# Patient Record
Sex: Female | Born: 1975 | Race: Black or African American | Hispanic: No | Marital: Single | Smoking: Never smoker
Health system: Southern US, Community
[De-identification: ages and names within clinical notes are randomized; demographics above are authoritative.]

## PROBLEM LIST (undated history)

## (undated) DIAGNOSIS — E559 Vitamin D deficiency, unspecified: Secondary | ICD-10-CM

## (undated) DIAGNOSIS — K429 Umbilical hernia without obstruction or gangrene: Secondary | ICD-10-CM

## (undated) DIAGNOSIS — F329 Major depressive disorder, single episode, unspecified: Secondary | ICD-10-CM

## (undated) DIAGNOSIS — R87619 Unspecified abnormal cytological findings in specimens from cervix uteri: Secondary | ICD-10-CM

## (undated) DIAGNOSIS — R7303 Prediabetes: Secondary | ICD-10-CM

## (undated) DIAGNOSIS — R51 Headache: Secondary | ICD-10-CM

## (undated) DIAGNOSIS — N841 Polyp of cervix uteri: Secondary | ICD-10-CM

## (undated) DIAGNOSIS — G43909 Migraine, unspecified, not intractable, without status migrainosus: Secondary | ICD-10-CM

## (undated) DIAGNOSIS — F32A Depression, unspecified: Secondary | ICD-10-CM

## (undated) DIAGNOSIS — O1490 Unspecified pre-eclampsia, unspecified trimester: Secondary | ICD-10-CM

## (undated) DIAGNOSIS — R569 Unspecified convulsions: Secondary | ICD-10-CM

## (undated) DIAGNOSIS — IMO0002 Reserved for concepts with insufficient information to code with codable children: Secondary | ICD-10-CM

## (undated) DIAGNOSIS — N390 Urinary tract infection, site not specified: Secondary | ICD-10-CM

## (undated) HISTORY — PX: TUBAL LIGATION: SHX77

## (undated) HISTORY — DX: Depression, unspecified: F32.A

## (undated) HISTORY — DX: Major depressive disorder, single episode, unspecified: F32.9

## (undated) HISTORY — DX: Prediabetes: R73.03

## (undated) HISTORY — DX: Morbid (severe) obesity due to excess calories: E66.01

## (undated) HISTORY — DX: Migraine, unspecified, not intractable, without status migrainosus: G43.909

## (undated) HISTORY — PX: CRYOTHERAPY: SHX1416

## (undated) HISTORY — DX: Vitamin D deficiency, unspecified: E55.9

## (undated) HISTORY — PX: ENDOMETRIAL ABLATION: SHX621

## (undated) HISTORY — PX: WISDOM TOOTH EXTRACTION: SHX21

---
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
## 2000-05-11 ENCOUNTER — Encounter: Payer: Self-pay | Admitting: *Deleted

## 2000-05-11 ENCOUNTER — Ambulatory Visit (HOSPITAL_COMMUNITY): Admission: RE | Admit: 2000-05-11 | Discharge: 2000-05-11 | Payer: Self-pay | Admitting: *Deleted

## 2000-07-12 ENCOUNTER — Inpatient Hospital Stay (HOSPITAL_COMMUNITY): Admission: AD | Admit: 2000-07-12 | Discharge: 2000-07-20 | Payer: Self-pay | Admitting: *Deleted

## 2000-07-12 ENCOUNTER — Encounter (INDEPENDENT_AMBULATORY_CARE_PROVIDER_SITE_OTHER): Payer: Self-pay

## 2000-07-12 HISTORY — PX: TUBAL LIGATION: SHX77

## 2000-07-13 ENCOUNTER — Encounter: Payer: Self-pay | Admitting: *Deleted

## 2000-08-17 ENCOUNTER — Inpatient Hospital Stay (HOSPITAL_COMMUNITY): Admission: AD | Admit: 2000-08-17 | Discharge: 2000-08-17 | Payer: Self-pay | Admitting: *Deleted

## 2000-09-29 ENCOUNTER — Ambulatory Visit (HOSPITAL_COMMUNITY): Admission: RE | Admit: 2000-09-29 | Discharge: 2000-09-29 | Payer: Self-pay | Admitting: *Deleted

## 2001-01-09 ENCOUNTER — Emergency Department (HOSPITAL_COMMUNITY): Admission: EM | Admit: 2001-01-09 | Discharge: 2001-01-09 | Payer: Self-pay | Admitting: Emergency Medicine

## 2001-05-25 ENCOUNTER — Emergency Department (HOSPITAL_COMMUNITY): Admission: EM | Admit: 2001-05-25 | Discharge: 2001-05-25 | Payer: Self-pay | Admitting: Emergency Medicine

## 2001-05-25 ENCOUNTER — Encounter: Payer: Self-pay | Admitting: Emergency Medicine

## 2001-08-10 ENCOUNTER — Other Ambulatory Visit: Admission: RE | Admit: 2001-08-10 | Discharge: 2001-08-10 | Payer: Self-pay | Admitting: *Deleted

## 2002-07-30 ENCOUNTER — Other Ambulatory Visit: Admission: RE | Admit: 2002-07-30 | Discharge: 2002-07-30 | Payer: Self-pay | Admitting: Family Medicine

## 2002-09-21 ENCOUNTER — Emergency Department (HOSPITAL_COMMUNITY): Admission: EM | Admit: 2002-09-21 | Discharge: 2002-09-21 | Payer: Self-pay | Admitting: Emergency Medicine

## 2002-09-21 ENCOUNTER — Encounter: Payer: Self-pay | Admitting: Emergency Medicine

## 2004-08-27 ENCOUNTER — Emergency Department (HOSPITAL_COMMUNITY): Admission: EM | Admit: 2004-08-27 | Discharge: 2004-08-27 | Payer: Self-pay | Admitting: Family Medicine

## 2004-10-09 ENCOUNTER — Emergency Department (HOSPITAL_COMMUNITY): Admission: EM | Admit: 2004-10-09 | Discharge: 2004-10-09 | Payer: Self-pay | Admitting: Family Medicine

## 2004-10-09 ENCOUNTER — Ambulatory Visit (HOSPITAL_COMMUNITY): Admission: RE | Admit: 2004-10-09 | Discharge: 2004-10-09 | Payer: Self-pay | Admitting: Family Medicine

## 2005-04-27 ENCOUNTER — Emergency Department (HOSPITAL_COMMUNITY): Admission: EM | Admit: 2005-04-27 | Discharge: 2005-04-27 | Payer: Self-pay | Admitting: Family Medicine

## 2005-07-12 HISTORY — PX: WISDOM TOOTH EXTRACTION: SHX21

## 2006-07-06 ENCOUNTER — Encounter: Admission: RE | Admit: 2006-07-06 | Discharge: 2006-07-06 | Payer: Self-pay | Admitting: Family Medicine

## 2006-08-04 ENCOUNTER — Encounter: Admission: RE | Admit: 2006-08-04 | Discharge: 2006-08-04 | Payer: Self-pay | Admitting: Family Medicine

## 2006-09-23 ENCOUNTER — Ambulatory Visit (HOSPITAL_COMMUNITY): Admission: RE | Admit: 2006-09-23 | Discharge: 2006-09-23 | Payer: Self-pay | Admitting: Obstetrics and Gynecology

## 2006-09-23 ENCOUNTER — Encounter (INDEPENDENT_AMBULATORY_CARE_PROVIDER_SITE_OTHER): Payer: Self-pay | Admitting: Specialist

## 2007-06-02 IMAGING — US US ABDOMEN COMPLETE
1 series · 14 of 25 positions shown · non-contrast
Comparison: none

CLINICAL DATA: Upper abdominal bloating.  
ABDOMEN ULTRASOUND:
TECHNIQUE: Complete abdominal ultrasound examination was performed including evaluation of the liver, gallbladder, bile ducts, pancreas, kidneys, spleen, IVC, and abdominal aorta.
No gallstones.  Gallbladder wall measures 1.6 mm in thickness.  Common duct measures 2.8 mm which is well within normal limits.  Mild diffuse increase in diffuse hepatic echogenicity is compatible with diffuse hepatocellular disease, for example, fatty infiltration.  Pancreas unremarkable.  Pancreatic duct measures 1.8 mm in diameter.  Spleen and kidneys appear unremarkable.  Spleen measures 4.2 cm x 7.8 cm in dimension.  Right and left kidneys measure 11.5 cm and 10.6 cm in length, respectively.  Patent IVC.  Abdominal aorta maximum diameter is 1.9 cm.

[Series 1: us abdomen complete · 0.29mm/px · 14 of 70 slices shown]
[im 1/70]
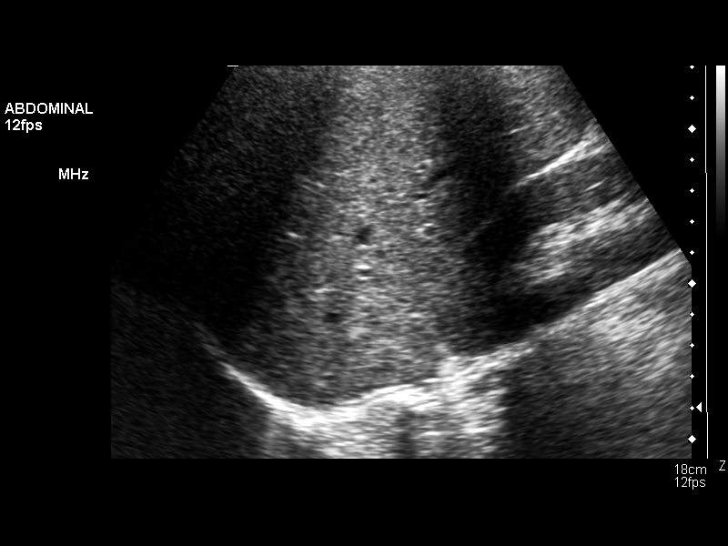
[im 6/70]
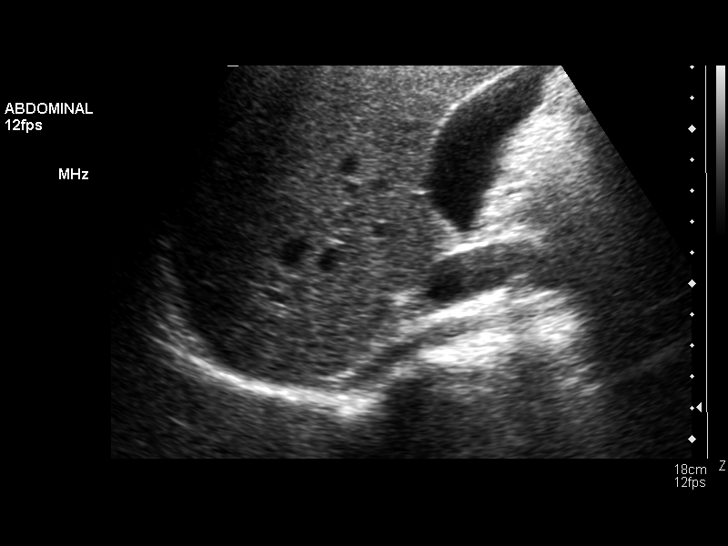
[im 12/70]
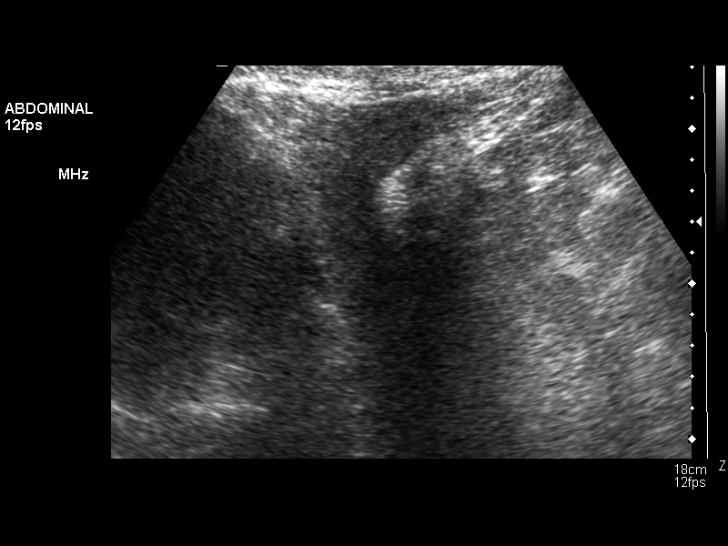
[im 18/70]
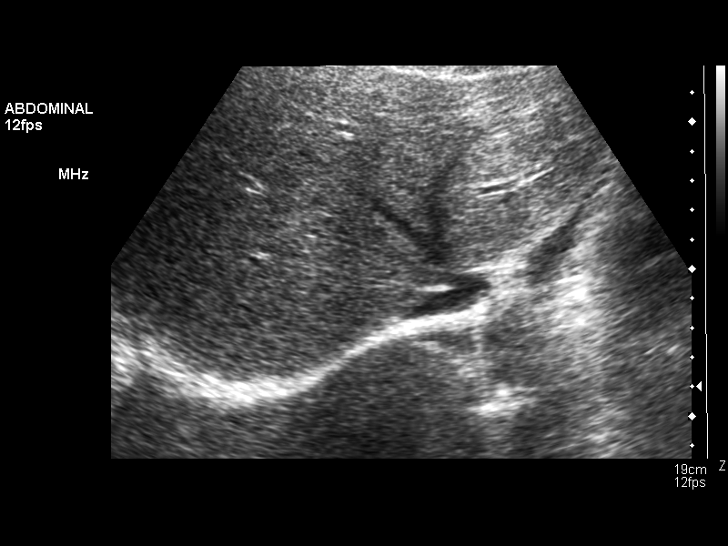
[im 24/70]
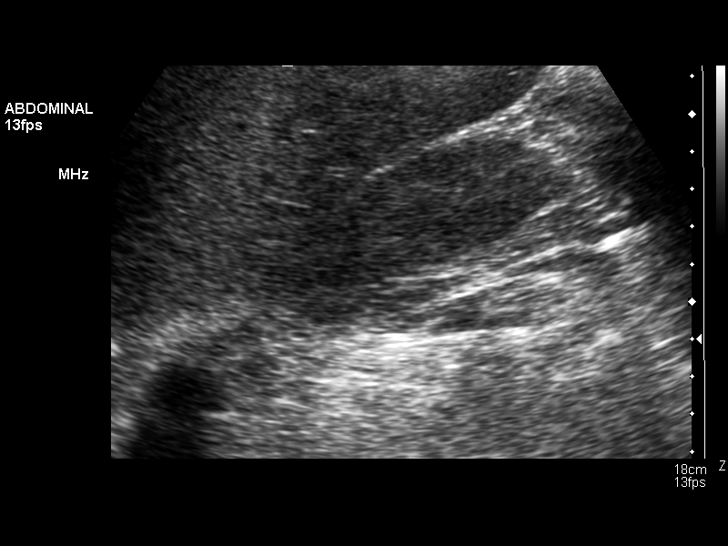
[im 26/70]
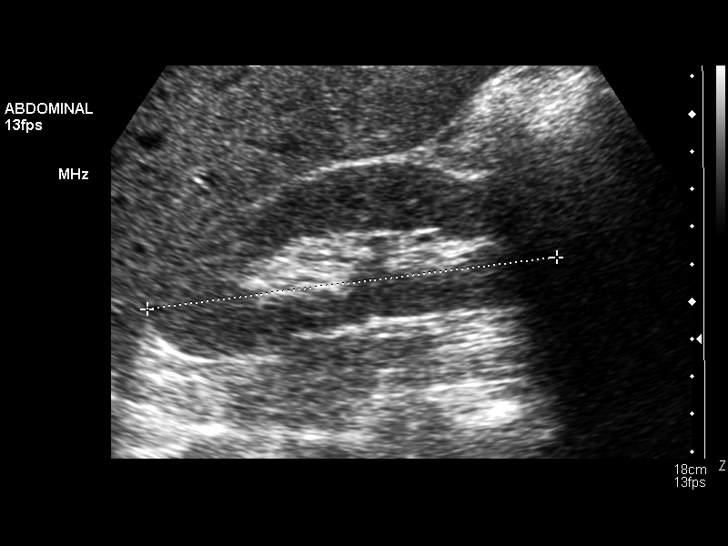
[im 32/70]
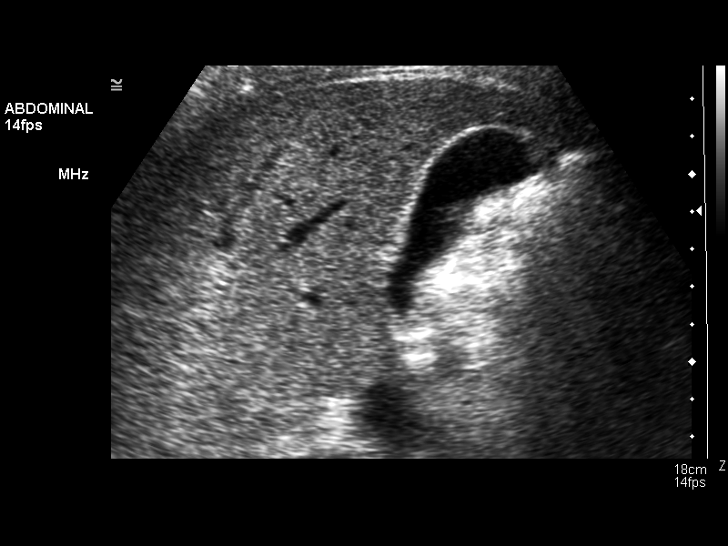
[im 38/70]
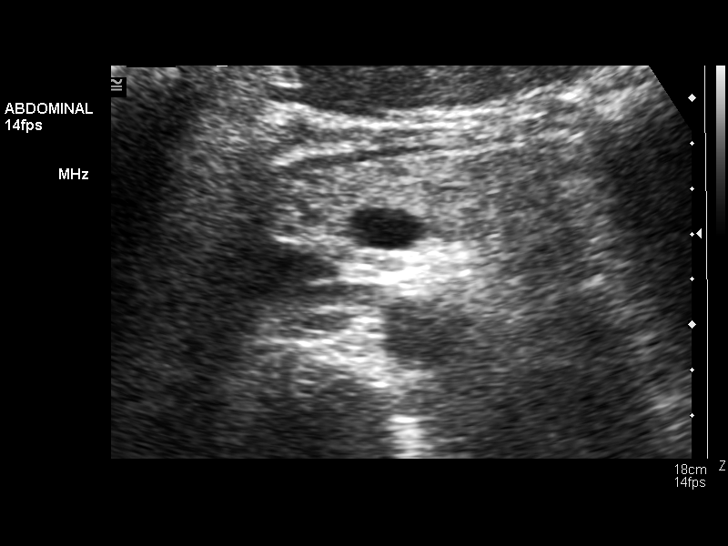
[im 44/70]
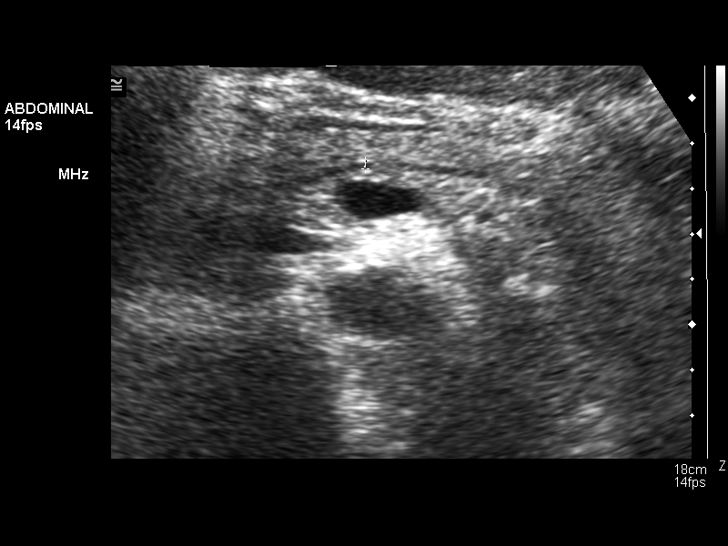
[im 47/70]
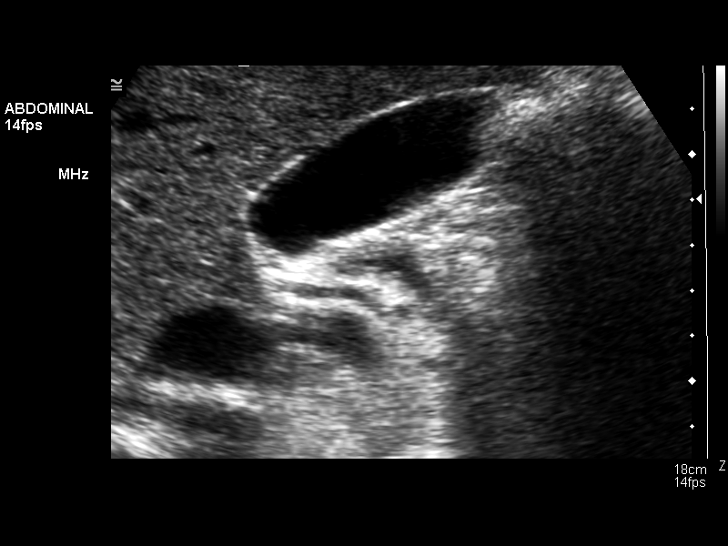
[im 52/70]
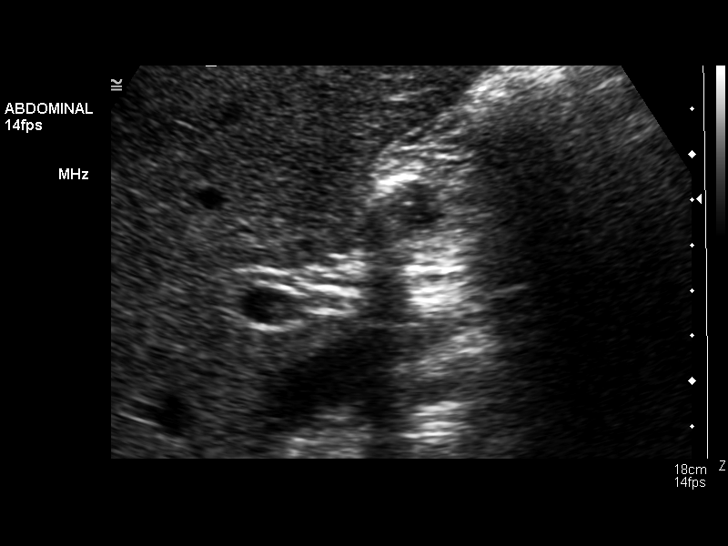
[im 58/70]
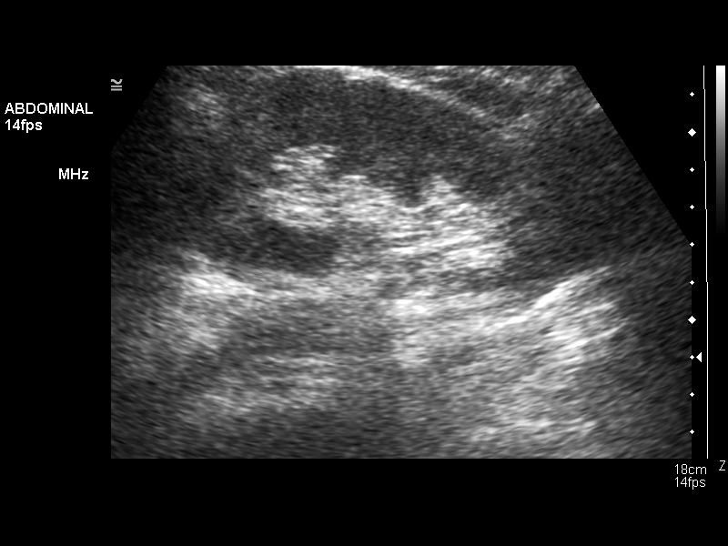
[im 64/70]
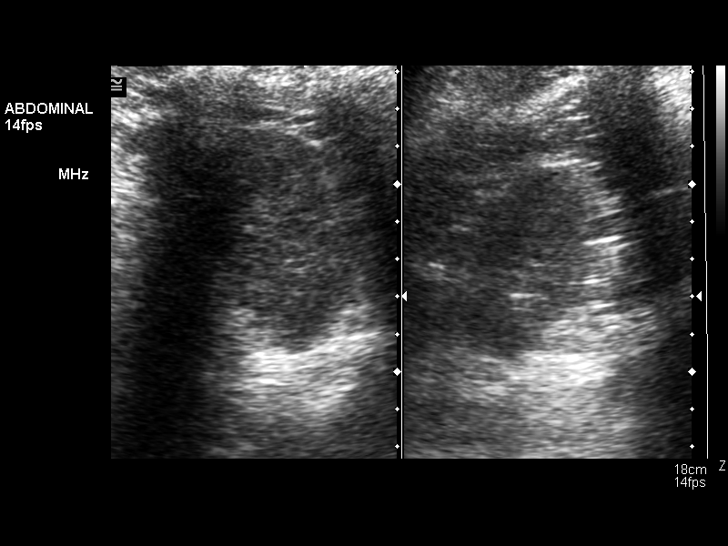
[im 70/70]
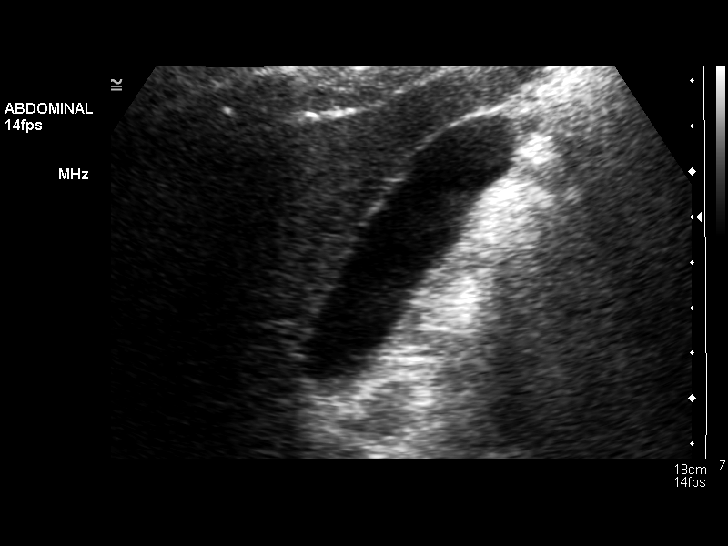

[14 of 25 positions shown; findings below may reference images not displayed]

IMPRESSION: Normal gallbladder.  No biliary ductal dilatation.  Findings compatible with mild diffuse hepatocellular disease, likely fatty infiltration.

## 2008-06-12 ENCOUNTER — Emergency Department (HOSPITAL_COMMUNITY): Admission: EM | Admit: 2008-06-12 | Discharge: 2008-06-12 | Payer: Self-pay | Admitting: Family Medicine

## 2008-10-01 ENCOUNTER — Other Ambulatory Visit: Admission: RE | Admit: 2008-10-01 | Discharge: 2008-10-01 | Payer: Self-pay | Admitting: Family Medicine

## 2010-04-22 ENCOUNTER — Other Ambulatory Visit: Admission: RE | Admit: 2010-04-22 | Discharge: 2010-04-22 | Payer: Self-pay | Admitting: Family Medicine

## 2010-05-15 ENCOUNTER — Emergency Department (HOSPITAL_COMMUNITY): Admission: EM | Admit: 2010-05-15 | Discharge: 2010-05-15 | Payer: Self-pay | Admitting: Emergency Medicine

## 2010-08-01 ENCOUNTER — Encounter: Payer: Self-pay | Admitting: *Deleted

## 2010-11-27 NOTE — H&P (Signed)
[facility]  Patient:    Kendra Stein, Kendra Stein                  MRN: [id number] Adm. Date:  [id number] Attending:  Deniece Ree                         History and Physical  CHIEF COMPLAINT:              The patient is a 35 year old gravida 2 para 2, status post cesarean section x 2, being admitted at this time as an outpatient for tubal sterilization.  HISTORY OF PRESENT ILLNESS:   The patient understands the different types of contraceptives available and does not wish to have any further pregnancies. She understands that this procedure is intended to be permanent; however, cannot be guaranteed.  PAST MEDICAL HISTORY:         The patient has had two previous cesarean sections.  PHYSICAL EXAMINATION:  GENERAL:                      The patient is a well-developed, well-nourished female in no acute distress.  HEENT:                        Within normal limits.  NECK:                         Supple.  BREAST:                       Without masses, tenderness, or discharge.  LUNGS:                        Clear to auscultation and percussion.  HEART:                        Normal sinus rhythm without murmurs, rubs, or gallops.  ABDOMEN:                      Obese but benign.  EXTREMITIES:                  Within normal limits.  NEUROLOGIC:                   Within normal limits.  PELVIC:                       External genitalia and BUS within normal limits. Vagina clear.  Cervix firm and nontender.  Uterus approximately six to eight week size, posterior.  Adnexa benign.  DIAGNOSIS:                    Multiparity, desires permanent sterilization.  PLAN:                         The plan is for laparoscopic bilateral tubal cauterization with tubal resection. DD:  09/29/00 TD:  09/30/00 Job: [id number]

## 2010-11-27 NOTE — Discharge Summary (Signed)
[facility]  Patient:    Kendra Stein, Kendra Stein                  MRN: [id number] Adm. Date:  [id number] Disc. Date: [id number] Attending:  Deniece Ree                           Discharge Summary  DISCHARGE SUMMARY:            The patient is a 35 year old gravida 3, para 1, abortus 1, who is approximately [redacted] weeks gestation who was admitted for having pregnancy induced hypertension. At the time of admission, the patient had a mild case of PIH and was allowed to be admitted at bed rest. Over the ensuing days, the patients symptoms worsened and on July 15, 2000, due to worsening of PIH laboratories in the patient, she was scheduled for a repeat cesarean section. The patient underwent a repeat cesarean section on January 4 which she tolerated the procedure very well without any problems.  Postoperatively, the patients blood pressure remained elevated 120/90-100 at during which time she remained on the magnesium sulfate. On January 7, the patient continued to make very slow progress; however, developed a temperature of 101 on January 6. On January 7, the patient began to make a real turnaround, feeling much better, and was improving. With continuous improvement on January 9, the patient was discharged. She was instructed on the possible complications and care following this type of admission and cesarean section. She was told to return to my office in one month for followup evaluation or to call me prior to that time should any problems arise. DD:  08/10/00 TD:  08/10/00 Job: [id number]

## 2010-11-27 NOTE — Op Note (Signed)
NAMEMarland Stein  ARISBETH, PURRINGTON NO.:  [id number]   MEDICAL RECORD NO.:  [id number]          PATIENT TYPE:  AMB   LOCATION:  SDC                           FACILITY:  [facility]   PHYSICIAN:  Osborn Coho, M.D.   DATE OF BIRTH:  Nov 21, 1975   DATE OF PROCEDURE:  09/23/2006  DATE OF DISCHARGE:                               OPERATIVE REPORT   PREOPERATIVE DIAGNOSES:  1. Menometrorrhagia.  2. Fibroid.   POSTOPERATIVE DIAGNOSES:  1. Menometrorrhagia.  2. Fibroid.   PROCEDURES:  1. Hysteroscopy.  2. Dilation and curettage.  3. Ablation via NovaSure.   ATTENDING:  Osborn Coho, M.D.   ANESTHESIA:  General.   SPECIMENS TO PATHOLOGY:  Endometrial curettings.   FLUIDS:  1200 mL.   URINE OUTPUT:  Quantity sufficient via straight catheterization prior to  procedure.   HYSTEROSCOPIC FLUID DEFICIT:  100 mL.   ESTIMATED BLOOD LOSS:  Minimal.   Cavity length 3.5 cm.  Uterus sounded to 8.5 cm.  Cervical length 5.0 cm.  Cavity width 2.5 cm.  Power 55 watts.  Time of ablation 1 minute 30 seconds.   COMPLICATIONS:  None.   PROCEDURE:  The patient was taken to the operating room after the risks,  benefits and alternatives were reviewed with the patient, the patient  verbalized understanding and consent signed and witnessed.  The patient  was placed under general per anesthesia and prepped and draped in the  normal sterile fashion.  A bivalve speculum was placed in the patient's  vagina and the anterior lip of the cervix grasped with a single-tooth  tenaculum.  The cervix was dilated for passage of the diagnostic  hysteroscope and measurements obtained.  The diagnostic hysteroscope  introduced and a very small fibroid noted on posterior wall.  The  fibroid was almost flush with the endometrium.  Curettage was performed  and the curettings sent to pathology.  The uterus was then dilated for  passage of the NovaSure instrument.  The NovaSure was placed into the  endometrial  cavity and ablation performed.  All instruments were removed  after diagnostic hysteroscope introduced once again and good endometrial  ablation results noted.  There was good hemostasis at the tenaculum  sites.  The count was correct.  The patient tolerated the procedure well  was returned to the recovery room in good condition.      Osborn Coho, M.D.  Electronically Signed     AR/MEDQ  D:  09/23/2006  T:  09/23/2006  Job:  [id number]

## 2010-11-27 NOTE — Op Note (Signed)
[facility]  Patient:    Kendra Stein, Kendra Stein                  MRN: [id number] Proc. Date: 07/15/00 Adm. Date:  [id number] Attending:  Deniece Ree                           Operative Report  PREOPERATIVE DIAGNOSIS:        Intrauterine pregnancy at 32 weeks with pregnancy induced hypertension which is deteriorating.  POSTOPERATIVE DIAGNOSIS:       Intrauterine pregnancy at 32 weeks with pregnancy induced hypertension which is deteriorating.  Viable female infant with Apgar of 5 and 9.  OPERATION:                     Cesarean section.  SURGEON:                       Deniece Ree, M.D.  ANESTHESIA:                    Spinal.  ANESTHESIOLOGIST:              Ellison Hughs., M.D.  PEDIATRICS:                    Dr. Katrinka Blazing.  ESTIMATED BLOOD LOSS:          400 cc.  DRAINS:                        Foley left to straight drainage.                                 The patient tolerated the procedure well and returned to the recovery room in satisfactory condition.  DESCRIPTION OF PROCEDURE:      The patient was taken to the operating room, prepped and draped in the usual fashion for repeat cesarean section.  A low Pfannenstiel incision was made following the course of the previous incision. This was carried down to the fascia at which time the fascia was entered and excised the extent of the incision.  The midline was identified.  The rectus muscles were separated.  The abdominal peritoneum was then entered in a vertical fashion using Metzenbaum scissors.  It was noted at this time that a massive amount of pelvic adhesions was present.  These were bluntly and sharply dissected away in order to observe the pelvic anatomy.  The uterine peritoneum was then excised bilaterally toward the round ligaments following which the lower uterine segment was scored, entered in the midline and bluntly dissected open.  Initially in delivery the infant  was in a vertex position; however, with one push quickly turned to a transverse lie and became very fundal in its position.  With moderate amount of manipulation and twisting, the infants head was then then reconverted to be vertex.  Delivery was then carried out without any problems.  The nasal and pharynx was then suctioned out with the suction bulb.  The cord was clamped.  The infant turned over to the pediatricians who were in attendance.  Cord blood was then obtained which returned with a pH of 7.24.  The placenta as well as all products of conception were then manually removed from the uterine cavity.  IV Pitocin as well  as IV antibiotics were then begun.   In the interim and during the delivery, a vertical incision was carried out extending incisions to the uterine fundus in order to turn the infant.  After this was done delivery was carried out.  The myometrium was then closed using 31 chromic in interrupted stitch followed by imbricating stitch again using #1 chromic.  ____________ was then carried out using 2-0 chromic in a running stitch.  Sponge and needle counts were correct x 2.  At this point abdominal peritoneum was then closed with 2-0 chromic in a running stitch followed by closure of the fascia using #1 Vicryl in a running stitch.  Skin was closed with skin staples.  Procedure terminated.  Patient tolerated the procedure well and returned to the recovery room in satisfactory condition. DD:  07/15/00 TD:  07/15/00 Job: [id number]

## 2010-11-27 NOTE — H&P (Signed)
[facility]  Patient:    Kendra Stein, Kendra Stein                  MRN: [id number] Adm. Date:  [id number] Attending:  Deniece Ree                         History and Physical  HISTORY OF PRESENT ILLNESS:   The patient is a 35 year old, gravida 3, para 1, abortus 1, approximately [redacted] weeks gestation who was admitted for having pregnancy-induced hypertension.  At the time of admission, the patient had a mild case of PIH and was allowed to be admitted at bed rest.  Over the ensuing three days, the patients symptoms worsened and became much worse on July 15, 2000, with all of her [facility] laboratories increasing, blood pressure increasing, and beginning to have severe epigastric pain.  It was decided at this point, that the patient would indeed undergo a repeat cesarean section.  PAST MEDICAL HISTORY:         Significant in that the patient did have a previous cesarean section for fetal distress.  ALLERGIES:                    No known drug allergies.  PHYSICAL EXAMINATION:  GENERAL:                      Revealed a well-developed, well-nourished female in abdominal distress.  HEENT:                        Within normal limits.  NECK:                         Supple.  BREASTS:                      Without masses, tenderness, or discharge.  LUNGS:                        Clear to auscultation and percussion.  HEART:                        Normal sinus rhythm without murmurs, rubs, or gallops.  ABDOMEN:                      Approximately [redacted] weeks gestational size with good fetal heart tones.  EXTREMITIES: NEUROLOGICAL:    Reveal deep tendon reflexes 3+.  Otherwise within normal limits.  PELVIC:                       Revealed a cervix that is closed, posterior, and no effacement.  Infant at least -3 station.  The decision was to proceed with a repeat cesarean section in that the cervix is not inducable and felt that time was important in delivery of  the infant.  ADMISSION DIAGNOSIS:          Intrauterine pregnancy at approximately 32 weeks with pregnancy-induced hypertension.  PLAN:                         Repeat cesarean section. DD:  07/15/00 TD:  07/15/00 Job: [id number]

## 2010-11-27 NOTE — Op Note (Signed)
[facility]  Patient:    Kendra Stein, Kendra Stein                  MRN: [id number] Proc. Date: 09/29/00 Adm. Date:  [id number] Attending:  Deniece Ree                           Operative Report  PREOPERATIVE DIAGNOSIS:       Multiparity, desires permanent                               sterilization.  POSTOPERATIVE DIAGNOSIS:      Multiparity, desires permanent                               sterilization.  PROCEDURE:                    Laparoscopic bilateral tubal cauterization with tubal resection.  SURGEON:                      Deniece Ree, M.D.  ANESTHESIA:                   General.  ESTIMATED BLOOD LOSS:         25 cc.  COMPLICATIONS:                None.  The patient tolerated the procedure well and returned to the recovery room in satisfactory condition.  PROCEDURE:                    The patient was taken to the operating room, prepped and draped in the usual fashion for laparoscopic surgery.  Speculum was placed in the vagina, following which the anterior lip of the cervix was then grasped with a Christella Hartigan tenaculum.  The acorn uterine manipulated cannula then put into place.  A subumbilical incision was then made, following which the Veress needle was introduced in this incision and approximately 3 L of carbon dioxide was then infused without any difficulty.  The laparoscopic trocar was placed in this incision, following with the laparoscope placed with a sleeve.  Visualization of the pelvic organs came into view.  The left tube, with some difficulty, was identified, followed out to the fimbriae, ______ identified.  It was then grasped approximately 5 mm proximate to the left cornu and cauterized approximately 3-4 cm in length.  The right side was also done likewise with difficulty identifying, which was carried out.  It was then cauterized.  After cauterization, the tubes were then cut into two locations each.  At this point,  hemostasis was present.  Sponge and needle count was correct x 2.  The carbon dioxide was then allowed to escape from the abdominal cavity, which it did so without any problems.  The incision was then closed, first with a deep interrupted stitch followed by a subcuticular stitch using 4-0 Vicryl.  The procedure terminated.  The patient tolerated the procedure well and returned to the recovery room in satisfactory condition. DD:  09/29/00 TD:  09/30/00 Job: [id number]

## 2010-12-05 ENCOUNTER — Inpatient Hospital Stay (HOSPITAL_COMMUNITY)
Admission: AD | Admit: 2010-12-05 | Discharge: 2010-12-05 | Disposition: A | Discharge status: Home / Self Care | Payer: Self-pay | Source: Ambulatory Visit | Attending: Obstetrics and Gynecology | Admitting: Obstetrics and Gynecology

## 2010-12-05 DIAGNOSIS — N751 Abscess of Bartholin's gland: Secondary | ICD-10-CM | POA: Insufficient documentation

## 2011-04-16 LAB — WET PREP, GENITAL
WBC, Wet Prep HPF POC: NONE SEEN
Yeast Wet Prep HPF POC: NONE SEEN

## 2011-04-16 LAB — POCT URINALYSIS DIP (DEVICE)
Ketones, ur: NEGATIVE mg/dL
Urobilinogen, UA: 1 mg/dL (ref 0.0–1.0)

## 2011-07-18 ENCOUNTER — Encounter: Payer: Self-pay | Admitting: *Deleted

## 2011-07-18 ENCOUNTER — Emergency Department (INDEPENDENT_AMBULATORY_CARE_PROVIDER_SITE_OTHER)
Admission: EM | Admit: 2011-07-18 | Discharge: 2011-07-18 | Disposition: A | Discharge status: Home / Self Care | Payer: BC Managed Care – PPO | Source: Home / Self Care | Attending: Family Medicine | Admitting: Family Medicine

## 2011-07-18 DIAGNOSIS — W57XXXA Bitten or stung by nonvenomous insect and other nonvenomous arthropods, initial encounter: Secondary | ICD-10-CM

## 2011-07-18 DIAGNOSIS — T148 Other injury of unspecified body region: Secondary | ICD-10-CM

## 2011-07-18 MED ORDER — FLUTICASONE PROPIONATE 0.05 % EX CREA: TOPICAL_CREAM | Freq: Two times a day (BID) | CUTANEOUS | Status: DC

## 2011-07-18 NOTE — ED Provider Notes (Signed)
History     CSN: [id number]  Arrival date & time 07/18/11  1058   First MD Initiated Contact with Patient 07/18/11 1101      Chief Complaint  Patient presents with  . Rash    (Consider location/radiation/quality/duration/timing/severity/associated sxs/prior treatment) Patient is a 36 y.o. female presenting with rash. The history is provided by the patient.  Rash  This is a new problem. The current episode started more than 1 week ago (found out they had bed bugs in new home. ). The problem has been gradually worsening. The problem is associated with an insect bite/sting. There has been no fever. The rash is present on the right arm. The patient is experiencing no pain. The pain has been fluctuating since onset. Associated symptoms include itching. She has tried anti-itch cream for the symptoms. The treatment provided mild relief.    History reviewed. No pertinent past medical history.  History reviewed. No pertinent past surgical history.  History reviewed. No pertinent family history.  History  Substance Use Topics  . Smoking status: Not on file  . Smokeless tobacco: Not on file  . Alcohol Use: Yes    OB History    Grav Para Term Preterm Abortions TAB SAB Ect Mult Living                  Review of Systems  Constitutional: Negative.   HENT: Negative.   Skin: Positive for itching and rash.    Allergies  Review of patient's allergies indicates no known allergies.  Home Medications   Current Outpatient Rx  Name Route Sig Dispense Refill  . FLUTICASONE PROPIONATE 0.05 % EX CREA Topical Apply topically 2 (two) times daily. 30 g 0    BP 119/92  Pulse 94  Temp(Src) 97.2 F (36.2 C) (Oral)  Resp 17  SpO2 100%  Physical Exam  Nursing note and vitals reviewed. Constitutional: She appears well-developed and well-nourished.  Skin: Skin is warm and dry. Rash noted.       ED Course  Procedures (including critical care time)  Labs Reviewed - No data to  display No results found.   1. Multiple insect bites       MDM          Barkley Bruns, MD 07/18/11 1140

## 2011-07-18 NOTE — ED Notes (Signed)
Per pt had bites on right upper arm which are resolving now with blistered rash right anterior upper arm

## 2011-11-30 ENCOUNTER — Inpatient Hospital Stay (HOSPITAL_COMMUNITY): Payer: BC Managed Care – PPO

## 2011-11-30 ENCOUNTER — Encounter (HOSPITAL_COMMUNITY): Payer: Self-pay

## 2011-11-30 ENCOUNTER — Inpatient Hospital Stay (HOSPITAL_COMMUNITY)
Admission: AD | Admit: 2011-11-30 | Discharge: 2011-11-30 | Disposition: A | Discharge status: Home / Self Care | Payer: BC Managed Care – PPO | Source: Ambulatory Visit | Attending: Obstetrics and Gynecology | Admitting: Obstetrics and Gynecology

## 2011-11-30 DIAGNOSIS — N390 Urinary tract infection, site not specified: Secondary | ICD-10-CM | POA: Insufficient documentation

## 2011-11-30 DIAGNOSIS — N949 Unspecified condition associated with female genital organs and menstrual cycle: Secondary | ICD-10-CM

## 2011-11-30 DIAGNOSIS — E669 Obesity, unspecified: Secondary | ICD-10-CM

## 2011-11-30 DIAGNOSIS — R109 Unspecified abdominal pain: Secondary | ICD-10-CM | POA: Insufficient documentation

## 2011-11-30 HISTORY — DX: Polyp of cervix uteri: N84.1

## 2011-11-30 HISTORY — DX: Headache: R51

## 2011-11-30 HISTORY — DX: Unspecified abnormal cytological findings in specimens from cervix uteri: R87.619

## 2011-11-30 HISTORY — DX: Reserved for concepts with insufficient information to code with codable children: IMO0002

## 2011-11-30 HISTORY — DX: Unspecified pre-eclampsia, unspecified trimester: O14.90

## 2011-11-30 LAB — URINALYSIS, ROUTINE W REFLEX MICROSCOPIC
Glucose, UA: NEGATIVE mg/dL
Leukocytes, UA: NEGATIVE
Nitrite: POSITIVE — AB
Protein, ur: NEGATIVE mg/dL
Specific Gravity, Urine: 1.01 (ref 1.005–1.030)
Urobilinogen, UA: 0.2 mg/dL (ref 0.0–1.0)
pH: 8 (ref 5.0–8.0)

## 2011-11-30 LAB — COMPREHENSIVE METABOLIC PANEL
AST: 16 U/L (ref 0–37)
Alkaline Phosphatase: 69 U/L (ref 39–117)
CO2: 25 mEq/L (ref 19–32)
Chloride: 102 mEq/L (ref 96–112)
GFR calc Af Amer: >90 mL/min (ref >90)
Glucose, Bld: 100 mg/dL — ABNORMAL HIGH (ref 70–99)
Potassium: 3.9 mEq/L (ref 3.5–5.1)
Total Protein: 7 g/dL (ref 6.0–8.3)

## 2011-11-30 LAB — POCT PREGNANCY, URINE: Preg Test, Ur: NEGATIVE

## 2011-11-30 LAB — URINE MICROSCOPIC-ADD ON

## 2011-11-30 LAB — WET PREP, GENITAL: Yeast Wet Prep HPF POC: NONE SEEN

## 2011-11-30 MED ORDER — PHENAZOPYRIDINE HCL 100 MG PO TABS: 100.0000 mg | ORAL_TABLET | Freq: Three times a day (TID) | ORAL | Status: AC | PRN

## 2011-11-30 MED ORDER — PROMETHAZINE HCL 25 MG PO TABS
25.0000 mg | ORAL_TABLET | Freq: Once | ORAL | Status: AC
  Administered 2011-11-30: 25 mg via ORAL
  Filled 2011-11-30: qty 1

## 2011-11-30 MED ORDER — IBUPROFEN 800 MG PO TABS: 800.0000 mg | ORAL_TABLET | Freq: Three times a day (TID) | ORAL | Status: AC | PRN

## 2011-11-30 MED ORDER — MAGNESIUM HYDROXIDE 400 MG/5ML PO SUSP
30.0000 mL | Freq: Once | ORAL | Status: AC
  Administered 2011-11-30: 30 mL via ORAL
  Filled 2011-11-30: qty 30

## 2011-11-30 MED ORDER — SULFAMETHOXAZOLE-TRIMETHOPRIM 800-160 MG PO TABS: 1.0000 | ORAL_TABLET | Freq: Two times a day (BID) | ORAL | Status: AC

## 2011-11-30 NOTE — Discharge Instructions (Signed)

## 2011-11-30 NOTE — MAU Note (Signed)
Pt states at 0705 had sudden sharp upper/mid abdominal pain. Worse with standing or movement. Pain began in lower abdomen, felt like contractions. Now pain moreso on r side of umbilicus. Denies vag d/c changes or bleeding. Stomach feels bloated. Feels like she is constipated, but has been having regular bowel movements.

## 2011-11-30 NOTE — MAU Note (Signed)
Pt states nausea mild now. No adverse effect.

## 2011-12-02 LAB — URINE CULTURE: Culture  Setup Time: 201305212354

## 2011-12-07 NOTE — Progress Notes (Signed)
Quick Note:  Pt prescribed Septra DS for presumptive UTI when in MAU. ______

## 2013-04-24 ENCOUNTER — Other Ambulatory Visit (HOSPITAL_COMMUNITY): Payer: Self-pay | Admitting: Physician Assistant

## 2013-04-24 ENCOUNTER — Ambulatory Visit (HOSPITAL_COMMUNITY)
Admission: RE | Admit: 2013-04-24 | Discharge: 2013-04-24 | Disposition: A | Discharge status: Home / Self Care | Payer: BC Managed Care – PPO | Source: Ambulatory Visit | Attending: Physician Assistant | Admitting: Physician Assistant

## 2013-04-24 DIAGNOSIS — M51379 Other intervertebral disc degeneration, lumbosacral region without mention of lumbar back pain or lower extremity pain: Secondary | ICD-10-CM | POA: Insufficient documentation

## 2013-04-24 DIAGNOSIS — M545 Low back pain, unspecified: Secondary | ICD-10-CM | POA: Insufficient documentation

## 2013-04-24 DIAGNOSIS — S838X9A Sprain of other specified parts of unspecified knee, initial encounter: Secondary | ICD-10-CM

## 2013-04-24 DIAGNOSIS — M25569 Pain in unspecified knee: Secondary | ICD-10-CM | POA: Insufficient documentation

## 2013-04-24 DIAGNOSIS — M5137 Other intervertebral disc degeneration, lumbosacral region: Secondary | ICD-10-CM | POA: Insufficient documentation

## 2013-04-24 DIAGNOSIS — M5380 Other specified dorsopathies, site unspecified: Secondary | ICD-10-CM | POA: Insufficient documentation

## 2013-04-24 DIAGNOSIS — M47817 Spondylosis without myelopathy or radiculopathy, lumbosacral region: Secondary | ICD-10-CM | POA: Insufficient documentation

## 2013-05-17 ENCOUNTER — Telehealth: Payer: Self-pay

## 2013-05-17 NOTE — Telephone Encounter (Signed)
Message copied by Linwood Dibbles on Thu May 17, 2013  4:28 PM ------      Message from: Quentin Mulling R      Created: Wed May 16, 2013  6:11 PM       Let patient know that knee xray is unremarkable however her back has disc space narrowing and can be the source of her pain. We can refer her to ortho if she would like. ------

## 2013-05-23 ENCOUNTER — Encounter: Payer: Self-pay | Admitting: Internal Medicine

## 2013-05-23 DIAGNOSIS — O1490 Unspecified pre-eclampsia, unspecified trimester: Secondary | ICD-10-CM | POA: Insufficient documentation

## 2013-05-23 DIAGNOSIS — G43909 Migraine, unspecified, not intractable, without status migrainosus: Secondary | ICD-10-CM | POA: Insufficient documentation

## 2013-05-25 ENCOUNTER — Encounter: Payer: Self-pay | Admitting: Physician Assistant

## 2013-05-25 ENCOUNTER — Ambulatory Visit: Payer: BC Managed Care – PPO | Admitting: Physician Assistant

## 2013-05-25 DIAGNOSIS — R7303 Prediabetes: Secondary | ICD-10-CM

## 2013-05-25 DIAGNOSIS — E559 Vitamin D deficiency, unspecified: Secondary | ICD-10-CM | POA: Insufficient documentation

## 2013-05-25 MED ORDER — MELOXICAM 15 MG PO TABS: 15.0000 mg | ORAL_TABLET | Freq: Every day | ORAL | Status: DC

## 2013-05-25 MED ORDER — PHENTERMINE HCL 37.5 MG PO CAPS: 37.5000 mg | ORAL_CAPSULE | ORAL | Status: DC

## 2013-05-25 NOTE — Patient Instructions (Addendum)
Lower back pain- Mobic and back exercises if it is not better can refer to ortho.   go back to dove and add zyrtec (certizine) at night  Back Exercises Back exercises help treat and prevent back injuries. The goal of back exercises is to increase the strength of your abdominal and back muscles and the flexibility of your back. These exercises should be started when you no longer have back pain. Back exercises include:  Pelvic Tilt. Lie on your back with your knees bent. Tilt your pelvis until the lower part of your back is against the floor. Hold this position 5 to 10 sec and repeat 5 to 10 times.  Knee to Chest. Pull first 1 knee up against your chest and hold for 20 to 30 seconds, repeat this with the other knee, and then both knees. This may be done with the other leg straight or bent, whichever feels better.  Sit-Ups or Curl-Ups. Bend your knees 90 degrees. Start with tilting your pelvis, and do a partial, slow sit-up, lifting your trunk only 30 to 45 degrees off the floor. Take at least 2 to 3 seconds for each sit-up. Do not do sit-ups with your knees out straight. If partial sit-ups are difficult, simply do the above but with only tightening your abdominal muscles and holding it as directed.  Hip-Lift. Lie on your back with your knees flexed 90 degrees. Push down with your feet and shoulders as you raise your hips a couple inches off the floor; hold for 10 seconds, repeat 5 to 10 times.  Back arches. Lie on your stomach, propping yourself up on bent elbows. Slowly press on your hands, causing an arch in your low back. Repeat 3 to 5 times. Any initial stiffness and discomfort should lessen with repetition over time.  Shoulder-Lifts. Lie face down with arms beside your body. Keep hips and torso pressed to floor as you slowly lift your head and shoulders off the floor. Do not overdo your exercises, especially in the beginning. Exercises may cause you some mild back discomfort which lasts for a  few minutes; however, if the pain is more severe, or lasts for more than 15 minutes, do not continue exercises until you see your caregiver. Improvement with exercise therapy for back problems is slow.  See your caregivers for assistance with developing a proper back exercise program. Document Released: 08/05/2004 Document Revised: 09/20/2011 Document Reviewed: 04/29/2011 [facility] Patient Information 2014 [place], [place].

## 2013-05-25 NOTE — Progress Notes (Signed)
HPI Patient presents for a one month follow up, she was a new patient last visit. She has a BMI of 43.She is working on cutting back on fast food, fried foods, etc but feels she may need a medication for the holidays.   Lower back pain with DDD on xray patient is not on any medications currently, denies radiation, paraesthesia, bowel/bladder problems.  Area on arms and legs of large itchy red welps random. Patient did switch from dove to caraess gel and started vitamin D.   Allergies:  No Known Allergies  Current Medications:    Cholecalciferol 5000 UNITS capsule  Take 5,000 Units by mouth daily.     meloxicam 15 MG tablet  Commonly known as:  MOBIC  Take 15 mg by mouth daily.     phentermine 37.5 MG capsule  Take 37.5 mg by mouth every morning.        ROS: all negative expect above.   Physical: Estimated body mass index is 43.48 kg/(m^2) as calculated from the following:   Height as of this encounter: 5\' 1"  (1.549 m).   Weight as of this encounter: 230 lb (104.327 kg).   Filed Vitals:   05/25/13 0932  BP: 128/80  Pulse: 76  Temp: 98.1 F (36.7 C)  Resp: 16   General Appearance: Well nourished, in no apparent distress. Eyes: PERRLA, EOMs. Sinuses: No Frontal/maxillary tenderness ENT/Mouth: Ext aud canals clear, normal light reflex with TMs without erythema, bulging. Post pharynx without erythema, swelling, exudate.  Respiratory: CTAB Cardio: RRR, no murmurs, rubs or gallops. Peripheral pulses brisk and equal bilaterally, without edema. No aortic or femoral bruits. Abdomen: Obese, soft, with bowl sounds. Nontender, no guarding, rebound. Lymphatics: Non tender without lymphadenopathy.  Musculoskeletal: Full ROM all peripheral extremities, 5/5 strength, and normal gait. Negative straight leg.  Skin: Warm, dry with discreet erythematous macules on left arm, back, and legs.   Neuro: Cranial nerves intact, reflexes equal bilaterally. Normal muscle tone, no cerebellar  symptoms. Sensation intact.  Pysch: Awake and oriented X 3, normal affect, Insight and Judgment appropriate.   Assessment and Plan: Lower back pain- Mobic and back exercises if it is not better can refer to ortho.  ? Urticaria- go back to dove and add zyrtec (certizine) at night Obesity/preDm- phenteramine 37.5 # 30 with one refill. Long discussion diet and exercise.   Follow up in 2 months for lab and office follow up.

## 2013-08-14 ENCOUNTER — Encounter: Payer: Self-pay | Admitting: Physician Assistant

## 2013-08-14 ENCOUNTER — Ambulatory Visit (INDEPENDENT_AMBULATORY_CARE_PROVIDER_SITE_OTHER): Payer: BC Managed Care – PPO | Admitting: Physician Assistant

## 2013-08-14 VITALS — BP 110/72 | HR 100 | Temp 98.7°F | Resp 16 | Ht 61.5 in | Wt 226.0 lb

## 2013-08-14 DIAGNOSIS — I10 Essential (primary) hypertension: Secondary | ICD-10-CM

## 2013-08-14 DIAGNOSIS — E782 Mixed hyperlipidemia: Secondary | ICD-10-CM

## 2013-08-14 DIAGNOSIS — R7303 Prediabetes: Secondary | ICD-10-CM

## 2013-08-14 DIAGNOSIS — R7309 Other abnormal glucose: Secondary | ICD-10-CM

## 2013-08-14 DIAGNOSIS — M545 Low back pain, unspecified: Secondary | ICD-10-CM

## 2013-08-14 DIAGNOSIS — E785 Hyperlipidemia, unspecified: Secondary | ICD-10-CM

## 2013-08-14 LAB — CBC WITH DIFFERENTIAL/PLATELET
BASOS ABS: 0 10*3/uL (ref 0.0–0.1)
Basophils Relative: 0 % (ref 0–1)
Eosinophils Absolute: 0.1 10*3/uL (ref 0.0–0.7)
Eosinophils Relative: 1 % (ref 0–5)
HEMATOCRIT: 35.6 % — AB (ref 36.0–46.0)
Hemoglobin: 12.2 g/dL (ref 12.0–15.0)
Lymphocytes Relative: 24 % (ref 12–46)
Lymphs Abs: 2 10*3/uL (ref 0.7–4.0)
MCH: 26.9 pg (ref 26.0–34.0)
MCHC: 34.3 g/dL (ref 30.0–36.0)
MCV: 78.4 fL (ref 78.0–100.0)
MONOS PCT: 6 % (ref 3–12)
Monocytes Absolute: 0.5 10*3/uL (ref 0.1–1.0)
NEUTROS ABS: 5.6 10*3/uL (ref 1.7–7.7)
NEUTROS PCT: 69 % (ref 43–77)
Platelets: 325 10*3/uL (ref 150–400)
RBC: 4.54 MIL/uL (ref 3.87–5.11)
RDW: 14.4 % (ref 11.5–15.5)
WBC: 8.1 10*3/uL (ref 4.0–10.5)

## 2013-08-14 LAB — HEMOGLOBIN A1C
Hgb A1c MFr Bld: 5.8 % — ABNORMAL HIGH (ref <5.7)
Mean Plasma Glucose: 120 mg/dL — ABNORMAL HIGH (ref <117)

## 2013-08-14 MED ORDER — PHENTERMINE HCL 37.5 MG PO TABS: 37.5000 mg | ORAL_TABLET | Freq: Every day | ORAL | Status: DC

## 2013-08-14 NOTE — Progress Notes (Signed)
HPI Patient presents for 3 month follow up with hypertension, hyperlipidemia, prediabetes and vitamin D.  Patient's blood pressure has been controlled at home, today their BP is BP: 110/72 mmHg  Patient denies chest pain, shortness of breath, dizziness.   Patient's cholesterol is diet controlled.   She has been working on diet and exercise for preiabetes, and denies blurry vision, polydipsia, polyphagia and polyuria. Her A1C is not at goal.  Last A1C in the office was: 5.9  She has been on phentermine for weight loss. She is disappointed that she has not lost more weight, she states she is drinking more water and her portions are better. She has not been exercising as much as she wants. She has been trying to walk once a week. In the morning 2 eggs and fruit, lunch is small salad and tries to keep fruit for snacks, fruit snacks rather than chips, nuts, dinner is baked chicken, veggies, beans.  Wt Readings from Last 3 Encounters:  08/14/13 226 lb (102.513 kg)  05/25/13 230 lb (104.327 kg)   Patient is on Vitamin D supplement. Vitamin D 20. She is on Vitamin D.   Current Medications:  Current Outpatient Prescriptions on File Prior to Visit  Medication Sig Dispense Refill  . Cholecalciferol 5000 UNITS capsule Take 5,000 Units by mouth daily.      . meloxicam (MOBIC) 15 MG tablet Take 1 tablet (15 mg total) by mouth daily.  90 tablet  1  . phentermine 37.5 MG capsule Take 1 capsule (37.5 mg total) by mouth every morning.  30 capsule  1   No current facility-administered medications on file prior to visit.   Medical History:  Past Medical History  Diagnosis Date  . Abnormal Pap smear   . Cervical polyp   . Headache(784.0)   . Toxemia in pregnancy   . Depression   . Migraines   . Vitamin D deficiency   . Morbid obesity   . Prediabetes    Allergies: No Known Allergies  ROS Constitutional: Denies fever, chills, headaches, insomnia, fatigue, night sweats Eyes: Denies redness, blurred  vision, diplopia, discharge, itchy, watery eyes.  ENT: Denies congestion, post nasal drip, sore throat, earache, dental pain, Tinnitus, Vertigo, Sinus pain, snoring.  Cardio: Denies chest pain, palpitations, irregular heartbeat, dyspnea, diaphoresis, orthopnea, PND, claudication, edema Respiratory: denies cough, shortness of breath, wheezing.  Gastrointestinal: Denies dysphagia, heartburn, AB pain/ cramps, N/V, diarrhea, constipation, hematemesis, melena, hematochezia,  hemorrhoids Genitourinary: Denies dysuria, frequency, urgency, nocturia, hesitancy, discharge, hematuria, flank pain Musculoskeletal: Denies myalgia, stiffness, pain, swelling and strain/sprain. Skin: Denies pruritis, rash, changing in skin lesion Neuro: Denies Weakness, tremor, incoordination, spasms, pain Psychiatric: Denies confusion, memory loss, sensory loss Endocrine: Denies change in weight, skin, hair change, nocturia Diabetic Polys, Denies visual blurring, hyper /hypo glycemic episodes, and paresthesia, Heme/Lymph: Denies Excessive bleeding, bruising, enlarged lymph nodes  Family history- Review and unchanged Social history- Review and unchanged Physical Exam: Filed Vitals:   08/14/13 1111  BP: 110/72  Pulse: 100  Temp: 98.7 F (37.1 C)  Resp: 16   Filed Weights   08/14/13 1111  Weight: 226 lb (102.513 kg)   General Appearance: Well nourished, in no apparent distress. Eyes: PERRLA, EOMs, conjunctiva no swelling or erythema Sinuses: No Frontal/maxillary tenderness ENT/Mouth: Ext aud canals clear, TMs without erythema, bulging. No erythema, swelling, or exudate on post pharynx.  Tonsils not swollen or erythematous. Hearing normal.  Neck: Supple, thyroid normal.  Respiratory: Respiratory effort normal, BS equal bilaterally without rales, rhonchi,  wheezing or stridor.  Cardio: RRR with no MRGs. Brisk peripheral pulses without edema.  Abdomen: Soft, + BS.  Non tender, no guarding, rebound, hernias,  masses. Lymphatics: Non tender without lymphadenopathy.  Musculoskeletal: Full ROM, 5/5 strength, normal gait.  Skin: Warm, dry without rashes, lesions, ecchymosis.  Neuro: Cranial nerves intact. Normal muscle tone, no cerebellar symptoms. Sensation intact.  Psych: Awake and oriented X 3, normal affect, Insight and Judgment appropriate.   Assessment and Plan:  Hypertension: Continue medication, monitor blood pressure at home. Continue DASH diet. Cholesterol: Continue diet and exercise. Check cholesterol.  Pre-diabetes-Continue diet and exercise. Check A1C Obesity- BMI 40+- refill phentermine and follow up in one month, long discussion diet and exercise Vitamin D Def- check level and continue medications.  LBP- better on meloxicam, takes with food, no GERD  Continue diet and meds as discussed. Further disposition pending results of labs.  Vicie Mutters 11:19 AM

## 2013-08-14 NOTE — Patient Instructions (Signed)
Bad carbs also include fruit juice, alcohol, and sweet tea. These are empty calories that do not signal to your brain that you are full.   Please remember the good carbs are still carbs which convert into sugar. So please measure them out no more than 1/2-1 cup of rice, oatmeal, pasta, and beans.  Veggies are however free foods! Pile them on.   I like lean protein at every meal such as chicken, [place], pork chops, cottage cheese, etc. Just do not fry these meats and please center your meal around vegetable, the meats should be a side dish.   No all fruit is created equal. Please see the list below, the fruit at the bottom is higher in sugars than the fruit at the top   Remember exercise is great for your cardiovascular health and can help with weight loss but YOU CAN NOT OUT RUN YOUR FORK!    Phentermine  While taking the medication we will ask that you come into the office once a month to monitor your weight, blood pressure, and heart rate. In addition we can help answer your questions about diet, exercise, and help you every step of the way with your weight loss journey. Sometime it is helpful if you bring in a food diary or use an app on your phone such as myfitnesspal to record your calorie intake, especially in the beginning.   What is this medicine? PHENTERMINE (FEN ter meen) decreases your appetite. This medicine is intended to be used in addition to a healthy reduced calorie diet and exercise. The best results are achieved this way. This medicine is only indicated for short-term use. Eventually your weight loss may level out and the medication will no longer be needed.   How should I use this medicine? Take this medicine by mouth. Follow the directions on the prescription label. The tablets should stay in the bottle until immediately before you take your dose. Take your doses at regular intervals. Do not take your medicine more often than directed.  Overdosage: If you think you have  taken too much of this medicine contact a poison control center or emergency room at once. NOTE: This medicine is only for you. Do not share this medicine with others.  What if I miss a dose? If you miss a dose, take it as soon as you can. If it is almost time for your next dose, take only that dose. Do not take double or extra doses. Do not increase or in any way change your dose without consulting your doctor.  What should I watch for while using this medicine? Notify your physician immediately if you become short of breath while doing your normal activities. Do not take this medicine within 6 hours of bedtime. It can keep you from getting to sleep. Avoid drinks that contain caffeine and try to stick to a regular bedtime every night. Do not stand or sit up quickly, especially if you are an older patient. This reduces the risk of dizzy or fainting spells. Avoid alcoholic drinks.  What side effects may I notice from receiving this medicine? Side effects that you should report to your doctor or health care professional as soon as possible: -chest pain, palpitations -depression or severe changes in mood -increased blood pressure -irritability -nervousness or restlessness -severe dizziness -shortness of breath -problems urinating -unusual swelling of the legs -vomiting  Side effects that usually do not require medical attention (report to your doctor or health care professional if they  continue or are bothersome): -blurred vision or other eye problems -changes in sexual ability or desire -constipation or diarrhea -difficulty sleeping -dry mouth or unpleasant taste -headache -nausea This list may not describe all possible side effects. Call your doctor for medical advice about side effects. You may report side effects to FDA at 1-800-FDA-1088.  We are starting you on Metformin to prevent or treat diabetes. Metformin does not cause low blood sugars. In order to create energy your cells  need insulin and sugar but sometime your cells do not accept the insulin and this can cause increased sugars and decreased energy. The Metformin helps your cells accept insulin and the sugar to give you more energy.   The two most common side effects are nausea and diarrhea, follow these rules to avoid it! You can take imodium per box instructions when starting metformin if needed.   Rules of metformin: 1) start out slow with only one pill daily. Our goal for you is 4 pills a day or 2000mg  total.  2) take with your largest meal. 3) Take with least amount of carbs.   Call if you have any problems.

## 2013-08-15 LAB — HEPATIC FUNCTION PANEL
ALT: 15 U/L (ref 0–35)
AST: 16 U/L (ref 0–37)
Albumin: 4 g/dL (ref 3.5–5.2)
Alkaline Phosphatase: 64 U/L (ref 39–117)
BILIRUBIN DIRECT: 0.1 mg/dL (ref 0.0–0.3)
BILIRUBIN INDIRECT: 0.2 mg/dL (ref 0.2–1.2)
BILIRUBIN TOTAL: 0.3 mg/dL (ref 0.2–1.2)
Total Protein: 6.8 g/dL (ref 6.0–8.3)

## 2013-08-15 LAB — INSULIN, FASTING: Insulin fasting, serum: 20 u[IU]/mL (ref 3–28)

## 2013-08-15 LAB — BASIC METABOLIC PANEL WITH GFR
BUN: 7 mg/dL (ref 6–23)
CHLORIDE: 106 meq/L (ref 96–112)
CO2: 26 mEq/L (ref 19–32)
Calcium: 9.1 mg/dL (ref 8.4–10.5)
Creat: 0.73 mg/dL (ref 0.50–1.10)
Glucose, Bld: 91 mg/dL (ref 70–99)
POTASSIUM: 4.3 meq/L (ref 3.5–5.3)
SODIUM: 138 meq/L (ref 135–145)

## 2013-08-15 LAB — LIPID PANEL
CHOL/HDL RATIO: 3.9 ratio
Cholesterol: 159 mg/dL (ref 0–200)
HDL: 41 mg/dL (ref >39)
LDL CALC: 83 mg/dL (ref 0–99)
TRIGLYCERIDES: 173 mg/dL — AB (ref <150)
VLDL: 35 mg/dL (ref 0–40)

## 2013-08-15 LAB — TSH: TSH: 1.154 u[IU]/mL (ref 0.350–4.500)

## 2013-09-11 ENCOUNTER — Encounter: Payer: Self-pay | Admitting: Physician Assistant

## 2013-09-11 ENCOUNTER — Ambulatory Visit (INDEPENDENT_AMBULATORY_CARE_PROVIDER_SITE_OTHER): Payer: BC Managed Care – PPO | Admitting: Physician Assistant

## 2013-09-11 DIAGNOSIS — G43909 Migraine, unspecified, not intractable, without status migrainosus: Secondary | ICD-10-CM

## 2013-09-11 MED ORDER — SUMATRIPTAN SUCCINATE 100 MG PO TABS
100.0000 mg | ORAL_TABLET | Freq: Once | ORAL | Status: DC | PRN
Start: 2013-09-11 — End: 2013-12-14

## 2013-09-11 MED ORDER — PHENTERMINE HCL 37.5 MG PO TABS: 37.5000 mg | ORAL_TABLET | Freq: Every day | ORAL | Status: DC

## 2013-09-11 NOTE — Patient Instructions (Signed)
Phentermine  While taking the medication we will ask that you come into the office once a month to monitor your weight, blood pressure, and heart rate. In addition we can help answer your questions about diet, exercise, and help you every step of the way with your weight loss journey. Sometime it is helpful if you bring in a food diary or use an app on your phone such as myfitnesspal to record your calorie intake, especially in the beginning.   You can start out on 1/3 to 1/2 a pill in the morning and if you are tolerating it well you can increase to one pill daily.   What is this medicine? PHENTERMINE (FEN ter meen) decreases your appetite. This medicine is intended to be used in addition to a healthy reduced calorie diet and exercise. The best results are achieved this way. This medicine is only indicated for short-term use. Eventually your weight loss may level out and the medication will no longer be needed.   How should I use this medicine? Take this medicine by mouth. Follow the directions on the prescription label. The tablets should stay in the bottle until immediately before you take your dose. Take your doses at regular intervals. Do not take your medicine more often than directed.  Overdosage: If you think you have taken too much of this medicine contact a poison control center or emergency room at once. NOTE: This medicine is only for you. Do not share this medicine with others.  What if I miss a dose? If you miss a dose, take it as soon as you can. If it is almost time for your next dose, take only that dose. Do not take double or extra doses. Do not increase or in any way change your dose without consulting your doctor.  What should I watch for while using this medicine? Notify your physician immediately if you become short of breath while doing your normal activities. Do not take this medicine within 6 hours of bedtime. It can keep you from getting to sleep. Avoid drinks that contain  caffeine and try to stick to a regular bedtime every night. Do not stand or sit up quickly, especially if you are an older patient. This reduces the risk of dizzy or fainting spells. Avoid alcoholic drinks.  What side effects may I notice from receiving this medicine? Side effects that you should report to your doctor or health care professional as soon as possible: -chest pain, palpitations -depression or severe changes in mood -increased blood pressure -irritability -nervousness or restlessness -severe dizziness -shortness of breath -problems urinating -unusual swelling of the legs -vomiting  Side effects that usually do not require medical attention (report to your doctor or health care professional if they continue or are bothersome): -blurred vision or other eye problems -changes in sexual ability or desire -constipation or diarrhea -difficulty sleeping -dry mouth or unpleasant taste -headache -nausea This list may not describe all possible side effects. Call your doctor for medical advice about side effects. You may report side effects to FDA at 1-800-FDA-1088.  We want weight loss that will last so you should lose 1-2 pounds a week.  THAT IS IT! Please pick THREE things a month to change. Once it is a habit check off the item. Then pick another three items off the list to become habits.  If you are already doing a habit on the list GREAT!  Cross that item off! o Don't drink your calories. Ie, alcohol, soda, fruit  juice, and sweet tea.  o Drink more water. Drink a glass when you feel hungry or before each meal.  o Eat breakfast - Complex carb and protein (likeDannon light and fit yogurt, oatmeal, fruit, eggs, [place] bacon). o Measure your cereal.  Eat no more than one cup a day. (ie [place]) o Eat an apple a day. o Add a vegetable a day. o Try a new vegetable a month. o Use Pam! Stop using oil or butter to cook. o Don't finish your plate or use smaller plates. o Share your  dessert. o Eat sugar free Jello for dessert or frozen grapes. o Don't eat 2-3 hours before bed. o Switch to whole wheat bread, pasta, and brown rice. o Make healthier choices when you eat out. No fries! o Pick baked chicken, NOT fried. o Don't forget to SLOW DOWN when you eat. It is not going anywhere.  o Take the stairs. o Park far away in the parking lot o [employer] (or weights) for 10 minutes while watching TV. o Walk at work for 10 minutes during break. o Walk outside 1 time a week with your friend, kids, dog, or significant other. o Start a walking group at [place] the mall as much as you can tolerate.  o Keep a food diary. o Weigh yourself daily. o Walk for 15 minutes 3 days per week. o Cook at home more often and eat out less.  If life happens and you go back to old habits, it is okay.  Just start over. You can do it!   If you experience chest pain, get short of breath, or tired during the exercise, please stop immediately and inform your doctor.    Bad carbs also include fruit juice, alcohol, and sweet tea. These are empty calories that do not signal to your brain that you are full.   Please remember the good carbs are still carbs which convert into sugar. So please measure them out no more than 1/2-1 cup of rice, oatmeal, pasta, and beans.  Veggies are however free foods! Pile them on.   I like lean protein at every meal such as chicken, [place], pork chops, cottage cheese, etc. Just do not fry these meats and please center your meal around vegetable, the meats should be a side dish.   No all fruit is created equal. Please see the list below, the fruit at the bottom is higher in sugars than the fruit at the top

## 2013-09-11 NOTE — Progress Notes (Signed)
38 y.o.female presents for a follow up after being on phentermine for weight loss for 2 months. Patient states they have better variety, decreased fat intake and more consistent meal timing. While on the phentermine they have lost 9 lbs since last visit. They deny palpitations, anxiety, trouble sleeping, elevated BP.  Headaches since Saturday, taking ibuprofen, left sided pain. + Photo and phonophobia. Lying in dark room.   Medications: Current Outpatient Prescriptions on File Prior to Visit  Medication Sig Dispense Refill  . Cholecalciferol 5000 UNITS capsule Take 5,000 Units by mouth daily.      . meloxicam (MOBIC) 15 MG tablet Take 1 tablet (15 mg total) by mouth daily.  90 tablet  1  . phentermine (ADIPEX-P) 37.5 MG tablet Take 1 tablet (37.5 mg total) by mouth daily before breakfast.  30 tablet  0   No current facility-administered medications on file prior to visit.    ROS: All negative except for above  Physical exam: Wt Readings from Last 3 Encounters:  09/11/13 221 lb (100.245 kg)  08/14/13 226 lb (102.513 kg)  05/25/13 230 lb (104.327 kg)   Filed Vitals:   09/11/13 1145  BP: 120/72  Pulse: 84  Temp: 97.5 F (36.4 C)  Resp: 16   BP 120/72  Pulse 84  Temp(Src) 97.5 F (36.4 C)  Resp 16  Wt 221 lb (100.245 kg) BP 120/72  Pulse 84  Temp(Src) 97.5 F (36.4 C)  Resp 16  Wt 221 lb (100.245 kg) General appearance: alert, cooperative and moderately obese Head: Normocephalic, without obvious abnormality, atraumatic Eyes: conjunctivae/corneas clear. PERRL, EOM's intact. Fundi benign. Ears: normal TM's and external ear canals both ears Nose: Nares normal. Septum midline. Mucosa normal. No drainage or sinus tenderness. Throat: lips, mucosa, and tongue normal; teeth and gums normal Lungs: clear to auscultation bilaterally and normal percussion bilaterally Heart: regular rate and rhythm, S1, S2 normal, no murmur, click, rub or gallop Abdomen: soft, non-tender; bowel sounds  normal; no masses,  no organomegaly Neurologic: Grossly normal  Assessment: Obesity with co morbid conditions.  Migraines Plan: General weight loss/lifestyle modification strategies discussed (elicit support from others; identify saboteurs; non-food rewards, etc). Behavioral treatment: stress management. Diet interventions: diet diary for the following month and qualitative changes (increase low-fat,  high-fiber foods). Informal exercise measures discussed, e.g. taking stairs instead of elevator. Regular aerobic exercise program discussed. Medication: phentermine. Follow up in: 1 month and as needed.  Imitrex as needed

## 2013-10-09 ENCOUNTER — Ambulatory Visit: Payer: Self-pay | Admitting: Physician Assistant

## 2013-10-16 ENCOUNTER — Ambulatory Visit (INDEPENDENT_AMBULATORY_CARE_PROVIDER_SITE_OTHER): Payer: BC Managed Care – PPO | Admitting: Physician Assistant

## 2013-10-16 ENCOUNTER — Encounter: Payer: Self-pay | Admitting: Physician Assistant

## 2013-10-16 MED ORDER — PHENTERMINE HCL 37.5 MG PO TABS: 37.5000 mg | ORAL_TABLET | Freq: Every day | ORAL | Status: DC

## 2013-10-16 NOTE — Progress Notes (Signed)
38 y.o.female presents for a follow up after being on phentermine for weight loss for 3 months. Patient states they have not been working on diet and exercise much due to getting married May 2nd. She has decreased portions, and still trying to decrease carbs, eating out less. While on the phentermine they have lost 9 lbs since last visit. They deny palpitations, anxiety, trouble sleeping, elevated BP.   Wt Readings from Last 3 Encounters:  10/16/13 220 lb (99.791 kg)  09/11/13 221 lb (100.245 kg)  08/14/13 226 lb (102.513 kg)    Medications: Current Outpatient Prescriptions on File Prior to Visit  Medication Sig Dispense Refill  . Cholecalciferol 5000 UNITS capsule Take 5,000 Units by mouth daily.      . meloxicam (MOBIC) 15 MG tablet Take 1 tablet (15 mg total) by mouth daily.  90 tablet  1  . phentermine (ADIPEX-P) 37.5 MG tablet Take 1 tablet (37.5 mg total) by mouth daily before breakfast.  30 tablet  0  . SUMAtriptan (IMITREX) 100 MG tablet Take 1 tablet (100 mg total) by mouth once as needed for migraine. May repeat in 2 hours if headache persists or recurs.  6 tablet  2   No current facility-administered medications on file prior to visit.    ROS: All negative except for above  Physical exam: Filed Vitals:   10/16/13 1358  BP: 110/78  Pulse: 80  Temp: 97.9 F (36.6 C)  Resp: 16   BP 110/78  Pulse 80  Temp(Src) 97.9 F (36.6 C)  Resp 16  Ht 5' 1.5" (1.562 m)  Wt 220 lb (99.791 kg)  BMI 40.90 kg/m2 General appearance: alert Neck: no adenopathy, no carotid bruit, no JVD, supple, symmetrical, trachea midline and thyroid not enlarged, symmetric, no tenderness/mass/nodules Lungs: clear to auscultation bilaterally Heart: regular rate and rhythm, S1, S2 normal, no murmur, click, rub or gallop Abdomen: soft, non-tender; bowel sounds normal; no masses,  no organomegaly and obese Extremities: extremities normal, atraumatic, no cyanosis or edema Pulses: 2+ and  symmetric Neurologic: Grossly normal  Assessment: Obesity with co morbid conditions.   Plan: General weight loss/lifestyle modification strategies discussed (elicit support from others; identify saboteurs; non-food rewards, etc). Diet interventions: diet diary indefinitely. Informal exercise measures discussed, e.g. taking stairs instead of elevator. Regular aerobic exercise program discussed. Medication: phentermine. Follow up in: 2-3 months and as needed.

## 2013-10-16 NOTE — Patient Instructions (Signed)
Phentermine  While taking the medication we will ask that you come into the office once a month to monitor your weight, blood pressure, and heart rate. In addition we can help answer your questions about diet, exercise, and help you every step of the way with your weight loss journey. Sometime it is helpful if you bring in a food diary or use an app on your phone such as myfitnesspal to record your calorie intake, especially in the beginning.   You can start out on 1/3 to 1/2 a pill in the morning and if you are tolerating it well you can increase to one pill daily.   What is this medicine? PHENTERMINE (FEN ter meen) decreases your appetite. This medicine is intended to be used in addition to a healthy reduced calorie diet and exercise. The best results are achieved this way. This medicine is only indicated for short-term use. Eventually your weight loss may level out and the medication will no longer be needed.   How should I use this medicine? Take this medicine by mouth. Follow the directions on the prescription label. The tablets should stay in the bottle until immediately before you take your dose. Take your doses at regular intervals. Do not take your medicine more often than directed.  Overdosage: If you think you have taken too much of this medicine contact a poison control center or emergency room at once. NOTE: This medicine is only for you. Do not share this medicine with others.  What if I miss a dose? If you miss a dose, take it as soon as you can. If it is almost time for your next dose, take only that dose. Do not take double or extra doses. Do not increase or in any way change your dose without consulting your doctor.  What should I watch for while using this medicine? Notify your physician immediately if you become short of breath while doing your normal activities. Do not take this medicine within 6 hours of bedtime. It can keep you from getting to sleep. Avoid drinks that contain  caffeine and try to stick to a regular bedtime every night. Do not stand or sit up quickly, especially if you are an older patient. This reduces the risk of dizzy or fainting spells. Avoid alcoholic drinks.  What side effects may I notice from receiving this medicine? Side effects that you should report to your doctor or health care professional as soon as possible: -chest pain, palpitations -depression or severe changes in mood -increased blood pressure -irritability -nervousness or restlessness -severe dizziness -shortness of breath -problems urinating -unusual swelling of the legs -vomiting  Side effects that usually do not require medical attention (report to your doctor or health care professional if they continue or are bothersome): -blurred vision or other eye problems -changes in sexual ability or desire -constipation or diarrhea -difficulty sleeping -dry mouth or unpleasant taste -headache -nausea This list may not describe all possible side effects. Call your doctor for medical advice about side effects. You may report side effects to FDA at 1-800-FDA-1088.

## 2013-11-04 ENCOUNTER — Encounter (HOSPITAL_COMMUNITY): Payer: Self-pay | Admitting: Emergency Medicine

## 2013-11-04 ENCOUNTER — Emergency Department (HOSPITAL_COMMUNITY)
Admission: EM | Admit: 2013-11-04 | Discharge: 2013-11-04 | Disposition: A | Discharge status: Home / Self Care | Payer: BC Managed Care – PPO | Attending: Emergency Medicine | Admitting: Emergency Medicine

## 2013-11-04 ENCOUNTER — Emergency Department (HOSPITAL_COMMUNITY): Payer: BC Managed Care – PPO

## 2013-11-04 DIAGNOSIS — R1011 Right upper quadrant pain: Secondary | ICD-10-CM | POA: Insufficient documentation

## 2013-11-04 DIAGNOSIS — R1013 Epigastric pain: Secondary | ICD-10-CM | POA: Insufficient documentation

## 2013-11-04 DIAGNOSIS — G43909 Migraine, unspecified, not intractable, without status migrainosus: Secondary | ICD-10-CM | POA: Insufficient documentation

## 2013-11-04 DIAGNOSIS — Z8659 Personal history of other mental and behavioral disorders: Secondary | ICD-10-CM | POA: Insufficient documentation

## 2013-11-04 DIAGNOSIS — Z8742 Personal history of other diseases of the female genital tract: Secondary | ICD-10-CM | POA: Insufficient documentation

## 2013-11-04 DIAGNOSIS — R109 Unspecified abdominal pain: Secondary | ICD-10-CM

## 2013-11-04 DIAGNOSIS — Z79899 Other long term (current) drug therapy: Secondary | ICD-10-CM | POA: Insufficient documentation

## 2013-11-04 LAB — CBC WITH DIFFERENTIAL/PLATELET
BASOS ABS: 0 10*3/uL (ref 0.0–0.1)
BASOS PCT: 0 % (ref 0–1)
EOS PCT: 0 % (ref 0–5)
Eosinophils Absolute: 0 10*3/uL (ref 0.0–0.7)
HEMATOCRIT: 37.5 % (ref 36.0–46.0)
HEMOGLOBIN: 12.6 g/dL (ref 12.0–15.0)
Lymphocytes Relative: 22 % (ref 12–46)
Lymphs Abs: 2.3 10*3/uL (ref 0.7–4.0)
MCH: 26.9 pg (ref 26.0–34.0)
MCHC: 33.6 g/dL (ref 30.0–36.0)
MCV: 80.1 fL (ref 78.0–100.0)
MONOS PCT: 7 % (ref 3–12)
Monocytes Absolute: 0.7 10*3/uL (ref 0.1–1.0)
Neutro Abs: 7.1 10*3/uL (ref 1.7–7.7)
Neutrophils Relative %: 70 % (ref 43–77)
Platelets: 288 10*3/uL (ref 150–400)
RBC: 4.68 MIL/uL (ref 3.87–5.11)
RDW: 12.9 % (ref 11.5–15.5)
WBC: 10.1 10*3/uL (ref 4.0–10.5)

## 2013-11-04 LAB — URINALYSIS, ROUTINE W REFLEX MICROSCOPIC
Bilirubin Urine: NEGATIVE
GLUCOSE, UA: NEGATIVE mg/dL
Hgb urine dipstick: NEGATIVE
Ketones, ur: NEGATIVE mg/dL
LEUKOCYTES UA: NEGATIVE
Nitrite: NEGATIVE
PROTEIN: NEGATIVE mg/dL
Specific Gravity, Urine: 1.013 (ref 1.005–1.030)
UROBILINOGEN UA: 0.2 mg/dL (ref 0.0–1.0)
pH: 5 (ref 5.0–8.0)

## 2013-11-04 LAB — COMPREHENSIVE METABOLIC PANEL
ALBUMIN: 3.7 g/dL (ref 3.5–5.2)
ALT: 16 U/L (ref 0–35)
AST: 16 U/L (ref 0–37)
Alkaline Phosphatase: 76 U/L (ref 39–117)
BILIRUBIN TOTAL: 0.2 mg/dL — AB (ref 0.3–1.2)
BUN: 12 mg/dL (ref 6–23)
CHLORIDE: 100 meq/L (ref 96–112)
CO2: 25 mEq/L (ref 19–32)
CREATININE: 0.78 mg/dL (ref 0.50–1.10)
Calcium: 9.7 mg/dL (ref 8.4–10.5)
GFR calc Af Amer: >90 mL/min (ref >90)
Glucose, Bld: 96 mg/dL (ref 70–99)
Potassium: 4.3 mEq/L (ref 3.7–5.3)
Sodium: 136 mEq/L — ABNORMAL LOW (ref 137–147)
Total Protein: 7.9 g/dL (ref 6.0–8.3)

## 2013-11-04 LAB — LIPASE, BLOOD: Lipase: 52 U/L (ref 11–59)

## 2013-11-04 MED ORDER — MORPHINE SULFATE 4 MG/ML IJ SOLN
4.0000 mg | Freq: Once | INTRAMUSCULAR | Status: AC
  Administered 2013-11-04: 4 mg via INTRAVENOUS
  Filled 2013-11-04: qty 1

## 2013-11-04 MED ORDER — OXYCODONE-ACETAMINOPHEN 5-325 MG PO TABS: 1.0000 | ORAL_TABLET | Freq: Four times a day (QID) | ORAL | Status: DC | PRN

## 2013-11-04 MED ORDER — SODIUM CHLORIDE 0.9 % IV BOLUS (SEPSIS)
1000.0000 mL | Freq: Once | INTRAVENOUS | Status: AC
  Administered 2013-11-04: 1000 mL via INTRAVENOUS

## 2013-11-04 MED ORDER — ONDANSETRON HCL 4 MG PO TABS: 4.0000 mg | ORAL_TABLET | Freq: Four times a day (QID) | ORAL | Status: DC

## 2013-11-04 NOTE — ED Provider Notes (Signed)
Medical screening examination/treatment/procedure(s) were performed by non-physician practitioner and as supervising physician I was immediately available for consultation/collaboration.   EKG Interpretation None        Hoy Morn, MD 11/04/13 1558

## 2013-11-04 NOTE — ED Provider Notes (Signed)
CSN: [id number]     Arrival date & time 11/04/13  1212 History   First MD Initiated Contact with Patient 11/04/13 1251     Chief Complaint  Patient presents with  . Abdominal Pain     (Consider location/radiation/quality/duration/timing/severity/associated sxs/prior Treatment) HPI Comments: Patient is a 38 year old female past medical history significant for migraines, depression, obesity presenting to the emergency department for evaluation of abdominal pain. Patient states last evening she developed periumbilical pain with radiation into the epigastrium and right upper quadrant. She states it is severe cramping in nature that waxes and wanes. She states today she is having more epigastric and right upper quadrant abdominal pain but no umbilical pain. She states her pain is improved with lying flat and worsened with movement. She attempted to take a Gas-X thinking it was gas pains without improvement. Patient does endorse using greasy fatty foods last evening at a bridal shower. She denies any fevers, chills, nausea, vomiting, diarrhea, constipation, vaginal bleeding or discharge, urinary symptoms. Abdominal surgical history includes endometrial ablation and tubal ligation and cesarean section.  Patient is a 38 y.o. female presenting with abdominal pain.  Abdominal Pain   Past Medical History  Diagnosis Date  . Abnormal Pap smear   . Cervical polyp   . Headache(784.0)   . Toxemia in pregnancy   . Depression   . Migraines   . Vitamin D deficiency   . Morbid obesity   . Prediabetes    Past Surgical History  Procedure Laterality Date  . Cryotherapy    . Endometrial ablation    . Tubal ligation    . Wisdom tooth extraction    . Cesarean section     Family History  Problem Relation Age of Onset  . Anesthesia problems Neg Hx   . Hypotension Neg Hx   . Malignant hyperthermia Neg Hx   . Pseudochol deficiency Neg Hx   . Hypertension Mother   . Arthritis Mother   . Hypertension  Brother   . Asthma Daughter    History  Substance Use Topics  . Smoking status: Never Smoker   . Smokeless tobacco: Never Used  . Alcohol Use: Yes     Comment: occasionally   OB History   Grav Para Term Preterm Abortions TAB SAB Ect Mult Living   2 2 1 1      2      Review of Systems  Gastrointestinal: Positive for abdominal pain.  All other systems reviewed and are negative.     Allergies  Review of patient's allergies indicates no known allergies.  Home Medications   Prior to Admission medications   Medication Sig Start Date End Date Taking? Authorizing Provider  CYANOCOBALAMIN PO Take 1 tablet by mouth daily.   Yes Historical Provider, MD  phentermine (ADIPEX-P) 37.5 MG tablet Take 1 tablet (37.5 mg total) by mouth daily before breakfast. 10/16/13  Yes Vicie Mutters, PA-C  SUMAtriptan (IMITREX) 100 MG tablet Take 1 tablet (100 mg total) by mouth once as needed for migraine. May repeat in 2 hours if headache persists or recurs. 09/11/13 09/11/14 Yes Vicie Mutters, PA-C   BP 118/67  Pulse 82  Temp(Src) 98.4 F (36.9 C) (Oral)  Resp 18  SpO2 100% Physical Exam  Nursing note and vitals reviewed. Constitutional: She is oriented to person, place, and time. She appears well-developed and well-nourished. No distress.  HENT:  Head: Normocephalic and atraumatic.  Right Ear: External ear normal.  Left Ear: External ear normal.  Nose: Nose  normal.  Eyes: Conjunctivae are normal.  Neck: Neck supple.  Cardiovascular: Normal rate, regular rhythm and normal heart sounds.   Pulmonary/Chest: Effort normal and breath sounds normal. No respiratory distress.  Abdominal: Soft. Bowel sounds are normal. She exhibits no distension. There is tenderness in the right upper quadrant and epigastric area. There is positive Murphy's sign. There is no rigidity, no rebound, no guarding, no CVA tenderness and no tenderness at McBurney's point.  Musculoskeletal: Normal range of motion.  Neurological:  She is alert and oriented to person, place, and time.  Skin: Skin is warm and dry. She is not diaphoretic.    ED Course  Procedures (including critical care time) Medications  morphine 4 MG/ML injection 4 mg (not administered)  sodium chloride 0.9 % bolus 1,000 mL (1,000 mLs Intravenous New Bag/Given 11/04/13 1356)  morphine 4 MG/ML injection 4 mg (4 mg Intravenous Given 11/04/13 1356)    Labs Review Labs Reviewed  COMPREHENSIVE METABOLIC PANEL - Abnormal; Notable for the following:    Sodium 136 (*)    Total Bilirubin 0.2 (*)    All other components within normal limits  CBC WITH DIFFERENTIAL  URINALYSIS, ROUTINE W REFLEX MICROSCOPIC  LIPASE, BLOOD    Imaging Review US Abdomen Complete  11/04/2013   CLINICAL DATA:  Right upper quadrant and epigastric pain.  EXAM: ULTRASOUND ABDOMEN COMPLETE  COMPARISON:  07/15/2006  FINDINGS: Gallbladder:  No gallstones or wall thickening visualized. No sonographic Murphy sign noted.  Common bile duct:  Diameter: 3.7 mm proximally.  Distal duct not seen.  Liver:  Diffusely increased echogenicity.  No focal lesion identified.  IVC:  No abnormality visualized.  Pancreas:  Obscured by bowel gas.  Spleen:  Size and appearance within normal limits.  Right Kidney:  Length: 11.6 cm. Echogenicity within normal limits. No mass or hydronephrosis visualized.  Left Kidney:  Length: 9.8 cm. Echogenicity within normal limits. No mass or hydronephrosis visualized.  Abdominal aorta:  Non dilated proximally.  Mid and distal portions obscured.  Other findings:  None.  IMPRESSION: 1. Unremarkable appearance of the gallbladder. No biliary dilatation. 2. Diffusely increased liver echogenicity, compatible with steatosis.   Electronically Signed   By: Logan Bores   On: 11/04/2013 15:38     EKG Interpretation None      MDM   Final diagnoses:  Abdominal pain    Filed Vitals:   11/04/13 1515  BP: 118/67  Pulse: 82  Temp:   Resp: 18   Afebrile, NAD, non-toxic  appearing, AAOx4.  Abdomen soft, tender in right upper quadrant and epigastric region, nondistended normal bowel sounds. No peritoneal signs. No CVA tenderness. Labs reviewed without acute abnormality. Will treat with IV fluids and pain medication. Will obtain abdominal ultrasound to look at gallbladder for possible etiologies of pain. Will sign out to Delos Haring, PA-C pending [place] results. Patient will likely be able to go home pending any acute abnormalities noted on ultrasound with pain management and followup and good return precautions.    Harlow Mares, PA-C 11/04/13 1550

## 2013-11-04 NOTE — ED Provider Notes (Signed)
Patient sign out to me from [facility], PA-C  Pt here for RUQ pain to be receiving an abdominal US for further evaluation. The patients labs are unremarkable and her pain is easily controlled.   Results for orders placed during the hospital encounter of 11/04/13  CBC WITH DIFFERENTIAL      Result Value Ref Range   WBC 10.1  4.0 - 10.5 K/uL   RBC 4.68  3.87 - 5.11 MIL/uL   Hemoglobin 12.6  12.0 - 15.0 g/dL   HCT 37.5  36.0 - 46.0 %   MCV 80.1  78.0 - 100.0 fL   MCH 26.9  26.0 - 34.0 pg   MCHC 33.6  30.0 - 36.0 g/dL   RDW 12.9  11.5 - 15.5 %   Platelets 288  150 - 400 K/uL   Neutrophils Relative % 70  43 - 77 %   Neutro Abs 7.1  1.7 - 7.7 K/uL   Lymphocytes Relative 22  12 - 46 %   Lymphs Abs 2.3  0.7 - 4.0 K/uL   Monocytes Relative 7  3 - 12 %   Monocytes Absolute 0.7  0.1 - 1.0 K/uL   Eosinophils Relative 0  0 - 5 %   Eosinophils Absolute 0.0  0.0 - 0.7 K/uL   Basophils Relative 0  0 - 1 %   Basophils Absolute 0.0  0.0 - 0.1 K/uL  COMPREHENSIVE METABOLIC PANEL      Result Value Ref Range   Sodium 136 (*) 137 - 147 mEq/L   Potassium 4.3  3.7 - 5.3 mEq/L   Chloride 100  96 - 112 mEq/L   CO2 25  19 - 32 mEq/L   Glucose, Bld 96  70 - 99 mg/dL   BUN 12  6 - 23 mg/dL   Creatinine, Ser 0.78  0.50 - 1.10 mg/dL   Calcium 9.7  8.4 - 10.5 mg/dL   Total Protein 7.9  6.0 - 8.3 g/dL   Albumin 3.7  3.5 - 5.2 g/dL   AST 16  0 - 37 U/L   ALT 16  0 - 35 U/L   Alkaline Phosphatase 76  39 - 117 U/L   Total Bilirubin 0.2 (*) 0.3 - 1.2 mg/dL   GFR calc non Af Amer >90  >90 mL/min   GFR calc Af Amer >90  >90 mL/min  URINALYSIS, ROUTINE W REFLEX MICROSCOPIC      Result Value Ref Range   Color, Urine YELLOW  YELLOW   APPearance CLEAR  CLEAR   Specific Gravity, Urine 1.013  1.005 - 1.030   pH 5.0  5.0 - 8.0   Glucose, UA NEGATIVE  NEGATIVE mg/dL   Hgb urine dipstick NEGATIVE  NEGATIVE   Bilirubin Urine NEGATIVE  NEGATIVE   Ketones, ur NEGATIVE  NEGATIVE mg/dL   Protein, ur NEGATIVE   NEGATIVE mg/dL   Urobilinogen, UA 0.2  0.0 - 1.0 mg/dL   Nitrite NEGATIVE  NEGATIVE   Leukocytes, UA NEGATIVE  NEGATIVE  LIPASE, BLOOD      Result Value Ref Range   Lipase 52  11 - 59 U/L   US Abdomen Complete  11/04/2013   CLINICAL DATA:  Right upper quadrant and epigastric pain.  EXAM: ULTRASOUND ABDOMEN COMPLETE  COMPARISON:  07/15/2006  FINDINGS: Gallbladder:  No gallstones or wall thickening visualized. No sonographic Murphy sign noted.  Common bile duct:  Diameter: 3.7 mm proximally.  Distal duct not seen.  Liver:  Diffusely increased echogenicity.  No focal lesion identified.  IVC:  No abnormality visualized.  Pancreas:  Obscured by bowel gas.  Spleen:  Size and appearance within normal limits.  Right Kidney:  Length: 11.6 cm. Echogenicity within normal limits. No mass or hydronephrosis visualized.  Left Kidney:  Length: 9.8 cm. Echogenicity within normal limits. No mass or hydronephrosis visualized.  Abdominal aorta:  Non dilated proximally.  Mid and distal portions obscured.  Other findings:  None.  IMPRESSION: 1. Unremarkable appearance of the gallbladder. No biliary dilatation. 2. Diffusely increased liver echogenicity, compatible with steatosis.   Electronically Signed   By: Logan Bores   On: 11/04/2013 15:38     The patients [place] is unremarkable, no cholecystitis or cholelithiasis. She requests one more dose of medication. She has been given a referral to GI. Pt is in no acute distress and VSS.  38 y.o.Kendra Stein's evaluation in the Emergency Department is complete. It has been determined that no acute conditions requiring further emergency intervention are present at this time. The patient/guardian have been advised of the diagnosis and plan. We have discussed signs and symptoms that warrant return to the ED, such as changes or worsening in symptoms.  Vital signs are stable at discharge. Filed Vitals:   11/04/13 1515  BP: 118/67  Pulse: 82  Temp:   Resp: 18     Patient/guardian has voiced understanding and agreed to follow-up with the PCP or specialist.    Linus Mako, PA-C 11/04/13 1555

## 2013-11-04 NOTE — Discharge Instructions (Signed)
Abdominal Pain, Women °Abdominal (stomach, pelvic, or belly) pain can be caused by many things. It is important to tell your doctor: °· The location of the pain. °· Does it come and go or is it present all the time? °· Are there things that start the pain (eating certain foods, exercise)? °· Are there other symptoms associated with the pain (fever, nausea, vomiting, diarrhea)? °All of this is helpful to know when trying to find the cause of the pain. °CAUSES  °· Stomach: virus or bacteria infection, or ulcer. °· Intestine: appendicitis (inflamed appendix), regional ileitis (Crohn's disease), ulcerative colitis (inflamed colon), irritable bowel syndrome, diverticulitis (inflamed diverticulum of the colon), or cancer of the stomach or intestine. °· Gallbladder disease or stones in the gallbladder. °· Kidney disease, kidney stones, or infection. °· Pancreas infection or cancer. °· Fibromyalgia (pain disorder). °· Diseases of the female organs: °· Uterus: fibroid (non-cancerous) tumors or infection. °· Fallopian tubes: infection or tubal pregnancy. °· Ovary: cysts or tumors. °· Pelvic adhesions (scar tissue). °· Endometriosis (uterus lining tissue growing in the pelvis and on the pelvic organs). °· Pelvic congestion syndrome (female organs filling up with blood just before the menstrual period). °· Pain with the menstrual period. °· Pain with ovulation (producing an egg). °· Pain with an IUD (intrauterine device, birth control) in the uterus. °· Cancer of the female organs. °· Functional pain (pain not caused by a disease, may improve without treatment). °· Psychological pain. °· Depression. °DIAGNOSIS  °Your doctor will decide the seriousness of your pain by doing an examination. °· Blood tests. °· X-rays. °· Ultrasound. °· CT scan (computed tomography, special type of X-ray). °· MRI (magnetic resonance imaging). °· Cultures, for infection. °· Barium enema (dye inserted in the large intestine, to better view it with  X-rays). °· Colonoscopy (looking in intestine with a lighted tube). °· Laparoscopy (minor surgery, looking in abdomen with a lighted tube). °· Major abdominal exploratory surgery (looking in abdomen with a large incision). °TREATMENT  °The treatment will depend on the cause of the pain.  °· Many cases can be observed and treated at home. °· Over-the-counter medicines recommended by your caregiver. °· Prescription medicine. °· Antibiotics, for infection. °· Birth control pills, for painful periods or for ovulation pain. °· Hormone treatment, for endometriosis. °· Nerve blocking injections. °· Physical therapy. °· Antidepressants. °· Counseling with a psychologist or psychiatrist. °· Minor or major surgery. °HOME CARE INSTRUCTIONS  °· Do not take laxatives, unless directed by your caregiver. °· Take over-the-counter pain medicine only if ordered by your caregiver. Do not take aspirin because it can cause an upset stomach or bleeding. °· Try a clear liquid diet (broth or water) as ordered by your caregiver. Slowly move to a bland diet, as tolerated, if the pain is related to the stomach or intestine. °· Have a thermometer and take your temperature several times a day, and record it. °· Bed rest and sleep, if it helps the pain. °· Avoid sexual intercourse, if it causes pain. °· Avoid stressful situations. °· Keep your follow-up appointments and tests, as your caregiver orders. °· If the pain does not go away with medicine or surgery, you may try: °· Acupuncture. °· Relaxation exercises (yoga, meditation). °· Group therapy. °· Counseling. °SEEK MEDICAL CARE IF:  °· You notice certain foods cause stomach pain. °· Your home care treatment is not helping your pain. °· You need stronger pain medicine. °· You want your IUD removed. °· You feel faint or   lightheaded. °· You develop nausea and vomiting. °· You develop a rash. °· You are having side effects or an allergy to your medicine. °SEEK IMMEDIATE MEDICAL CARE IF:  °· Your  pain does not go away or gets worse. °· You have a fever. °· Your pain is felt only in portions of the abdomen. The right side could possibly be appendicitis. The left lower portion of the abdomen could be colitis or diverticulitis. °· You are passing blood in your stools (bright red or black tarry stools, with or without vomiting). °· You have blood in your urine. °· You develop chills, with or without a fever. °· You pass out. °MAKE SURE YOU:  °· Understand these instructions. °· Will watch your condition. °· Will get help right away if you are not doing well or get worse. °Document Released: 04/25/2007 Document Revised: 09/20/2011 Document Reviewed: 05/15/2009 °Exit[employer]® Patient Information ©2014 [employer]. ° °

## 2013-11-04 NOTE — ED Notes (Signed)
Pt presents with complaint of abdominal pain that is centralized and also to the right lower quadrant that began yesterday evening. Pt denies nausea, emesis, diarrhea, vaginal bleeding, and vaginal discharge. Pt is A/O x4, in NAD, and vitals are WDL.

## 2013-11-06 ENCOUNTER — Ambulatory Visit: Payer: Self-pay | Admitting: Physician Assistant

## 2013-11-10 NOTE — ED Provider Notes (Signed)
Medical screening examination/treatment/procedure(s) were performed by non-physician practitioner and as supervising physician I was immediately available for consultation/collaboration.   EKG Interpretation None        Mirna Mires, MD 11/10/13 [phone]

## 2013-11-14 ENCOUNTER — Encounter (INDEPENDENT_AMBULATORY_CARE_PROVIDER_SITE_OTHER): Payer: Self-pay | Admitting: Surgery

## 2013-11-27 ENCOUNTER — Ambulatory Visit (INDEPENDENT_AMBULATORY_CARE_PROVIDER_SITE_OTHER): Payer: BC Managed Care – PPO | Admitting: Surgery

## 2013-12-07 ENCOUNTER — Ambulatory Visit (INDEPENDENT_AMBULATORY_CARE_PROVIDER_SITE_OTHER): Payer: BC Managed Care – PPO | Admitting: Surgery

## 2013-12-07 ENCOUNTER — Encounter (INDEPENDENT_AMBULATORY_CARE_PROVIDER_SITE_OTHER): Payer: Self-pay | Admitting: Surgery

## 2013-12-07 VITALS — BP 122/74 | HR 96 | Temp 98.1°F | Ht 62.0 in | Wt 219.0 lb

## 2013-12-07 DIAGNOSIS — K429 Umbilical hernia without obstruction or gangrene: Secondary | ICD-10-CM

## 2013-12-07 NOTE — Progress Notes (Signed)
Patient ID: Kendra Stein, female   DOB: May 30, 1976, 38 y.o.   MRN: [id number]  Chief Complaint  Patient presents with  . Umbilical Hernia    HPI Kendra Stein is a 38 y.o. female.   HPI This is a very pleasant female referred to me by Dr. Everett Graff for evaluation of umbilical hernia. She recently developed pain around her umbilicus. It was sharp and severe. She noticed a little lump at the umbilicus and had erythema and tenderness. This has since resolved and she is pain-free today. She denies nausea or vomiting or throat symptoms. Past Medical History  Diagnosis Date  . Abnormal Pap smear   . Cervical polyp   . Headache(784.0)   . Toxemia in pregnancy   . Depression   . Migraines   . Vitamin D deficiency   . Morbid obesity   . Prediabetes     Past Surgical History  Procedure Laterality Date  . Cryotherapy    . Endometrial ablation    . Tubal ligation    . Wisdom tooth extraction    . Cesarean section      Family History  Problem Relation Age of Onset  . Anesthesia problems Neg Hx   . Hypotension Neg Hx   . Malignant hyperthermia Neg Hx   . Pseudochol deficiency Neg Hx   . Hypertension Mother   . Arthritis Mother   . Hypertension Brother   . Asthma Daughter     Social History History  Substance Use Topics  . Smoking status: Never Smoker   . Smokeless tobacco: Never Used  . Alcohol Use: Yes     Comment: occasionally    No Known Allergies  Current Outpatient Prescriptions  Medication Sig Dispense Refill  . CYANOCOBALAMIN PO Take 1 tablet by mouth daily.      . meloxicam (MOBIC) 15 MG tablet       . ondansetron (ZOFRAN) 4 MG tablet Take 1 tablet (4 mg total) by mouth every 6 (six) hours.  12 tablet  0  . oxyCODONE-acetaminophen (PERCOCET/ROXICET) 5-325 MG per tablet Take 1-2 tablets by mouth every 6 (six) hours as needed for severe pain.  20 tablet  0  . phentermine (ADIPEX-P) 37.5 MG tablet Take 1 tablet (37.5 mg total) by mouth daily before  breakfast.  30 tablet  0  . SUMAtriptan (IMITREX) 100 MG tablet Take 1 tablet (100 mg total) by mouth once as needed for migraine. May repeat in 2 hours if headache persists or recurs.  6 tablet  2   No current facility-administered medications for this visit.    Review of Systems Review of Systems  Constitutional: Negative for fever, chills and unexpected weight change.  HENT: Negative for congestion, hearing loss, sore throat, trouble swallowing and voice change.   Eyes: Negative for visual disturbance.  Respiratory: Negative for cough and wheezing.   Cardiovascular: Negative for chest pain, palpitations and leg swelling.  Gastrointestinal: Positive for abdominal pain. Negative for nausea, vomiting, diarrhea, constipation, blood in stool, abdominal distention and anal bleeding.  Genitourinary: Negative for hematuria, vaginal bleeding and difficulty urinating.  Musculoskeletal: Negative for arthralgias.  Skin: Negative for rash and wound.  Neurological: Negative for seizures, syncope and headaches.  Hematological: Negative for adenopathy. Does not bruise/bleed easily.  Psychiatric/Behavioral: Negative for confusion.    Blood pressure 122/74, pulse 96, temperature 98.1 F (36.7 C), height 5\' 2"  (1.575 m), weight 219 lb (99.338 kg).  Physical Exam Physical Exam  Constitutional: She is oriented to  person, place, and time. She appears well-developed. No distress.  obese  HENT:  Head: Normocephalic and atraumatic.  Right Ear: External ear normal.  Left Ear: External ear normal.  Nose: Nose normal.  Mouth/Throat: Oropharynx is clear and moist. No oropharyngeal exudate.  Eyes: Conjunctivae are normal. Pupils are equal, round, and reactive to light. No scleral icterus.  Neck: Normal range of motion. No tracheal deviation present.  Cardiovascular: Normal rate, regular rhythm, normal heart sounds and intact distal pulses.   No murmur heard. Pulmonary/Chest: Effort normal and breath  sounds normal. No respiratory distress. She has no wheezes.  Abdominal: Soft. Bowel sounds are normal. She exhibits no distension. There is no tenderness. There is no rebound.  There is a small hernia at the umbilicus which cannot be reduced. It is nontender and there is no erythema. I suspect it contained incarcerated omentum  Musculoskeletal: Normal range of motion. She exhibits no edema and no tenderness.  Neurological: She is alert and oriented to person, place, and time.  Skin: Skin is warm and dry. No rash noted. She is not diaphoretic. No erythema.  Psychiatric: Her behavior is normal. Judgment normal.    Data Reviewed   Assessment    Umbilical hernia     Plan    Because of her recent pain and erythema, repair of the umbilical hernia is recommended. I discussed this with the patient in detail. I discussed the diagnosis and the anatomy. I discussed the risks of surgery which includes but is not limited to bleeding, infection, recurrence, use of mesh, etc. I also discussed postoperative recovery. She understands and wishes to proceed with umbilical hernia repair        Harl Bowie 12/07/2013, 11:55 AM

## 2013-12-08 ENCOUNTER — Other Ambulatory Visit: Payer: Self-pay | Admitting: Physician Assistant

## 2013-12-12 ENCOUNTER — Other Ambulatory Visit: Payer: Self-pay | Admitting: Physician Assistant

## 2013-12-12 MED ORDER — PHENTERMINE HCL 37.5 MG PO TABS: 37.5000 mg | ORAL_TABLET | Freq: Every day | ORAL | Status: DC

## 2013-12-14 ENCOUNTER — Encounter (HOSPITAL_COMMUNITY): Payer: Self-pay | Admitting: Pharmacy Technician

## 2013-12-19 ENCOUNTER — Ambulatory Visit (INDEPENDENT_AMBULATORY_CARE_PROVIDER_SITE_OTHER): Payer: BC Managed Care – PPO | Admitting: Physician Assistant

## 2013-12-19 ENCOUNTER — Encounter: Payer: Self-pay | Admitting: Physician Assistant

## 2013-12-19 VITALS — BP 120/80 | HR 88 | Temp 97.7°F | Resp 16 | Wt 220.0 lb

## 2013-12-19 DIAGNOSIS — N3000 Acute cystitis without hematuria: Secondary | ICD-10-CM

## 2013-12-19 MED ORDER — FLUCONAZOLE 150 MG PO TABS: 150.0000 mg | ORAL_TABLET | Freq: Every day | ORAL | Status: DC

## 2013-12-19 MED ORDER — CIPROFLOXACIN HCL 500 MG PO TABS: 500.0000 mg | ORAL_TABLET | Freq: Two times a day (BID) | ORAL | Status: AC

## 2013-12-19 NOTE — Patient Instructions (Signed)
Urinary Tract Infection  Urinary tract infections (UTIs) can develop anywhere along your urinary tract. Your urinary tract is your body's drainage system for removing wastes and extra water. Your urinary tract includes two kidneys, two ureters, a bladder, and a urethra. Your kidneys are a pair of bean-shaped organs. Each kidney is about the size of your fist. They are located below your ribs, one on each side of your spine.  CAUSES  Infections are caused by microbes, which are microscopic organisms, including fungi, viruses, and bacteria. These organisms are so small that they can only be seen through a microscope. Bacteria are the microbes that most commonly cause UTIs.  SYMPTOMS   Symptoms of UTIs may vary by age and gender of the patient and by the location of the infection. Symptoms in young women typically include a frequent and intense urge to urinate and a painful, burning feeling in the bladder or urethra during urination. Older women and men are more likely to be tired, shaky, and weak and have muscle aches and abdominal pain. A fever may mean the infection is in your kidneys. Other symptoms of a kidney infection include pain in your back or sides below the ribs, nausea, and vomiting.  DIAGNOSIS  To diagnose a UTI, your caregiver will ask you about your symptoms. Your caregiver also will ask to provide a urine sample. The urine sample will be tested for bacteria and white blood cells. White blood cells are made by your body to help fight infection.  TREATMENT   Typically, UTIs can be treated with medication. Because most UTIs are caused by a bacterial infection, they usually can be treated with the use of antibiotics. The choice of antibiotic and length of treatment depend on your symptoms and the type of bacteria causing your infection.  HOME CARE INSTRUCTIONS   If you were prescribed antibiotics, take them exactly as your caregiver instructs you. Finish the medication even if you feel better after you  have only taken some of the medication.   Drink enough water and fluids to keep your urine clear or pale yellow.   Avoid caffeine, tea, and carbonated beverages. They tend to irritate your bladder.   Empty your bladder often. Avoid holding urine for long periods of time.   Empty your bladder before and after sexual intercourse.   After a bowel movement, women should cleanse from front to back. Use each tissue only once.  SEEK MEDICAL CARE IF:    You have back pain.   You develop a fever.   Your symptoms do not begin to resolve within 3 days.  SEEK IMMEDIATE MEDICAL CARE IF:    You have severe back pain or lower abdominal pain.   You develop chills.   You have nausea or vomiting.   You have continued burning or discomfort with urination.  MAKE SURE YOU:    Understand these instructions.   Will watch your condition.   Will get help right away if you are not doing well or get worse.  Document Released: 04/07/2005 Document Revised: 12/28/2011 Document Reviewed: 08/06/2011  [employer] Patient Information 2014 [employer].

## 2013-12-19 NOTE — Progress Notes (Signed)
   Subjective:    Patient ID: Kendra Stein, female    DOB: 11/28/1975, 38 y.o.   MRN: [id number]  HPI 38 y.o. female with one episode of dysuria, urgency, decreased amount, frequency, urine has a strong odor to it. She states there is some itching but there is no discharge.   She also states that she has an umbilical hernia that she is going to have to have a repair with Dr. Ninfa Linden on June 18th.    Review of Systems  Constitutional: Negative.  Negative for fever.  HENT: Negative.   Respiratory: Negative.   Cardiovascular: Negative.   Gastrointestinal: Negative.   Genitourinary: Positive for dysuria, urgency and frequency. Negative for flank pain, decreased urine volume, vaginal bleeding, vaginal discharge, enuresis, genital sores, vaginal pain, menstrual problem and pelvic pain.  Musculoskeletal: Negative for arthralgias, back pain and gait problem.  Skin: Negative.  Negative for rash.  Neurological: Negative.   Psychiatric/Behavioral: Negative.        Objective:   Physical Exam  Constitutional: She is oriented to person, place, and time. She appears well-developed and well-nourished.  Neck: Normal range of motion. Neck supple.  Cardiovascular: Normal rate and regular rhythm.   Pulmonary/Chest: Effort normal and breath sounds normal.  Abdominal: Soft. Bowel sounds are normal. She exhibits no distension and no mass. There is tenderness. There is no rebound and no guarding.  Musculoskeletal: Normal range of motion. She exhibits no tenderness.  Neurological: She is alert and oriented to person, place, and time.  Skin: Skin is warm and dry.      Assessment & Plan:  UTI- Cipro, diflucan, increase water. If any worsening back pain, fever, chills go to ER. If symptoms do not resolve follow up back in the office.

## 2013-12-20 LAB — URINALYSIS, ROUTINE W REFLEX MICROSCOPIC
BILIRUBIN URINE: NEGATIVE
Glucose, UA: NEGATIVE mg/dL
Hgb urine dipstick: NEGATIVE
KETONES UR: NEGATIVE mg/dL
Leukocytes, UA: NEGATIVE
NITRITE: NEGATIVE
PH: 5.5 (ref 5.0–8.0)
Protein, ur: NEGATIVE mg/dL
SPECIFIC GRAVITY, URINE: 1.01 (ref 1.005–1.030)
Urobilinogen, UA: 0.2 mg/dL (ref 0.0–1.0)

## 2013-12-21 ENCOUNTER — Encounter (HOSPITAL_COMMUNITY)
Admission: RE | Admit: 2013-12-21 | Discharge: 2013-12-21 | Disposition: A | Discharge status: Home / Self Care | Payer: BC Managed Care – PPO | Source: Ambulatory Visit | Attending: Surgery | Admitting: Surgery

## 2013-12-21 ENCOUNTER — Encounter (HOSPITAL_COMMUNITY): Payer: Self-pay

## 2013-12-21 DIAGNOSIS — Z01812 Encounter for preprocedural laboratory examination: Secondary | ICD-10-CM | POA: Insufficient documentation

## 2013-12-21 HISTORY — DX: Umbilical hernia without obstruction or gangrene: K42.9

## 2013-12-21 HISTORY — DX: Urinary tract infection, site not specified: N39.0

## 2013-12-21 LAB — BASIC METABOLIC PANEL
BUN: 11 mg/dL (ref 6–23)
CO2: 25 meq/L (ref 19–32)
Calcium: 9.4 mg/dL (ref 8.4–10.5)
Chloride: 102 mEq/L (ref 96–112)
Creatinine, Ser: 0.77 mg/dL (ref 0.50–1.10)
GFR calc Af Amer: >90 mL/min (ref >90)
GFR calc non Af Amer: >90 mL/min (ref >90)
Glucose, Bld: 88 mg/dL (ref 70–99)
Potassium: 4 mEq/L (ref 3.7–5.3)
SODIUM: 138 meq/L (ref 137–147)

## 2013-12-21 LAB — HCG, SERUM, QUALITATIVE: Preg, Serum: NEGATIVE

## 2013-12-21 LAB — CBC
HCT: 35.7 % — ABNORMAL LOW (ref 36.0–46.0)
HEMOGLOBIN: 11.9 g/dL — AB (ref 12.0–15.0)
MCH: 26.6 pg (ref 26.0–34.0)
MCHC: 33.3 g/dL (ref 30.0–36.0)
MCV: 79.7 fL (ref 78.0–100.0)
PLATELETS: 297 10*3/uL (ref 150–400)
RBC: 4.48 MIL/uL (ref 3.87–5.11)
RDW: 13.1 % (ref 11.5–15.5)
WBC: 8.6 10*3/uL (ref 4.0–10.5)

## 2013-12-21 NOTE — Pre-Procedure Instructions (Signed)
EKG AND CXR NOT NEEDED PREOP - PER ANESTHESIOLOGIST'S GUIDELINES. 

## 2013-12-21 NOTE — Patient Instructions (Signed)
YOUR SURGERY IS SCHEDULED AT [facility]  ON:  Thursday  6/18  REPORT TO  SHORT STAY CENTER AT:  7:45 AM   PLEASE COME IN THE [place] ENTRANCE AND FOLLOW SIGNS TO SHORT STAY CENTER.  DO NOT EAT OR DRINK ANYTHING AFTER MIDNIGHT THE NIGHT BEFORE YOUR SURGERY.  YOU MAY BRUSH YOUR TEETH, RINSE OUT YOUR MOUTH--BUT NO WATER, NO FOOD, NO CHEWING GUM, NO MINTS, NO CANDIES, NO CHEWING TOBACCO.  PLEASE TAKE THE FOLLOWING MEDICATIONS THE AM OF YOUR SURGERY WITH A FEW SIPS OF WATER:  NO MEDICATIONS TO TAKE  I DO NOT BRING VALUABLES, MONEY, CREDIT CARDS.  DO NOT WEAR JEWELRY, MAKE-UP, NAIL POLISH AND NO METAL PINS OR CLIPS IN YOUR HAIR. CONTACT LENS, DENTURES / PARTIALS, GLASSES SHOULD NOT BE WORN TO SURGERY AND IN MOST CASES-HEARING AIDS WILL NEED TO BE REMOVED.  BRING YOUR GLASSES CASE, ANY EQUIPMENT NEEDED FOR YOUR CONTACT LENS. FOR PATIENTS ADMITTED TO THE HOSPITAL--CHECK OUT TIME THE DAY OF DISCHARGE IS 11:00 AM.  ALL INPATIENT ROOMS ARE PRIVATE - WITH BATHROOM, TELEPHONE, TELEVISION AND WIFI INTERNET.  IF YOU ARE BEING DISCHARGED THE SAME DAY OF YOUR SURGERY--YOU CAN NOT DRIVE YOURSELF HOME--AND SHOULD NOT GO HOME ALONE BY TAXI OR BUS.  NO DRIVING OR OPERATING MACHINERY, OR MAKING LEGAL DECISIONS FOR 24 HOURS FOLLOWING ANESTHESIA / PAIN MEDICATIONS.  PLEASE MAKE ARRANGEMENTS FOR SOMEONE TO BE WITH YOU AT HOME THE FIRST 24 HOURS AFTER SURGERY. RESPONSIBLE DRIVER'S NAME / PHONE  PT'S HUSBAND ERIC WILL BE WITH HER  [phone]  OR  [phone]                                                   PLEASE READ OVER ANY  FACT SHEETS THAT YOU WERE GIVEN: MRSA INFORMATION, BLOOD TRANSFUSION INFORMATION, INCENTIVE SPIROMETER INFORMATION.  PLEASE BE AWARE THAT YOU MAY NEED ADDITIONAL BLOOD DRAWN DAY OF YOUR SURGERY  _______________________________________________________________________   [facility] - Preparing for Surgery Before surgery, you can play an important role.  Because skin  is not sterile, your skin needs to be as free of germs as possible.  You can reduce the number of germs on your skin by washing with CHG (chlorahexidine gluconate) soap before surgery.  CHG is an antiseptic cleaner which kills germs and bonds with the skin to continue killing germs even after washing. Please DO NOT use if you have an allergy to CHG or antibacterial soaps.  If your skin becomes reddened/irritated stop using the CHG and inform your nurse when you arrive at Short Stay. Do not shave (including legs and underarms) for at least 48 hours prior to the first CHG shower.  You may shave your face/neck. Please follow these instructions carefully:  1.  Shower with CHG Soap the night before surgery and the  morning of Surgery.  2.  If you choose to wash your hair, wash your hair first as usual with your  normal  shampoo.  3.  After you shampoo, rinse your hair and body thoroughly to remove the  shampoo.                           4.  Use CHG as you would any other liquid soap.  You can apply chg directly  to the skin  and wash                       Gently with a scrungie or clean washcloth.  5.  Apply the CHG Soap to your body ONLY FROM THE NECK DOWN.   Do not use on face/ open                           Wound or open sores. Avoid contact with eyes, ears mouth and genitals (private parts).                       Wash face,  Genitals (private parts) with your normal soap.             6.  Wash thoroughly, paying special attention to the area where your surgery  will be performed.  7.  Thoroughly rinse your body with warm water from the neck down.  8.  DO NOT shower/wash with your normal soap after using and rinsing off  the CHG Soap.                9.  Pat yourself dry with a clean towel.            10.  Wear clean pajamas.            11.  Place clean sheets on your bed the night of your first shower and do not  sleep with pets. Day of Surgery : Do not apply any lotions/deodorants the morning of  surgery.  Please wear clean clothes to the hospital/surgery center.  FAILURE TO FOLLOW THESE INSTRUCTIONS MAY RESULT IN THE CANCELLATION OF YOUR SURGERY PATIENT SIGNATURE_________________________________  NURSE SIGNATURE__________________________________  ________________________________________________________________________

## 2013-12-22 LAB — URINE CULTURE: Colony Count: >=100000

## 2013-12-26 NOTE — H&P (Signed)
Chief Complaint   Patient presents with   .  Umbilical Hernia   HPI  Kendra Stein is a 38 y.o. female.  HPI  This is a very pleasant female referred to me by Dr. Everett Graff for evaluation of umbilical hernia. She recently developed pain around her umbilicus. It was sharp and severe. She noticed a little lump at the umbilicus and had erythema and tenderness. This has since resolved and she is pain-free today. She denies nausea or vomiting or throat symptoms.  Past Medical History   Diagnosis  Date   .  Abnormal Pap smear    .  Cervical polyp    .  Headache(784.0)    .  Toxemia in pregnancy    .  Depression    .  Migraines    .  Vitamin D deficiency    .  Morbid obesity    .  Prediabetes     Past Surgical History   Procedure  Laterality  Date   .  Cryotherapy     .  Endometrial ablation     .  Tubal ligation     .  Wisdom tooth extraction     .  Cesarean section      Family History   Problem  Relation  Age of Onset   .  Anesthesia problems  Neg Hx    .  Hypotension  Neg Hx    .  Malignant hyperthermia  Neg Hx    .  Pseudochol deficiency  Neg Hx    .  Hypertension  Mother    .  Arthritis  Mother    .  Hypertension  Brother    .  Asthma  Daughter    Social History  History   Substance Use Topics   .  Smoking status:  Never Smoker   .  Smokeless tobacco:  Never Used   .  Alcohol Use:  Yes      Comment: occasionally   No Known Allergies  Current Outpatient Prescriptions   Medication  Sig  Dispense  Refill   .  CYANOCOBALAMIN PO  Take 1 tablet by mouth daily.     .  meloxicam (MOBIC) 15 MG tablet      .  ondansetron (ZOFRAN) 4 MG tablet  Take 1 tablet (4 mg total) by mouth every 6 (six) hours.  12 tablet  0   .  oxyCODONE-acetaminophen (PERCOCET/ROXICET) 5-325 MG per tablet  Take 1-2 tablets by mouth every 6 (six) hours as needed for severe pain.  20 tablet  0   .  phentermine (ADIPEX-P) 37.5 MG tablet  Take 1 tablet (37.5 mg total) by mouth daily before  breakfast.  30 tablet  0   .  SUMAtriptan (IMITREX) 100 MG tablet  Take 1 tablet (100 mg total) by mouth once as needed for migraine. May repeat in 2 hours if headache persists or recurs.  6 tablet  2    No current facility-administered medications for this visit.   Review of Systems  Review of Systems  Constitutional: Negative for fever, chills and unexpected weight change.  HENT: Negative for congestion, hearing loss, sore throat, trouble swallowing and voice change.  Eyes: Negative for visual disturbance.  Respiratory: Negative for cough and wheezing.  Cardiovascular: Negative for chest pain, palpitations and leg swelling.  Gastrointestinal: Positive for abdominal pain. Negative for nausea, vomiting, diarrhea, constipation, blood in stool, abdominal distention and anal bleeding.  Genitourinary: Negative for hematuria, vaginal  bleeding and difficulty urinating.  Musculoskeletal: Negative for arthralgias.  Skin: Negative for rash and wound.  Neurological: Negative for seizures, syncope and headaches.  Hematological: Negative for adenopathy. Does not bruise/bleed easily.  Psychiatric/Behavioral: Negative for confusion.  Blood pressure 122/74, pulse 96, temperature 98.1 F (36.7 C), height 5\' 2"  (1.575 m), weight 219 lb (99.338 kg).  Physical Exam  Physical Exam  Constitutional: She is oriented to person, place, and time. She appears well-developed. No distress.  obese  HENT:  Head: Normocephalic and atraumatic.  Right Ear: External ear normal.  Left Ear: External ear normal.  Nose: Nose normal.  Mouth/Throat: Oropharynx is clear and moist. No oropharyngeal exudate.  Eyes: Conjunctivae are normal. Pupils are equal, round, and reactive to light. No scleral icterus.  Neck: Normal range of motion. No tracheal deviation present.  Cardiovascular: Normal rate, regular rhythm, normal heart sounds and intact distal pulses.  No murmur heard.  Pulmonary/Chest: Effort normal and breath sounds  normal. No respiratory distress. She has no wheezes.  Abdominal: Soft. Bowel sounds are normal. She exhibits no distension. There is no tenderness. There is no rebound.  There is a small hernia at the umbilicus which cannot be reduced. It is nontender and there is no erythema. I suspect it contained incarcerated omentum  Musculoskeletal: Normal range of motion. She exhibits no edema and no tenderness.  Neurological: She is alert and oriented to person, place, and time.  Skin: Skin is warm and dry. No rash noted. She is not diaphoretic. No erythema.  Psychiatric: Her behavior is normal. Judgment normal.  Data Reviewed  Assessment  Umbilical hernia  Plan  Because of her recent pain and erythema, repair of the umbilical hernia is recommended. I discussed this with the patient in detail. I discussed the diagnosis and the anatomy. I discussed the risks of surgery which includes but is not limited to bleeding, infection, recurrence, use of mesh, etc. I also discussed postoperative recovery. She understands and wishes to proceed with umbilical hernia repair

## 2013-12-27 ENCOUNTER — Encounter (HOSPITAL_COMMUNITY): Payer: Self-pay

## 2013-12-27 ENCOUNTER — Ambulatory Visit (HOSPITAL_COMMUNITY)
Admission: RE | Admit: 2013-12-27 | Discharge: 2013-12-27 | Disposition: A | Discharge status: Home / Self Care | Payer: BC Managed Care – PPO | Source: Ambulatory Visit | Attending: Surgery | Admitting: Surgery

## 2013-12-27 ENCOUNTER — Ambulatory Visit (HOSPITAL_COMMUNITY): Payer: BC Managed Care – PPO | Admitting: Anesthesiology

## 2013-12-27 ENCOUNTER — Encounter (HOSPITAL_COMMUNITY): Payer: BC Managed Care – PPO | Admitting: Anesthesiology

## 2013-12-27 ENCOUNTER — Encounter (HOSPITAL_COMMUNITY)
Admission: RE | Disposition: A | Discharge status: Home / Self Care | Payer: Self-pay | Source: Ambulatory Visit | Attending: Surgery

## 2013-12-27 DIAGNOSIS — I10 Essential (primary) hypertension: Secondary | ICD-10-CM | POA: Insufficient documentation

## 2013-12-27 DIAGNOSIS — K429 Umbilical hernia without obstruction or gangrene: Secondary | ICD-10-CM

## 2013-12-27 DIAGNOSIS — K420 Umbilical hernia with obstruction, without gangrene: Secondary | ICD-10-CM | POA: Insufficient documentation

## 2013-12-27 DIAGNOSIS — G43909 Migraine, unspecified, not intractable, without status migrainosus: Secondary | ICD-10-CM | POA: Insufficient documentation

## 2013-12-27 DIAGNOSIS — Z79899 Other long term (current) drug therapy: Secondary | ICD-10-CM | POA: Insufficient documentation

## 2013-12-27 HISTORY — PX: UMBILICAL HERNIA REPAIR: SHX196

## 2013-12-27 SURGERY — REPAIR, HERNIA, UMBILICAL, ADULT
Anesthesia: General

## 2013-12-27 MED ORDER — KETOROLAC TROMETHAMINE 30 MG/ML IJ SOLN
INTRAMUSCULAR | Status: DC | PRN
  Administered 2013-12-27: 30 mg via INTRAVENOUS

## 2013-12-27 MED ORDER — KETOROLAC TROMETHAMINE 30 MG/ML IJ SOLN
INTRAMUSCULAR | Status: AC
  Filled 2013-12-27: qty 1

## 2013-12-27 MED ORDER — PROPOFOL 10 MG/ML IV BOLUS
INTRAVENOUS | Status: DC | PRN
  Administered 2013-12-27: 200 mg via INTRAVENOUS

## 2013-12-27 MED ORDER — DEXAMETHASONE SODIUM PHOSPHATE 10 MG/ML IJ SOLN
INTRAMUSCULAR | Status: AC
  Filled 2013-12-27: qty 1

## 2013-12-27 MED ORDER — OXYCODONE HCL 5 MG PO TABS
5.0000 mg | ORAL_TABLET | ORAL | Status: DC | PRN
  Administered 2013-12-27: 5 mg via ORAL
  Filled 2013-12-27: qty 1

## 2013-12-27 MED ORDER — HYDROCODONE-ACETAMINOPHEN 5-325 MG PO TABS: 1.0000 | ORAL_TABLET | ORAL | Status: DC | PRN

## 2013-12-27 MED ORDER — FENTANYL CITRATE 0.05 MG/ML IJ SOLN
INTRAMUSCULAR | Status: AC
  Filled 2013-12-27: qty 2

## 2013-12-27 MED ORDER — MIDAZOLAM HCL 5 MG/5ML IJ SOLN
INTRAMUSCULAR | Status: DC | PRN
  Administered 2013-12-27: 2 mg via INTRAVENOUS

## 2013-12-27 MED ORDER — ONDANSETRON HCL 4 MG/2ML IJ SOLN
INTRAMUSCULAR | Status: DC | PRN
  Administered 2013-12-27: 4 mg via INTRAVENOUS

## 2013-12-27 MED ORDER — 0.9 % SODIUM CHLORIDE (POUR BTL) OPTIME
TOPICAL | Status: DC | PRN
  Administered 2013-12-27: 1000 mL

## 2013-12-27 MED ORDER — LACTATED RINGERS IV SOLN
INTRAVENOUS | Status: DC
  Administered 2013-12-27: 1000 mL via INTRAVENOUS

## 2013-12-27 MED ORDER — SODIUM CHLORIDE 0.9 % IV SOLN: 250.0000 mL | INTRAVENOUS | Status: DC | PRN

## 2013-12-27 MED ORDER — SODIUM CHLORIDE 0.9 % IJ SOLN: 3.0000 mL | Freq: Two times a day (BID) | INTRAMUSCULAR | Status: DC

## 2013-12-27 MED ORDER — BUPIVACAINE-EPINEPHRINE 0.25% -1:200000 IJ SOLN
INTRAMUSCULAR | Status: DC | PRN
  Administered 2013-12-27: 20 mL

## 2013-12-27 MED ORDER — FENTANYL CITRATE 0.05 MG/ML IJ SOLN
INTRAMUSCULAR | Status: DC | PRN
  Administered 2013-12-27: 50 ug via INTRAVENOUS
  Administered 2013-12-27: 100 ug via INTRAVENOUS
  Administered 2013-12-27: 50 ug via INTRAVENOUS

## 2013-12-27 MED ORDER — PROPOFOL 10 MG/ML IV BOLUS
INTRAVENOUS | Status: AC
  Filled 2013-12-27: qty 20

## 2013-12-27 MED ORDER — DEXAMETHASONE SODIUM PHOSPHATE 10 MG/ML IJ SOLN
INTRAMUSCULAR | Status: DC | PRN
  Administered 2013-12-27: 10 mg via INTRAVENOUS

## 2013-12-27 MED ORDER — CEFAZOLIN SODIUM-DEXTROSE 2-3 GM-% IV SOLR
2.0000 g | INTRAVENOUS | Status: AC
  Administered 2013-12-27: 2 g via INTRAVENOUS

## 2013-12-27 MED ORDER — BUPIVACAINE-EPINEPHRINE (PF) 0.25% -1:200000 IJ SOLN
INTRAMUSCULAR | Status: AC
  Filled 2013-12-27: qty 30

## 2013-12-27 MED ORDER — ACETAMINOPHEN 650 MG RE SUPP
650.0000 mg | RECTAL | Status: DC | PRN
  Filled 2013-12-27: qty 1

## 2013-12-27 MED ORDER — ACETAMINOPHEN 325 MG PO TABS: 650.0000 mg | ORAL_TABLET | ORAL | Status: DC | PRN

## 2013-12-27 MED ORDER — HYDROMORPHONE HCL PF 1 MG/ML IJ SOLN
INTRAMUSCULAR | Status: AC
  Filled 2013-12-27: qty 1

## 2013-12-27 MED ORDER — SODIUM CHLORIDE 0.9 % IJ SOLN: 3.0000 mL | INTRAMUSCULAR | Status: DC | PRN

## 2013-12-27 MED ORDER — LIDOCAINE HCL (CARDIAC) 20 MG/ML IV SOLN
INTRAVENOUS | Status: DC | PRN
Start: 2013-12-27 — End: 2013-12-27
  Administered 2013-12-27: 100 mg via INTRAVENOUS

## 2013-12-27 MED ORDER — MORPHINE SULFATE 10 MG/ML IJ SOLN: 1.0000 mg | INTRAMUSCULAR | Status: DC | PRN

## 2013-12-27 MED ORDER — PROMETHAZINE HCL 25 MG/ML IJ SOLN: 6.2500 mg | INTRAMUSCULAR | Status: DC | PRN

## 2013-12-27 MED ORDER — LACTATED RINGERS IV SOLN: INTRAVENOUS | Status: DC

## 2013-12-27 MED ORDER — CEFAZOLIN SODIUM-DEXTROSE 2-3 GM-% IV SOLR
INTRAVENOUS | Status: AC
  Filled 2013-12-27: qty 50

## 2013-12-27 MED ORDER — HYDROMORPHONE HCL PF 1 MG/ML IJ SOLN
0.2500 mg | INTRAMUSCULAR | Status: DC | PRN
  Administered 2013-12-27: 0.5 mg via INTRAVENOUS

## 2013-12-27 MED ORDER — LIDOCAINE HCL (CARDIAC) 20 MG/ML IV SOLN
INTRAVENOUS | Status: AC
  Filled 2013-12-27: qty 5

## 2013-12-27 MED ORDER — MIDAZOLAM HCL 2 MG/2ML IJ SOLN
INTRAMUSCULAR | Status: AC
  Filled 2013-12-27: qty 2

## 2013-12-27 MED ORDER — ONDANSETRON HCL 4 MG/2ML IJ SOLN
INTRAMUSCULAR | Status: AC
  Filled 2013-12-27: qty 2

## 2013-12-27 SURGICAL SUPPLY — 34 items
APL SKNCLS STERI-STRIP NONHPOA (GAUZE/BANDAGES/DRESSINGS) ×1
BENZOIN TINCTURE PRP APPL 2/3 (GAUZE/BANDAGES/DRESSINGS) ×2 IMPLANT
BINDER ABDOMINAL 12 ML 46-62 (SOFTGOODS) IMPLANT
BLADE EXTENDED COATED 6.5IN (ELECTRODE) IMPLANT
BLADE HEX COATED 2.75 (ELECTRODE) ×3 IMPLANT
CANISTER SUCTION 2500CC (MISCELLANEOUS) ×1 IMPLANT
CLOSURE WOUND 1/2 X4 (GAUZE/BANDAGES/DRESSINGS) ×1
DECANTER SPIKE VIAL GLASS SM (MISCELLANEOUS) IMPLANT
DRAPE LAPAROSCOPIC ABDOMINAL (DRAPES) ×3 IMPLANT
ELECT REM PT RETURN 9FT ADLT (ELECTROSURGICAL) ×3
ELECTRODE REM PT RTRN 9FT ADLT (ELECTROSURGICAL) ×1 IMPLANT
GAUZE SPONGE 4X4 12PLY STRL (GAUZE/BANDAGES/DRESSINGS) ×1 IMPLANT
GLOVE BIOGEL PI IND STRL 7.0 (GLOVE) ×1 IMPLANT
GLOVE BIOGEL PI INDICATOR 7.0 (GLOVE) ×2
GLOVE SURG SIGNA 7.5 PF LTX (GLOVE) ×6 IMPLANT
GOWN STRL REUS W/TWL LRG LVL3 (GOWN DISPOSABLE) ×3 IMPLANT
GOWN STRL REUS W/TWL XL LVL3 (GOWN DISPOSABLE) ×2 IMPLANT
KIT BASIN OR (CUSTOM PROCEDURE TRAY) ×5 IMPLANT
MANIFOLD NEPTUNE II (INSTRUMENTS) ×2 IMPLANT
NEEDLE HYPO 22GX1.5 SAFETY (NEEDLE) IMPLANT
NS IRRIG 1000ML POUR BTL (IV SOLUTION) ×3 IMPLANT
PACK GENERAL/GYN (CUSTOM PROCEDURE TRAY) ×3 IMPLANT
STAPLER VISISTAT 35W (STAPLE) IMPLANT
STRIP CLOSURE SKIN 1/2X4 (GAUZE/BANDAGES/DRESSINGS) ×1 IMPLANT
SUT MNCRL AB 4-0 PS2 18 (SUTURE) ×3 IMPLANT
SUT NOVA NAB DX-16 0-1 5-0 T12 (SUTURE) ×4 IMPLANT
SUT VIC AB 3-0 SH 27 (SUTURE) ×3
SUT VIC AB 3-0 SH 27X BRD (SUTURE) ×1 IMPLANT
SUT VICRYL 2 0 18  UND BR (SUTURE)
SUT VICRYL 2 0 18 UND BR (SUTURE) IMPLANT
SYR CONTROL 10ML LL (SYRINGE) ×2 IMPLANT
TOWEL OR 17X26 10 PK STRL BLUE (TOWEL DISPOSABLE) ×3 IMPLANT
TOWEL OR NON WOVEN STRL DISP B (DISPOSABLE) ×2 IMPLANT
TRAY FOLEY CATH 14FRSI W/METER (CATHETERS) IMPLANT

## 2013-12-27 NOTE — Transfer of Care (Signed)
Immediate Anesthesia Transfer of Care Note  Patient: Kendra Stein  Procedure(s) Performed: Procedure(s) (LRB): HERNIA REPAIR UMBILICAL ADULT (N/A)  Patient Location: PACU  Anesthesia Type: General  Level of Consciousness: sedated, patient cooperative and responds to stimulation  Airway & Oxygen Therapy: Patient Spontanous Breathing and Patient connected to face mask oxgen  Post-op Assessment: Report given to PACU RN and Post -op Vital signs reviewed and stable  Post vital signs: Reviewed and stable  Complications: No apparent anesthesia complications

## 2013-12-27 NOTE — Anesthesia Preprocedure Evaluation (Addendum)
Anesthesia Evaluation  Patient identified by MRN, date of birth, ID band Patient awake    Reviewed: Allergy & Precautions, H&P , NPO status , Patient's Chart, lab work & pertinent test results  Airway Mallampati: II TM Distance: >3 FB Neck ROM: Full    Dental  (+) Teeth Intact, Dental Advisory Given   Pulmonary neg pulmonary ROS,  breath sounds clear to auscultation  Pulmonary exam normal       Cardiovascular hypertension, negative cardio ROS  Rhythm:Regular Rate:Normal     Neuro/Psych  Headaches, Depression    GI/Hepatic negative GI ROS, Neg liver ROS,   Endo/Other  negative endocrine ROSMorbid obesity  Renal/GU negative Renal ROS  negative genitourinary   Musculoskeletal negative musculoskeletal ROS (+)   Abdominal   Peds  Hematology negative hematology ROS (+)   Anesthesia Other Findings   Reproductive/Obstetrics negative OB ROS                          Anesthesia Physical Anesthesia Plan  ASA: II  Anesthesia Plan: General   Post-op Pain Management:    Induction: Intravenous  Airway Management Planned: LMA  Additional Equipment:   Intra-op Plan:   Post-operative Plan: Extubation in OR  Informed Consent: I have reviewed the patients History and Physical, chart, labs and discussed the procedure including the risks, benefits and alternatives for the proposed anesthesia with the patient or authorized representative who has indicated his/her understanding and acceptance.   Dental advisory given  Plan Discussed with: CRNA  Anesthesia Plan Comments:        Anesthesia Quick Evaluation

## 2013-12-27 NOTE — Interval H&P Note (Signed)
History and Physical Interval Note: no change in H and P  12/27/2013 8:05 AM  Kendra Stein  has presented today for surgery, with the diagnosis of umbilical hernia  The various methods of treatment have been discussed with the patient and family. After consideration of risks, benefits and other options for treatment, the patient has consented to  Procedure(s): HERNIA REPAIR UMBILICAL ADULT (N/A) INSERTION OF MESH (N/A) as a surgical intervention .  The patient's history has been reviewed, patient examined, no change in status, stable for surgery.  I have reviewed the patient's chart and labs.  Questions were answered to the patient's satisfaction.     BLACKMAN,DOUGLAS A

## 2013-12-27 NOTE — Anesthesia Postprocedure Evaluation (Signed)
Anesthesia Post Note  Patient: Kendra Stein  Procedure(s) Performed: Procedure(s) (LRB): HERNIA REPAIR UMBILICAL ADULT (N/A)  Anesthesia type: General  Patient location: PACU  Post pain: Pain level controlled  Post assessment: Post-op Vital signs reviewed  Last Vitals:  Filed Vitals:   12/27/13 1242  BP: 106/66  Pulse: 80  Temp:   Resp: 16    Post vital signs: Reviewed  Level of consciousness: sedated  Complications: No apparent anesthesia complications

## 2013-12-27 NOTE — Op Note (Signed)
HERNIA REPAIR UMBILICAL ADULT  Procedure Note  Kendra Stein 12/27/2013   Pre-op Diagnosis: umbilical hernia     Post-op Diagnosis: same  Procedure(s): HERNIA REPAIR UMBILICAL ADULT  Surgeon(s): Harl Bowie, MD  Anesthesia: General  Staff:  Circulator: Barbee Shropshire, RN Scrub Person: [facility] [place], [place]  Estimated Blood Loss: Minimal                         [name]   Date: 12/27/2013  Time: 10:12 AM

## 2013-12-27 NOTE — Discharge Instructions (Signed)
[facility] _____[facility]  UMBILICAL OR INGUINAL HERNIA REPAIR: POST OP INSTRUCTIONS  Always review your discharge instruction sheet given to you by the facility where your surgery was performed. IF YOU HAVE DISABILITY OR FAMILY LEAVE FORMS, YOU MUST BRING THEM TO THE OFFICE FOR PROCESSING.   DO NOT GIVE THEM TO YOUR DOCTOR.  1. A  prescription for pain medication may be given to you upon discharge.  Take your pain medication as prescribed, if needed.  If narcotic pain medicine is not needed, then you may take acetaminophen (Tylenol) or ibuprofen (Advil) as needed. 2. Take your usually prescribed medications unless otherwise directed. 3. If you need a refill on your pain medication, please contact your pharmacy.  They will contact our office to request authorization. Prescriptions will not be filled after 5 pm or on week-ends. 4. You should follow a light diet the first 24 hours after arrival home, such as soup and crackers, etc.  Be sure to include lots of fluids daily.  Resume your normal diet the day after surgery. 5. Most patients will experience some swelling and bruising around the umbilicus or in the groin and scrotum.  Ice packs and reclining will help.  Swelling and bruising can take several days to resolve.  6. It is common to experience some constipation if taking pain medication after surgery.  Increasing fluid intake and taking a stool softener (such as Colace) will usually help or prevent this problem from occurring.  A mild laxative (Milk of Magnesia or Miralax) should be taken according to package directions if there are no bowel movements after 48 hours. 7. Unless discharge instructions indicate otherwise, you may remove your bandages 24-48 hours after surgery, and you may shower at that time.  You may have steri-strips (small skin tapes) in place directly over the incision.  These strips should be left on the skin for 7-10 days.  If your surgeon used skin glue on the  incision, you may shower in 24 hours.  The glue will flake off over the next 2-3 weeks.  Any sutures or staples will be removed at the office during your follow-up visit. 8. ACTIVITIES:  You may resume regular (light) daily activities beginning the next day--such as daily self-care, walking, climbing stairs--gradually increasing activities as tolerated.  You may have sexual intercourse when it is comfortable.  Refrain from any heavy lifting or straining until approved by your doctor. a. You may drive when you are no longer taking prescription pain medication, you can comfortably wear a seatbelt, and you can safely maneuver your car and apply brakes. b. RETURN TO WORK:  __________________________________________________________ 9. You should see your doctor in the office for a follow-up appointment approximately 2-3 weeks after your surgery.  Make sure that you call for this appointment within a day or two after you arrive home to insure a convenient appointment time. 10. OTHER INSTRUCTIONS: ICE PACK AND IBUPROFEN ALSO FOR PAIN 11. NO LIFTING MORE THAN 15 TO 20 POUNDS FOR 4 WEEKS __________________________________________________________________________________________________________________________________________________________________________________________  WHEN TO CALL YOUR DOCTOR: 1. Fever over 101.0 2. Inability to urinate 3. Nausea and/or vomiting 4. Extreme swelling or bruising 5. Continued bleeding from incision. 6. Increased pain, redness, or drainage from the incision  The clinic staff is available to answer your questions during regular business hours.  Please dont hesitate to call and ask to speak to one of the nurses for clinical concerns.  If you have a medical emergency, go to the nearest emergency room or  call 911.  A surgeon from [facility] is always on call at the hospital   [address], [place], [place], [place]  [address] ?  [address],  [place], [place]   [address] [phone] ? [phone] ? [phone] Web site: [email or web address]

## 2013-12-27 NOTE — Op Note (Signed)
NAMEMarland Kitchen  NORVELLA, LOSCALZO NO.:  [id number]  MEDICAL RECORD NO.:  [id number]  LOCATION:  [facility]                         FACILITY:  [facility]  PHYSICIAN:  Coralie Keens, M.D. DATE OF BIRTH:  [birth year]  DATE OF PROCEDURE:  12/27/2013 DATE OF DISCHARGE:  12/27/2013                              OPERATIVE REPORT   PREOPERATIVE DIAGNOSIS:  Umbilical hernia.  POSTOPERATIVE DIAGNOSIS:  Umbilical hernia.  PROCEDURE:  Umbilical hernia repair.  SURGEON:  Coralie Keens, M.D.  ANESTHESIA:  General with 0.5% Marcaine.  ESTIMATED BLOOD LOSS:  Minimal.  PROCEDURE IN DETAIL:  The patient was brought to the operating room, identified as Kendra Stein.  She was placed supine on the operating room table and general anesthesia was induced.  Her abdomen was then prepped and draped in usual sterile fashion.  I anesthetized the skin in the lower edge of umbilicus through a previous scar with Marcaine.  I then made a transverse incision through the scar with a scalpel.  I took this down to the hernia sac which I then separated from the overlying umbilical skin.  There was incarcerated omentum in the sac.  I excised the sac and the omentum in its entirety.  The actual fascial defect itself was less than a centimeter in size.  At this point, I elected not to use mesh.  I closed the small fascial defect with 2 separate figure- of-eight #1 Novafil sutures.  I then anesthetized the fascia further with Marcaine.  I then closed subcutaneous tissue with interrupted 3-0 Vicryl sutures and closed the skin with a running 4-0 Monocryl.  Steri- Strips, gauze, and a bandage were then applied.  The patient tolerated the procedure well.  All the counts were correct at the end of procedure.  The patient was then extubated in the operating room and taken in stable condition to recovery room.     Coralie Keens, M.D.     DB/MEDQ  D:  12/27/2013  T:  12/27/2013  Job:  [id number]

## 2013-12-28 ENCOUNTER — Encounter (HOSPITAL_COMMUNITY): Payer: Self-pay | Admitting: Surgery

## 2014-01-16 ENCOUNTER — Ambulatory Visit: Payer: Self-pay | Admitting: Physician Assistant

## 2014-01-16 ENCOUNTER — Ambulatory Visit (INDEPENDENT_AMBULATORY_CARE_PROVIDER_SITE_OTHER): Payer: BC Managed Care – PPO | Admitting: Surgery

## 2014-01-16 ENCOUNTER — Encounter: Payer: Self-pay | Admitting: Internal Medicine

## 2014-01-16 ENCOUNTER — Encounter (INDEPENDENT_AMBULATORY_CARE_PROVIDER_SITE_OTHER): Payer: Self-pay | Admitting: Surgery

## 2014-01-16 VITALS — BP 121/71 | HR 76 | Temp 98.6°F | Resp 18 | Ht 62.0 in | Wt 222.4 lb

## 2014-01-16 DIAGNOSIS — Z09 Encounter for follow-up examination after completed treatment for conditions other than malignant neoplasm: Secondary | ICD-10-CM

## 2014-01-16 NOTE — Progress Notes (Signed)
Subjective:     Patient ID: Kendra Stein, female   DOB: 1976/03/29, 38 y.o.   MRN: [id number]  HPI She is here for her first postop visit status post umbilical hernia repair with mesh. She is doing very well and has no complaints.   Review of Systems     Objective:   Physical Exam On exam, her incision is well-healed without evidence of infection or recurrence    Assessment:     Patient stable postop     Plan:     She will refrain from heavy lifting for one more week. She may then resume normal activity. I will see her back as needed

## 2014-05-13 ENCOUNTER — Encounter (INDEPENDENT_AMBULATORY_CARE_PROVIDER_SITE_OTHER): Payer: Self-pay | Admitting: Surgery

## 2014-08-01 ENCOUNTER — Ambulatory Visit (INDEPENDENT_AMBULATORY_CARE_PROVIDER_SITE_OTHER): Payer: BC Managed Care – PPO | Admitting: Physician Assistant

## 2014-08-01 VITALS — BP 130/78 | HR 80 | Temp 98.1°F | Resp 16 | Ht 61.5 in | Wt 235.0 lb

## 2014-08-01 DIAGNOSIS — G43909 Migraine, unspecified, not intractable, without status migrainosus: Secondary | ICD-10-CM

## 2014-08-01 DIAGNOSIS — Z79899 Other long term (current) drug therapy: Secondary | ICD-10-CM

## 2014-08-01 DIAGNOSIS — E559 Vitamin D deficiency, unspecified: Secondary | ICD-10-CM

## 2014-08-01 DIAGNOSIS — R7309 Other abnormal glucose: Secondary | ICD-10-CM

## 2014-08-01 DIAGNOSIS — I10 Essential (primary) hypertension: Secondary | ICD-10-CM

## 2014-08-01 DIAGNOSIS — R7303 Prediabetes: Secondary | ICD-10-CM

## 2014-08-01 LAB — CBC WITH DIFFERENTIAL/PLATELET
Basophils Absolute: 0 10*3/uL (ref 0.0–0.1)
Basophils Relative: 0 % (ref 0–1)
EOS PCT: 1 % (ref 0–5)
Eosinophils Absolute: 0.1 10*3/uL (ref 0.0–0.7)
HEMATOCRIT: 35.9 % — AB (ref 36.0–46.0)
Hemoglobin: 12.4 g/dL (ref 12.0–15.0)
Lymphocytes Relative: 25 % (ref 12–46)
Lymphs Abs: 2.1 10*3/uL (ref 0.7–4.0)
MCH: 27.3 pg (ref 26.0–34.0)
MCHC: 34.5 g/dL (ref 30.0–36.0)
MCV: 78.9 fL (ref 78.0–100.0)
MONOS PCT: 8 % (ref 3–12)
MPV: 9 fL (ref 8.6–12.4)
Monocytes Absolute: 0.7 10*3/uL (ref 0.1–1.0)
NEUTROS PCT: 66 % (ref 43–77)
Neutro Abs: 5.4 10*3/uL (ref 1.7–7.7)
PLATELETS: 296 10*3/uL (ref 150–400)
RBC: 4.55 MIL/uL (ref 3.87–5.11)
RDW: 14.3 % (ref 11.5–15.5)
WBC: 8.2 10*3/uL (ref 4.0–10.5)

## 2014-08-01 LAB — HEMOGLOBIN A1C
HEMOGLOBIN A1C: 5.8 % — AB (ref <5.7)
Mean Plasma Glucose: 120 mg/dL — ABNORMAL HIGH (ref <117)

## 2014-08-01 MED ORDER — PHENTERMINE HCL 37.5 MG PO TABS: 37.5000 mg | ORAL_TABLET | Freq: Every day | ORAL | Status: DC

## 2014-08-01 MED ORDER — HYDROCHLOROTHIAZIDE 12.5 MG PO CAPS: 12.5000 mg | ORAL_CAPSULE | Freq: Every day | ORAL | Status: DC

## 2014-08-01 NOTE — Progress Notes (Signed)
Assessment and Plan:  Hypertension: will add low dose HCTA 12.5mg , monitor blood pressure at home. Continue DASH diet.  Reminder to go to the ER if any CP, SOB, nausea, dizziness, severe HA, changes vision/speech, left arm numbness and tingling, and jaw pain. Cholesterol: Continue diet and exercise. Check cholesterol.  Pre-diabetes-Continue diet and exercise. Check A1C Vitamin D Def- check level and continue medications.  Obesity with co morbidities- long discussion about weight loss, diet, and exercise, will do phentermine and follow up closely.  Impingement Syndrome- get cervical neck pillow, given exercises, follow up in the office if not better.   Follow up 1 month Continue diet and meds as discussed. Further disposition pending results of labs.  HPI 39 y.o. female  presents for 3 month follow up with hypertension, hyperlipidemia, prediabetes and vitamin D. Her blood pressure has been controlled at home, today their BP is BP: 130/78 mmHg She does workout. She denies chest pain, shortness of breath, dizziness.  She is not on cholesterol medication and denies myalgias. Her cholesterol is at goal. The cholesterol last visit was:   Lab Results  Component Value Date   CHOL 159 08/14/2013   HDL 41 08/14/2013   LDLCALC 83 08/14/2013   TRIG 173* 08/14/2013   CHOLHDL 3.9 08/14/2013  She has been working on diet and exercise for prediabetes, and denies paresthesia of the feet, polydipsia, polyuria and visual disturbances. She has some numbness/feels funny in her bilateral hands, worse in the morning. Last A1C in the office was:  Lab Results  Component Value Date   HGBA1C 5.8* 08/14/2013  Patient is on Vitamin D supplement.   She had hernia repait with Dr. Ninfa Linden in June that went well.  BMI is Body mass index is 43.69 kg/(m^2)., she is working on diet and exercise, she has been off the phentermine since July and has gained more weight, she has been increasing water, trying to do well. Wt  Readings from Last 3 Encounters:  08/01/14 235 lb (106.595 kg)  01/16/14 222 lb 6.4 oz (100.88 kg)  12/19/13 220 lb (99.791 kg)    Current Medications:  Current Outpatient Prescriptions on File Prior to Visit  Medication Sig Dispense Refill  . fluconazole (DIFLUCAN) 150 MG tablet Take 1 tablet (150 mg total) by mouth daily. 1 tablet 3  . ibuprofen (ADVIL,MOTRIN) 200 MG tablet Take 600 mg by mouth every 6 (six) hours as needed for mild pain.    . Multiple Vitamins-Minerals (MULTIVITAMIN GUMMIES ADULT PO) Take by mouth. DAILY    . phentermine (ADIPEX-P) 37.5 MG tablet Take 37.5 mg by mouth daily before breakfast.     No current facility-administered medications on file prior to visit.   Medical History:  Past Medical History  Diagnosis Date  . Abnormal Pap smear   . Cervical polyp   . Headache(784.0)   . Toxemia in pregnancy   . Depression   . Migraines   . Vitamin D deficiency   . Morbid obesity   . Prediabetes   . Umbilical hernia     SOME ABDOMINAL DISCOMFORT  . UTI (urinary tract infection)     ON MEDICATION AS OF 6/10 FOR UTI - HX OF FREQUENT UTI'S   Allergies: No Known Allergies   Review of Systems:  Review of Systems  Constitutional: Negative.   HENT: Negative.   Respiratory: Negative.   Cardiovascular: Positive for leg swelling. Negative for chest pain, palpitations, orthopnea, claudication and PND.  Gastrointestinal: Positive for abdominal pain (lower AB cramping  occ, no accompaniments, nothing better/worse). Negative for heartburn, nausea, vomiting, diarrhea, constipation, blood in stool and melena.  Genitourinary: Negative.  Negative for dysuria, urgency, frequency, hematuria and flank pain.  Musculoskeletal: Negative.   Skin: Negative.   Neurological: Positive for sensory change (bilateral hand in the morning. ). Negative for dizziness, tingling, tremors, speech change, focal weakness, seizures and loss of consciousness.  Psychiatric/Behavioral: Negative.   Negative for depression and suicidal ideas.   Family history- Review and unchanged Social history- Review and unchanged Physical Exam: BP 130/78 mmHg  Pulse 80  Temp(Src) 98.1 F (36.7 C)  Resp 16  Ht 5' 1.5" (1.562 m)  Wt 235 lb (106.595 kg)  BMI 43.69 kg/m2 Wt Readings from Last 3 Encounters:  08/01/14 235 lb (106.595 kg)  01/16/14 222 lb 6.4 oz (100.88 kg)  12/19/13 220 lb (99.791 kg)   General Appearance: Well nourished, in no apparent distress. Eyes: PERRLA, EOMs, conjunctiva no swelling or erythema Sinuses: No Frontal/maxillary tenderness ENT/Mouth: Ext aud canals clear, TMs without erythema, bulging. No erythema, swelling, or exudate on post pharynx.  Tonsils not swollen or erythematous. Hearing normal.  Neck: Supple, thyroid normal.  Respiratory: Respiratory effort normal, BS equal bilaterally without rales, rhonchi, wheezing or stridor.  Cardio: RRR with no MRGs. Brisk peripheral pulses without edema.  Abdomen: Soft, + BS, obese, Non tender, no guarding, rebound, hernias, masses. Lymphatics: Non tender without lymphadenopathy.  Musculoskeletal: Full ROM, 5/5 strength, normal gait.  Skin: Warm, dry without rashes, lesions, ecchymosis.  Neuro: Cranial nerves intact. Normal muscle tone, no cerebellar symptoms. Sensation intact.  Psych: Awake and oriented X 3, normal affect, Insight and Judgment appropriate.    Vicie Mutters, PA-C 9:57 AM [facility]

## 2014-08-01 NOTE — Patient Instructions (Signed)
Before you even begin to attack a weight-loss plan, it pays to remember this: You are not fat. You have fat. Losing weight isn't about blame or shame; it's simply another achievement to accomplish. Dieting is like any other skill-you have to buckle down and work at it. As long as you act in a smart, reasonable way, you'll ultimately get where you want to be. Here are some weight loss pearls for you.  1. It's Not a Diet. It's a Lifestyle Thinking of a diet as something you're on and suffering through only for the short term doesn't work. To shed weight and keep it off, you need to make permanent changes to the way you eat. It's OK to indulge occasionally, of course, but if you cut calories temporarily and then revert to your old way of eating, you'll gain back the weight quicker than you can say yo-yo. Use it to lose it. Research shows that one of the best predictors of long-term weight loss is how many pounds you drop in the first month. For that reason, nutritionists often suggest being stricter for the first two weeks of your new eating strategy to build momentum. Cut out added sugar and alcohol and avoid unrefined carbs. After that, figure out how you can reincorporate them in a way that's healthy and maintainable.  2. There's a Right Way to Exercise Working out burns calories and fat and boosts your metabolism by building muscle. But those trying to lose weight are notorious for overestimating the number of calories they burn and underestimating the amount they take in. Unfortunately, your system is biologically programmed to hold on to extra pounds and that means when you start exercising, your body senses the deficit and ramps up its hunger signals. If you're not diligent, you'll eat everything you burn and then some. Use it to lose it. Cardio gets all the exercise glory, but strength and interval training are the real heroes. They help you build lean muscle, which in turn increases your metabolism and  calorie-burning ability 3. Don't Overreact to Mild Hunger Some people have a hard time losing weight because of hunger anxiety. To them, being hungry is bad-something to be avoided at all costs-so they carry snacks with them and eat when they don't need to. Others eat because they're stressed out or bored. While you never want to get to the point of being ravenous (that's when bingeing is likely to happen), a hunger pang, a craving, or the fact that it's 3:00 p.m. should not send you racing for the vending machine or obsessing about the energy bar in your purse. Ideally, you should put off eating until your stomach is growling and it's difficult to concentrate.  Use it to lose it. When you feel the urge to eat, use the HALT method. Ask yourself, Am I really hungry? Or am I angry or anxious, lonely or bored, or tired? If you're still not certain, try the apple test. If you're truly hungry, an apple should seem delicious; if it doesn't, something else is going on. Or you can try drinking water and making yourself busy, if you are still hungry try a healthy snack.  4. Not All Calories Are Created Equal The mechanics of weight loss are pretty simple: Take in fewer calories than you use for energy. But the kind of food you eat makes all the difference. Processed food that's high in saturated fat and refined starch or sugar can cause inflammation that disrupts the hormone signals that tell   your brain you're full. The result: You eat a lot more.  Use it to lose it. Clean up your diet. Swap in whole, unprocessed foods, including vegetables, lean protein, and healthy fats that will fill you up and give you the biggest nutritional bang for your calorie buck. In a few weeks, as your brain starts receiving regular hunger and fullness signals once again, you'll notice that you feel less hungry overall and naturally start cutting back on the amount you eat.  5. Protein, Produce, and Plant-Based Fats Are Your Weight-Loss  [place] Here's why eating the three Ps regularly will help you drop pounds. Protein fills you up. You need it to build lean muscle, which keeps your metabolism humming so that you can torch more fat. People in a weight-loss program who ate double the recommended daily allowance for protein (about 110 grams for a 150-pound woman) lost 70 percent of their weight from fat, while people who ate the RDA lost only about 40 percent, one study found. Produce is packed with filling fiber. "It's very difficult to consume too many calories if you're eating a lot of vegetables. Example: Three cups of broccoli is a lot of food, yet only 93 calories. (Fruit is another story. It can be easy to overeat and can contain a lot of calories from sugar, so be sure to monitor your intake.) Plant-based fats like olive oil and those in avocados and nuts are healthy and extra satiating.  Use it to lose it. Aim to incorporate each of the three Ps into every meal and snack. People who eat protein throughout the day are able to keep weight off, according to a study in the American Journal of Clinical Nutrition. In addition to meat, poultry and seafood, good sources are beans, lentils, eggs, tofu, and yogurt. As for fat, keep portion sizes in check by measuring out salad dressing, oil, and nut butters (shoot for one to two tablespoons). Finally, eat veggies or a little fruit at every meal. People who did that consumed 308 fewer calories but didn't feel any hungrier than when they didn't eat more produce.  7. How You Eat Is As Important As What You Eat In order for your brain to register that you're full, you need to focus on what you're eating. Sit down whenever you eat, preferably at a table. Turn off the TV or computer, put down your phone, and look at your food. Smell it. Chew slowly, and don't put another bite on your fork until you swallow. When women ate lunch this attentively, they consumed 30 percent less when snacking later than  those who listened to an audiobook at lunchtime, according to a study in the [employer] Journal of Nutrition. 8. Weighing Yourself Really Works The scale provides the best evidence about whether your efforts are paying off. Seeing the numbers tick up or down or stagnate is motivation to keep going-or to rethink your approach. A 2015 study at [employer] found that daily weigh-ins helped people lose more weight, keep it off, and maintain that loss, even after two years. Use it to lose it. Step on the scale at the same time every day for the best results. If your weight shoots up several pounds from one weigh-in to the next, don't freak out. Eating a lot of salt the night before or having your period is the likely culprit. The number should return to normal in a day or two. It's a steady climb that you need to do something about.   9. Too Much Stress and Too Little Sleep Are Your Enemies When you're tired and frazzled, your body cranks up the production of cortisol, the stress hormone that can cause carb cravings. Not getting enough sleep also boosts your levels of ghrelin, a hormone associated with hunger, while suppressing leptin, a hormone that signals fullness and satiety. People on a diet who slept only five and a half hours a night for two weeks lost 55 percent less fat and were hungrier than those who slept eight and a half hours, according to a study in the [employer] Journal. Use it to lose it. Prioritize sleep, aiming for seven hours or more a night, which research shows helps lower stress. And make sure you're getting quality zzz's. If a snoring spouse or a fidgety cat wakes you up frequently throughout the night, you may end up getting the equivalent of just four hours of sleep, according to a study from [employer]. Keep pets out of the bedroom, and use a white-noise app to drown out snoring. 10. You Will Hit a plateau-And You Can Bust Through It As you slim down, your  body releases much less leptin, the fullness hormone.  If you're not strength training, start right now. Building muscle can raise your metabolism to help you overcome a plateau. To keep your body challenged and burning calories, incorporate new moves and more intense intervals into your workouts or add another sweat session to your weekly routine. Alternatively, cut an extra 100 calories or so a day from your diet. Now that you've lost weight, your body simply doesn't need as much fuel.   Ways to cut 100 calories  1. Eat your eggs with hot sauce OR salsa instead of cheese.  Eggs are great for breakfast, but many people consider eggs and cheese to be BFFs. Instead of cheese-1 oz. of cheddar has 114 calories-top your eggs with hot sauce, which contains no calories and helps with satiety and metabolism. Salsa is also a great option!!  2. Top your toast, waffles or pancakes with mashed berries instead of jelly or syrup. Half a cup of berries-fresh, frozen or thawed-has about 40 calories, compared with 2 tbsp. of maple syrup or jelly, which both have about 100 calories. The berries will also give you a good punch of fiber, which helps keep you full and satisfied and won't spike blood sugar quickly like the jelly or syrup. 3. Swap the non-fat latte for black coffee with a splash of half-and-half. Contrary to its name, that non-fat latte has 130 calories and a startling 19g of carbohydrates per 16 oz. serving. Replacing that 'light' drinkable dessert with a black coffee with a splash of half-and-half saves you more than 100 calories per 16 oz. serving. 4. Sprinkle salads with freeze-dried raspberries instead of dried cranberries. If you want a sweet addition to your nutritious salad, stay away from dried cranberries. They have a whopping 130 calories per  cup and 30g carbohydrates. Instead, sprinkle freeze-dried raspberries guilt-free and save more than 100 calories per  cup serving, adding 3g of belly-filling  fiber. 5. Go for mustard in place of mayo on your sandwich. Mustard can add really nice flavor to any sandwich, and there are tons of varieties, from spicy to honey. A serving of mayo is 95 calories, versus 10 calories in a serving of mustard. 6. Choose a DIY salad dressing instead of the store-bought kind. Mix Dijon or whole grain mustard with low-fat Kefir or red wine vinegar   and garlic. 7. Use hummus as a spread instead of a dip. Use hummus as a spread on a high-fiber cracker or tortilla with a sandwich and save on calories without sacrificing taste. 8. Pick just one salad "accessory." Salad isn't automatically a calorie winner. It's easy to over-accessorize with toppings. Instead of topping your salad with nuts, avocado and cranberries (all three will clock in at 313 calories), just pick one. The next day, choose a different accessory, which will also keep your salad interesting. You don't wear all your jewelry every day, right? 9. Ditch the white pasta in favor of spaghetti squash. One cup of cooked spaghetti squash has about 40 calories, compared with traditional spaghetti, which comes with more than 200. Spaghetti squash is also nutrient-dense. It's a good source of fiber and Vitamins A and C, and it can be eaten just like you would eat pasta-with a great tomato sauce and turkey meatballs or with pesto, tofu and spinach, for example. 10. Dress up your chili, soups and stews with non-fat Greek yogurt instead of sour cream. Just a 'dollop' of sour cream can set you back 115 calories and a whopping 12g of fat-seven of which are of the artery-clogging variety. Added bonus: Greek yogurt is packed with muscle-building protein, calcium and B Vitamins. 11. Mash cauliflower instead of mashed potatoes. One cup of traditional mashed potatoes-in all their creamy goodness-has more than 200 calories, compared to mashed cauliflower, which you can typically eat for less than 100 calories per 1 cup serving.  Cauliflower is a great source of the antioxidant indole-3-carbinol (I3C), which may help reduce the risk of some cancers, like breast cancer. 12. Ditch the ice cream sundae in favor of a Greek yogurt parfait. Instead of a cup of ice cream or fro-yo for dessert, try 1 cup of nonfat Greek yogurt topped with fresh berries and a sprinkle of cacao nibs. Both toppings are packed with antioxidants, which can help reduce cellular inflammation and oxidative damage. And the comparison is a no-brainer: One cup of ice cream has about 275 calories; one cup of frozen yogurt has about 230; and a cup of Greek yogurt has just 130, plus twice the protein, so you're less likely to return to the freezer for a second helping. 13. Put olive oil in a spray container instead of using it directly from the bottle. Each tablespoon of olive oil is 120 calories and 15g of fat. Use a mister instead of pouring it straight into the pan or onto a salad. This allows for portion control and will save you more than 100 calories. 14. When baking, substitute canned pumpkin for butter or oil. Canned pumpkin-not pumpkin pie mix-is loaded with Vitamin A, which is important for skin and eye health, as well as immunity. And the comparisons are pretty crazy:  cup of canned pumpkin has about 40 calories, compared to butter or oil, which has more than 800 calories. Yes, 800 calories. Applesauce and mashed banana can also serve as good substitutions for butter or oil, usually in a 1:1 ratio. 15. Top casseroles with high-fiber cereal instead of breadcrumbs. Breadcrumbs are typically made with white bread, while breakfast cereals contain 5-9g of fiber per serving. Not only will you save more than 150 calories per  cup serving, the swap will also keep you more full and you'll get a metabolism boost from the added fiber. 16. Snack on pistachios instead of macadamia nuts. Believe it or not, you get the same amount of calories from 35   pistachios (100  calories) as you would from only five macadamia nuts. 17. Chow down on kale chips rather than potato chips. This is my favorite 'don't knock it 'till you try it' swap. Kale chips are so easy to make at home, and you can spice them up with a little grated parmesan or chili powder. Plus, they're a mere fraction of the calories of potato chips, but with the same crunch factor we crave so often. 18. Add seltzer and some fruit slices to your cocktail instead of soda or fruit juice. One cup of soda or fruit juice can pack on as much as 140 calories. Instead, use seltzer and fruit slices. The fruit provides valuable phytochemicals, such as flavonoids and anthocyanins, which help to combat cancer and stave off the aging process.  

## 2014-08-02 LAB — BASIC METABOLIC PANEL WITH GFR
BUN: 8 mg/dL (ref 6–23)
CO2: 24 mEq/L (ref 19–32)
Calcium: 8.9 mg/dL (ref 8.4–10.5)
Chloride: 105 mEq/L (ref 96–112)
Creat: 0.8 mg/dL (ref 0.50–1.10)
GFR, Est Non African American: >89 mL/min
Glucose, Bld: 93 mg/dL (ref 70–99)
Potassium: 4.1 mEq/L (ref 3.5–5.3)
Sodium: 138 mEq/L (ref 135–145)

## 2014-08-02 LAB — HEPATIC FUNCTION PANEL
ALT: 19 U/L (ref 0–35)
AST: 18 U/L (ref 0–37)
Albumin: 3.9 g/dL (ref 3.5–5.2)
Alkaline Phosphatase: 68 U/L (ref 39–117)
BILIRUBIN TOTAL: 0.4 mg/dL (ref 0.2–1.2)
Bilirubin, Direct: 0.1 mg/dL (ref 0.0–0.3)
Indirect Bilirubin: 0.3 mg/dL (ref 0.2–1.2)
Total Protein: 6.7 g/dL (ref 6.0–8.3)

## 2014-08-02 LAB — VITAMIN D 25 HYDROXY (VIT D DEFICIENCY, FRACTURES): Vit D, 25-Hydroxy: 11 ng/mL — ABNORMAL LOW (ref 30–100)

## 2014-08-02 LAB — LIPID PANEL
CHOLESTEROL: 176 mg/dL (ref 0–200)
HDL: 43 mg/dL (ref >39)
LDL CALC: 113 mg/dL — AB (ref 0–99)
Total CHOL/HDL Ratio: 4.1 Ratio
Triglycerides: 99 mg/dL (ref <150)
VLDL: 20 mg/dL (ref 0–40)

## 2014-08-02 LAB — TSH: TSH: 0.928 u[IU]/mL (ref 0.350–4.500)

## 2014-08-02 LAB — MAGNESIUM: Magnesium: 2.1 mg/dL (ref 1.5–2.5)

## 2014-08-02 LAB — INSULIN, FASTING: Insulin fasting, serum: 36.6 u[IU]/mL — ABNORMAL HIGH (ref 2.0–19.6)

## 2014-09-09 ENCOUNTER — Ambulatory Visit (INDEPENDENT_AMBULATORY_CARE_PROVIDER_SITE_OTHER): Payer: BC Managed Care – PPO | Admitting: Physician Assistant

## 2014-09-09 ENCOUNTER — Encounter: Payer: Self-pay | Admitting: Physician Assistant

## 2014-09-09 VITALS — BP 122/82 | HR 72 | Temp 98.1°F | Resp 16 | Ht 61.5 in | Wt 229.0 lb

## 2014-09-09 DIAGNOSIS — I10 Essential (primary) hypertension: Secondary | ICD-10-CM

## 2014-09-09 DIAGNOSIS — M25562 Pain in left knee: Secondary | ICD-10-CM

## 2014-09-09 DIAGNOSIS — R7303 Prediabetes: Secondary | ICD-10-CM

## 2014-09-09 DIAGNOSIS — R7309 Other abnormal glucose: Secondary | ICD-10-CM

## 2014-09-09 DIAGNOSIS — Z79899 Other long term (current) drug therapy: Secondary | ICD-10-CM

## 2014-09-09 DIAGNOSIS — M25561 Pain in right knee: Secondary | ICD-10-CM | POA: Insufficient documentation

## 2014-09-09 LAB — BASIC METABOLIC PANEL WITH GFR
BUN: 9 mg/dL (ref 6–23)
CALCIUM: 8.8 mg/dL (ref 8.4–10.5)
CO2: 26 meq/L (ref 19–32)
Chloride: 102 mEq/L (ref 96–112)
Creat: 0.79 mg/dL (ref 0.50–1.10)
GFR, Est African American: >89 mL/min
GFR, Est Non African American: >89 mL/min
GLUCOSE: 91 mg/dL (ref 70–99)
POTASSIUM: 3.9 meq/L (ref 3.5–5.3)
Sodium: 138 mEq/L (ref 135–145)

## 2014-09-09 LAB — MAGNESIUM: Magnesium: 2.1 mg/dL (ref 1.5–2.5)

## 2014-09-09 MED ORDER — PHENTERMINE HCL 37.5 MG PO TABS: 37.5000 mg | ORAL_TABLET | Freq: Every day | ORAL | Status: DC

## 2014-09-09 NOTE — Patient Instructions (Addendum)
Benefiber is good for constipation/diarrhea/irritable bowel syndrome, it helps with weight loss and can help lower your bad cholesterol. Please do 1-2 TBSP in the morning in water, coffee, or tea. It can take up to a month before you can see a difference with your bowel movements. It is cheapest from [employer], [employer], [employer].   We want weight loss that will last so you should lose 1-2 pounds a week.  THAT IS IT! Please pick THREE things a month to change. Once it is a habit check off the item. Then pick another three items off the list to become habits.  If you are already doing a habit on the list GREAT!  Cross that item off! o Don't drink your calories. Ie, alcohol, soda, fruit juice, and sweet tea.  o Drink more water. Drink a glass when you feel hungry or before each meal.  o Eat breakfast - Complex carb and protein (likeDannon light and fit yogurt, oatmeal, fruit, eggs, [place] bacon). o Measure your cereal.  Eat no more than one cup a day. (ie [place]) o Eat an apple a day. o Add a vegetable a day. o Try a new vegetable a month. o Use Pam! Stop using oil or butter to cook. o Don't finish your plate or use smaller plates. o Share your dessert. o Eat sugar free Jello for dessert or frozen grapes. o Don't eat 2-3 hours before bed. o Switch to whole wheat bread, pasta, and brown rice. o Make healthier choices when you eat out. No fries! o Pick baked chicken, NOT fried. o Don't forget to SLOW DOWN when you eat. It is not going anywhere.  o Take the stairs. o Park far away in the parking lot o [employer] (or weights) for 10 minutes while watching TV. o Walk at work for 10 minutes during break. o Walk outside 1 time a week with your friend, kids, dog, or significant other. o Start a walking group at [place] the mall as much as you can tolerate.  o Keep a food diary. o Weigh yourself daily. o Walk for 15 minutes 3 days per week. o Cook at home more often and eat out less.  If life  happens and you go back to old habits, it is okay.  Just start over. You can do it!   If you experience chest pain, get short of breath, or tired during the exercise, please stop immediately and inform your doctor.  Excessive Lateral Patellar Compression Syndrome with Rehab Excessive lateral patellar compression syndrome involves an increase in pressure in the knee joint, that causes pain. The kneecap (patella) is a v-shaped bone that usually glides through the center of the groove in the thigh bone (trochlea). For people with this condition, the kneecap presses more on the outer (lateral) side of the groove, causing an increase in pressure and pain in the knee. This condition usually occurs without injury, although it may also follow an injury. SYMPTOMS   General, spread out knee pain, most commonly in the front half of the knee, behind the kneecap, or in the very back of the knee. Less commonly, the pain may be above or below the kneecap.  Pain that gets worse with sitting for long periods, rising from a sitting position, going up or down stairs or hills, kneeling, squatting, or wearing shoes with heels.  Often, pain with jumping.  Often, an aching pain.  Giving way, catching of the knee.  Minimal or no swelling,  no locking. CAUSES  Excessive lateral patellar compression syndrome is caused by weakness in the thigh (quadriceps) muscles, that causes the kneecap to track poorly within the knee joint. The poor tracking of the kneecap may also occur in people who have an improper alignment of the knee and leg (bowlegged, knock knees). The poor tracking of the kneecap results in an increase in pressure on the outer side of the knee. The membrane that holds the kneecap in place (retinaculum) is stretched, which causes pain. The pain gets worse with use of the thigh (quadriceps) muscle. RISK INCREASES WITH:  Tight back of the thigh (hamstring), front of the thigh (quadriceps), or calf  muscles.  Weak front of the thigh (quadriceps) muscles.  Failure to warm up properly before activity.  Sports that involve running, jumping, or squatting.  Poor alignment of the legs (bowlegged, knock knees).  Born with (congenital) malformation of the kneecap or thigh bone groove (trochlea).  Previous injury or surgery to the knee.  Direct injury to the kneecap (falling on the kneecap). PREVENTION   Warm up and stretch properly before activity.  Maintain physical fitness:  Strength, flexibility, and endurance.  Cardiovascular fitness.  Use arch supports (orthotics) or knee pads. PROGNOSIS  If treated properly, excessive lateral patellar compression syndrome is usually curable. If left untreated, the condition does not usually result in a permanent condition. RELATED COMPLICATIONS   Frequently recurring symptoms and disability, severe enough to decrease an athlete's competitive ability.  Arthritis of the kneecap.  Kneecap dislocations.  Risks of surgery: infection, bleeding, injury to nerves (numbness, weakness, paralysis), knee stiffness, dislocation of the kneecap, weakness, continued pain, compartment syndrome (if surgery is performed to cut the bone of the leg and move it). TREATMENT  Treatment first involves ice and medicine, to reduce pain and inflammation. Since weak muscles often cause the condition, it is important to complete strengthening and stretching exercises to help the kneecap track properly in the thigh bone groove. These exercises may be performed at home or with a therapist. Your caregiver may recommend that you wear a knee brace, to help the kneecap track properly. For people with flat feet, orthotics may be advised. If non-surgical treatment is unsuccessful, surgery may be needed. Surgery involves cutting the outer side (lateral release) of the connecting band (retinaculum), with or without tightening the retinaculum on the inner side of the knee.  Sometimes, surgery to cut the bony bump below the kneecap (tibial tubercle) and move it may be required (insertion of the patellar tendon into bone).  MEDICATION  If pain medicine is needed, nonsteroidal anti-inflammatory medicines (aspirin and ibuprofen), or other minor pain relievers (acetaminophen), are often advised.  Do not take pain medicine for 7 days before surgery.  Prescription pain relievers may be given, if your caregiver thinks they are needed. Use only as directed and only as much as you need.  Corticosteroid injections are rarely used for this condition. HEAT AND COLD  Cold treatment (icing) should be applied for 10 to 15 minutes every 2 to 3 hours for inflammation and pain, and immediately after activity that aggravates your symptoms. Use ice packs or an ice massage.  Heat treatment may be used before performing stretching and strengthening activities prescribed by your caregiver, physical therapist, or athletic trainer. Use a heat pack or a warm water soak. SEEK MEDICAL CARE IF:  Symptoms get worse or do not improve in 6 to 8 weeks, despite treatment.  Any of the following occur after surgery:  Pain, numbness, coldness, or discoloration (blue, gray, or dark color) in the foot.  Fever, increased pain, swelling, redness, drainage of fluids, or bleeding in the affected area.  New, unexplained symptoms develop. (Drugs used in treatment may produce side effects.) EXERCISES  RANGE OF MOTION (ROM) AND STRETCHING EXERCISES - Excessive Lateral Patellar Compression Syndrome These exercises may help you when beginning to rehabilitate your injury. Your symptoms may resolve with or without further involvement from your physician, physical therapist or athletic trainer. While completing these exercises, remember:   Restoring tissue flexibility helps normal motion to return to the joints. This allows healthier, less painful movement and activity.  An effective stretch should be held  for at least 30 seconds.  A stretch should never be painful. You should only feel a gentle lengthening or release in the stretched tissue. Inform your caregiver of any exercises that increase your knee discomfort. STRETCH - Lateral Patellar Mobilizations, Seated  While sitting, bend your knee 90 degrees or a little less. Place your foot flat on the floor.  Place the inside of your palm at the base of your thumb, on the inside border of your kneecap.  Press down on the inside border so that the outside border slightly lifts up.  You should feel a slight stretch on the outside edge of your kneecap. Hold this position for __________ seconds. Repeat __________ times. Complete this stretch __________ times per day.  STRETCH - Hamstrings, Standing  Stand or sit and extend your right / left leg, placing your foot on a chair or foot stool.  Keep a slight arch in your low back and your hips straight forward.  Lead with your chest and lean forward at the waist until you feel a gentle stretch in the back of your right / left knee or thigh. (When done correctly, this exercise requires leaning only a small distance.)  Hold this position for __________ seconds. Repeat __________ times. Complete this stretch __________ times per day. STRETCH - Quadriceps, Prone  Lie on your stomach on a firm surface, such as a bed or padded floor.  Bend your right / left knee and grasp your ankle. If you are unable to reach your ankle or pant leg, use a belt around your foot to lengthen your reach.  Gently pull your heel toward your buttocks. Your knee should not slide out to the side. You should feel a stretch in the front of your thigh and knee.  Hold this position for __________ seconds. Repeat __________ times. Complete this stretch __________ times per day.  STRETCH - Iliotibial Band  On the floor or bed, lie on your side so your right / left leg is on top. Bend your knee and grab your ankle.  Slowly bring  your knee back so that your thigh is in line with your trunk. Keep your heel at your buttocks and gently arch your back so your head, shoulders and hips line up.  Slowly lower your leg so that your knee approaches the floor, until you feel a gentle stretch on the outside of your right / left thigh. If you do not feel a stretch and your knee will not fall farther, place the heel of your opposite foot on top of your knee and pull your thigh down farther.  Hold this stretch for __________ seconds. Repeat __________ times. Complete this stretch __________ times per day. STRENGTHENING EXERCISES - Excessive Lateral Patellar Compression Syndrome These exercises may help you when beginning to rehabilitate your injury.  They may resolve your symptoms with or without further involvement from your physician, physical therapist or athletic trainer. While completing these exercises, remember:   Muscles can gain both the endurance and the strength needed for everyday activities through controlled exercises.  Complete these exercises as instructed by your physician, physical therapist or athletic trainer. Increase the resistance and repetitions only as guided.  Only do your exercises in a pain-free range of motion. If the exercises that involve bending your knees while bearing weight cause pain, stop them and consult your caregiver. STRENGTH - Quadriceps, Isometrics  Lie on your back with your right / left leg extended and your opposite knee bent.  Gradually tense the muscles in the front of your right / left thigh. You should see either your knee cap slide up toward your hip or increased dimpling just above the knee. This motion will push the back of the knee down toward the floor, mat, or bed on which you are lying.  Hold the muscle as tight as you can, without increasing your pain, for __________ seconds.  Relax the muscles slowly and completely between each repetition. Repeat __________ times. Complete  this exercise __________ times per day.  STRENGTH - Quadriceps, Short Arcs  Lie on your back. Place a __________ inch towel roll under your right / left knee, so that the knee bends slightly.  Raise only your lower leg, by tightening the muscles in the front of your thigh. Do not allow your thigh to rise.  Hold this position for __________ seconds. Repeat __________ times. Complete this exercise __________ times per day.  OPTIONAL ANKLE WEIGHTS: Begin with ____________________, but DO NOT exceed ____________________. Increase in 1 pound/0.5 kilogram increments. STRENGTH - Quadriceps, Straight Leg Raises Quality counts! Watch for signs that the quadriceps muscle is working, to be sure you are strengthening the correct muscles and not "cheating" by substituting with healthier muscles.  Lay on your back with your right / left leg extended and your opposite knee bent.  Tense the muscles in the front of your right / left thigh. You should see either your knee cap slide up or increased dimpling just above the knee. Your thigh may even shake a bit.  Tighten these muscles even more and raise your leg 4 to 6 inches off the floor. Hold for __________ seconds.  Keeping these muscles tense, lower your leg.  Relax the muscles slowly and completely in between each repetition. Repeat __________ times. Complete this exercise __________ times per day.  STRENGTH - Quadriceps, Wall Slides Follow guidelines for form closely. Increased knee pain often results from poorly placed feet or knees.  Lean against a smooth wall or door, and walk your feet out 18-24 inches. Place your feet hip width apart.  Slowly slide down the wall or door until your knees bend __________ degrees.* Keep your knees over your heels, not your toes, and in line with your hips, not falling to either side.  Hold for __________ seconds. Stand up to rest for __________ seconds between each repetition. Repeat __________ times. Complete  this exercise __________ times per day. * Your physician, physical therapist or athletic trainer will alter this angle based on your symptoms and progress. STRENGTH - Quadriceps, Squats  Stand in a door frame, so that your feet and knees are in line with the frame.  Use your hands for balance, not support, on the frame.  Slowly lower your weight, bending at the hips and knees. Keep your lower legs upright so  that they are parallel with the door frame. Squat only within the range that does not increase your knee pain. Never let your hips drop below your knees.  Slowly return upright, pushing with your legs, not pulling with your hands. Repeat __________ times. Complete this exercise __________ times per day.  STRENGTH - Quadriceps, Step-Ups   Use a thick book, step or step stool that is __________ inches tall.  Hold a wall or counter for balance only, not support.  Slowly, step up with your right / left foot, keeping your knee in line with your hip and foot. Do not allow your knee to bend so far that you cannot see your toes.  Slowly unlock your knee and lower yourself to the starting position. Your muscles, not gravity, should lower you. Repeat __________ times. Complete this exercise __________ times per day.  STRENGTH - Quadriceps, Step-Downs  Stand on the edge of a step stool or stair. Be prepared to use a countertop or wall for balance, if needed.  Keeping your right / left knee directly over the middle of your foot, slowly touch your opposite heel to the floor or lower step. Do not go all the way to the floor if your knee pain increases. Just go as far as you can without increased discomfort. Use your right / left leg muscles, not gravity, to lower your body weight.  Slowly push your body weight back up to the starting position, Repeat __________ times. Complete this exercise __________ times per day.  STRENGTH - Quad/VMO, Isometric  Sit in a chair with your right / left knee  slightly bent. With your fingertips, feel the VMO muscle, just above the inside of your knee. The VMO is important in controlling the position of your kneecap.  Keep your fingertips on this muscle. Without actually moving your leg, attempt to drive your knee down, as if straightening your leg. You should feel your VMO tense. If you have a difficult time, you may wish to try the same exercise on your healthy knee first.  Tense this muscle as hard as you can without increasing any knee pain.  Hold for __________ seconds. Relax the muscles slowly and completely between each repetition. Repeat __________ times. Complete exercise __________ times per day.  Document Released: 06/28/2005 Document Revised: 09/20/2011 Document Reviewed: 10/10/2008 [facility] Patient Information 2015 [place], [place]. This information is not intended to replace advice given to you by your health care provider. Make sure you discuss any questions you have with your health care provider.

## 2014-09-09 NOTE — Progress Notes (Signed)
39 y.o.female presents for a follow up after being on phentermine for weight loss for 1 months. Patient states they have improved meal pattern, less frequent dining out, increased physical activity and better food choices. While on the phentermine they have lost 6 lbs since last visit. They deny palpitations, anxiety, trouble sleeping, elevated BP.  She was also put on HCTZ 12.5mg  for HTN and BP has been running well at home. BP: 122/82 mmHg  Has left knee pain laterally, limp and right knee pain at patella.   Wt Readings from Last 3 Encounters:  09/09/14 229 lb (103.874 kg)  08/01/14 235 lb (106.595 kg)  01/16/14 222 lb 6.4 oz (100.88 kg)   Typical breakfast: fruit/egg/toast/oatmeal/shakes Typical lunch: Salad, grilled nuggets from chick [employer] Typical dinner: chicken/turkey meat, vegggies Snacks: apples, fruit cups, lots of water  Medications: Current Outpatient Prescriptions on File Prior to Visit  Medication Sig Dispense Refill  . fluconazole (DIFLUCAN) 150 MG tablet Take 1 tablet (150 mg total) by mouth daily. 1 tablet 3  . hydrochlorothiazide (MICROZIDE) 12.5 MG capsule Take 1 capsule (12.5 mg total) by mouth daily. 30 capsule 3  . ibuprofen (ADVIL,MOTRIN) 200 MG tablet Take 600 mg by mouth every 6 (six) hours as needed for mild pain.    . Multiple Vitamins-Minerals (MULTIVITAMIN GUMMIES ADULT PO) Take by mouth. DAILY    . phentermine (ADIPEX-P) 37.5 MG tablet Take 1 tablet (37.5 mg total) by mouth daily before breakfast. 30 tablet 0   No current facility-administered medications on file prior to visit.    ROS: All negative except for above  Physical exam: Filed Vitals:   09/09/14 0944  BP: 122/82  Pulse: 72  Temp: 98.1 F (36.7 C)  Resp: 16   BP 122/82 mmHg  Pulse 72  Temp(Src) 98.1 F (36.7 C)  Resp 16  Ht 5' 1.5" (1.562 m)  Wt 229 lb (103.874 kg)  BMI 42.57 kg/m2 General appearance: alert and moderately obese Lungs: clear to auscultation bilaterally Heart:  regular rate and rhythm, S1, S2 normal, no murmur, click, rub or gallop Abdomen: soft, non-tender; bowel sounds normal; no masses,  no organomegaly Extremities: extremities normal, atraumatic, no cyanosis or edema  Assessment: Obesity with co morbid conditions.   Plan: General weight loss/lifestyle modification strategies discussed (elicit support from others; identify saboteurs; non-food rewards, etc). Medication: phentermine. Follow up in: 2 months and as needed.

## 2014-11-11 ENCOUNTER — Ambulatory Visit: Payer: Self-pay | Admitting: Physician Assistant

## 2014-12-02 ENCOUNTER — Ambulatory Visit (INDEPENDENT_AMBULATORY_CARE_PROVIDER_SITE_OTHER): Payer: BC Managed Care – PPO | Admitting: Physician Assistant

## 2014-12-02 ENCOUNTER — Encounter: Payer: Self-pay | Admitting: Physician Assistant

## 2014-12-02 VITALS — BP 110/72 | HR 88 | Temp 97.7°F | Resp 16 | Ht 61.5 in | Wt 229.0 lb

## 2014-12-02 DIAGNOSIS — E559 Vitamin D deficiency, unspecified: Secondary | ICD-10-CM

## 2014-12-02 DIAGNOSIS — I10 Essential (primary) hypertension: Secondary | ICD-10-CM

## 2014-12-02 DIAGNOSIS — R7303 Prediabetes: Secondary | ICD-10-CM

## 2014-12-02 DIAGNOSIS — Z79899 Other long term (current) drug therapy: Secondary | ICD-10-CM

## 2014-12-02 DIAGNOSIS — R7309 Other abnormal glucose: Secondary | ICD-10-CM

## 2014-12-02 LAB — MAGNESIUM: MAGNESIUM: 2 mg/dL (ref 1.5–2.5)

## 2014-12-02 LAB — BASIC METABOLIC PANEL WITH GFR
BUN: 11 mg/dL (ref 6–23)
CO2: 27 mEq/L (ref 19–32)
Calcium: 9.1 mg/dL (ref 8.4–10.5)
Chloride: 104 mEq/L (ref 96–112)
Creat: 0.74 mg/dL (ref 0.50–1.10)
GFR, Est African American: >89 mL/min
GLUCOSE: 90 mg/dL (ref 70–99)
Potassium: 4.1 mEq/L (ref 3.5–5.3)
Sodium: 138 mEq/L (ref 135–145)

## 2014-12-02 LAB — CBC WITH DIFFERENTIAL/PLATELET
Basophils Absolute: 0 10*3/uL (ref 0.0–0.1)
Basophils Relative: 0 % (ref 0–1)
EOS ABS: 0.1 10*3/uL (ref 0.0–0.7)
EOS PCT: 1 % (ref 0–5)
HEMATOCRIT: 37.3 % (ref 36.0–46.0)
Hemoglobin: 12.5 g/dL (ref 12.0–15.0)
Lymphocytes Relative: 25 % (ref 12–46)
Lymphs Abs: 2.2 10*3/uL (ref 0.7–4.0)
MCH: 26.4 pg (ref 26.0–34.0)
MCHC: 33.5 g/dL (ref 30.0–36.0)
MCV: 78.7 fL (ref 78.0–100.0)
MPV: 8.9 fL (ref 8.6–12.4)
Monocytes Absolute: 0.5 10*3/uL (ref 0.1–1.0)
Monocytes Relative: 6 % (ref 3–12)
NEUTROS ABS: 5.8 10*3/uL (ref 1.7–7.7)
NEUTROS PCT: 68 % (ref 43–77)
PLATELETS: 295 10*3/uL (ref 150–400)
RBC: 4.74 MIL/uL (ref 3.87–5.11)
RDW: 14.1 % (ref 11.5–15.5)
WBC: 8.6 10*3/uL (ref 4.0–10.5)

## 2014-12-02 LAB — LIPID PANEL
CHOLESTEROL: 176 mg/dL (ref 0–200)
HDL: 46 mg/dL (ref >=46)
LDL CALC: 103 mg/dL — AB (ref 0–99)
Total CHOL/HDL Ratio: 3.8 Ratio
Triglycerides: 134 mg/dL (ref <150)
VLDL: 27 mg/dL (ref 0–40)

## 2014-12-02 LAB — TSH: TSH: 1.545 u[IU]/mL (ref 0.350–4.500)

## 2014-12-02 LAB — HEPATIC FUNCTION PANEL
ALBUMIN: 3.7 g/dL (ref 3.5–5.2)
ALT: 17 U/L (ref 0–35)
AST: 17 U/L (ref 0–37)
Alkaline Phosphatase: 79 U/L (ref 39–117)
Bilirubin, Direct: 0.1 mg/dL (ref 0.0–0.3)
Indirect Bilirubin: 0.2 mg/dL (ref 0.2–1.2)
Total Bilirubin: 0.3 mg/dL (ref 0.2–1.2)
Total Protein: 7.2 g/dL (ref 6.0–8.3)

## 2014-12-02 LAB — HEMOGLOBIN A1C
HEMOGLOBIN A1C: 5.9 % — AB (ref <5.7)
Mean Plasma Glucose: 123 mg/dL — ABNORMAL HIGH (ref <117)

## 2014-12-02 MED ORDER — PHENTERMINE HCL 37.5 MG PO TABS: 37.5000 mg | ORAL_TABLET | Freq: Every day | ORAL | Status: DC

## 2014-12-02 MED ORDER — SUMATRIPTAN SUCCINATE 100 MG PO TABS: 100.0000 mg | ORAL_TABLET | Freq: Once | ORAL | Status: DC | PRN

## 2014-12-02 NOTE — Progress Notes (Signed)
Assessment and Plan:  1. Hypertension -Continue medication, monitor blood pressure at home. Continue DASH diet.  Reminder to go to the ER if any CP, SOB, nausea, dizziness, severe HA, changes vision/speech, left arm numbness and tingling and jaw pain.  2. Cholesterol -Continue diet and exercise. Check cholesterol.   3. Prediabetes  -Continue diet and exercise. Check A1C  4. Vitamin D Def - check level and continue medications.   5. Obesity with co morbidities  long discussion about weight loss, diet, and exercise   Continue diet and meds as discussed. Further disposition pending results of labs. Over 30 minutes of exam, counseling, chart review, and critical decision making was performed  HPI 39 y.o. female  presents for 3 month follow up on hypertension, cholesterol, prediabetes, and vitamin D deficiency.   Her blood pressure has been controlled at home, today their BP is BP: 110/72 mmHg  She does workout but has not been working out as much for 2 weeks due to being busy/vacation went to the beach. She denies chest pain, shortness of breath, dizziness.  She is not on cholesterol medication and denies myalgias. Her cholesterol is at goal. The cholesterol last visit was:   Lab Results  Component Value Date   CHOL 176 08/01/2014   HDL 43 08/01/2014   LDLCALC 113* 08/01/2014   TRIG 99 08/01/2014   CHOLHDL 4.1 08/01/2014    She has been working on diet and exercise for prediabetes, and denies paresthesia of the feet, polydipsia, polyuria and visual disturbances. Last A1C in the office was:  Lab Results  Component Value Date   HGBA1C 5.8* 08/01/2014  Patient is on Vitamin D supplement.   Lab Results  Component Value Date   VD25OH 11* 08/01/2014  She has had allergies with clear mucus, left sided HA x few weeks.    BMI is Body mass index is 42.57 kg/(m^2)., she is working on diet and exercise.Has been on phentermine, has ran out for 1 month, has not lost anymore weight. Has been  on phentermine for 2 months.  Wt Readings from Last 3 Encounters:  12/02/14 229 lb (103.874 kg)  09/09/14 229 lb (103.874 kg)  08/01/14 235 lb (106.595 kg)    Current Medications:  Current Outpatient Prescriptions on File Prior to Visit  Medication Sig Dispense Refill  . fluconazole (DIFLUCAN) 150 MG tablet Take 1 tablet (150 mg total) by mouth daily. 1 tablet 3  . hydrochlorothiazide (MICROZIDE) 12.5 MG capsule Take 1 capsule (12.5 mg total) by mouth daily. 30 capsule 3  . ibuprofen (ADVIL,MOTRIN) 200 MG tablet Take 600 mg by mouth every 6 (six) hours as needed for mild pain.    . Multiple Vitamins-Minerals (MULTIVITAMIN GUMMIES ADULT PO) Take by mouth. DAILY    . phentermine (ADIPEX-P) 37.5 MG tablet Take 1 tablet (37.5 mg total) by mouth daily before breakfast. 30 tablet 1   No current facility-administered medications on file prior to visit.   Medical History:  Past Medical History  Diagnosis Date  . Abnormal Pap smear   . Cervical polyp   . Headache(784.0)   . Toxemia in pregnancy   . Depression   . Migraines   . Vitamin D deficiency   . Morbid obesity   . Prediabetes   . Umbilical hernia     SOME ABDOMINAL DISCOMFORT  . UTI (urinary tract infection)     ON MEDICATION AS OF 6/10 FOR UTI - HX OF FREQUENT UTI'S   Allergies: No Known Allergies  Review of Systems:  Review of Systems  Constitutional: Negative.   HENT: Negative.   Eyes: Negative.   Respiratory: Negative.   Cardiovascular: Negative.   Gastrointestinal: Negative.   Genitourinary: Negative.   Musculoskeletal: Negative.   Skin: Negative.   Neurological: Negative.   Endo/Heme/Allergies: Negative.   Psychiatric/Behavioral: Negative.     Family history- Review and unchanged Social history- Review and unchanged Physical Exam: BP 110/72 mmHg  Pulse 88  Temp(Src) 97.7 F (36.5 C)  Resp 16  Ht 5' 1.5" (1.562 m)  Wt 229 lb (103.874 kg)  BMI 42.57 kg/m2 Wt Readings from Last 3 Encounters:  12/02/14  229 lb (103.874 kg)  09/09/14 229 lb (103.874 kg)  08/01/14 235 lb (106.595 kg)   General Appearance: Well nourished, in no apparent distress. Eyes: PERRLA, EOMs, conjunctiva no swelling or erythema Sinuses: No Frontal/maxillary tenderness ENT/Mouth: Ext aud canals clear, TMs without erythema, bulging. No erythema, swelling, or exudate on post pharynx.  Tonsils not swollen or erythematous. Hearing normal.  Neck: Supple, thyroid normal.  Respiratory: Respiratory effort normal, BS equal bilaterally without rales, rhonchi, wheezing or stridor.  Cardio: RRR with no MRGs. Brisk peripheral pulses without edema.  Abdomen: Soft, + BS,  Non tender, no guarding, rebound, hernias, masses. Lymphatics: Non tender without lymphadenopathy.  Musculoskeletal: Full ROM, 5/5 strength, Normal gait Skin: Warm, dry without rashes, lesions, ecchymosis.  Neuro: Cranial nerves intact. Normal muscle tone, no cerebellar symptoms. Psych: Awake and oriented X 3, normal affect, Insight and Judgment appropriate.    Vicie Mutters, PA-C 9:44 AM [facility]

## 2014-12-02 NOTE — Patient Instructions (Signed)
Before you even begin to attack a weight-loss plan, it pays to remember this: You are not fat. You have fat. Losing weight isn't about blame or shame; it's simply another achievement to accomplish. Dieting is like any other skill-you have to buckle down and work at it. As long as you act in a smart, reasonable way, you'll ultimately get where you want to be. Here are some weight loss pearls for you.  1. It's Not a Diet. It's a Lifestyle Thinking of a diet as something you're on and suffering through only for the short term doesn't work. To shed weight and keep it off, you need to make permanent changes to the way you eat. It's OK to indulge occasionally, of course, but if you cut calories temporarily and then revert to your old way of eating, you'll gain back the weight quicker than you can say yo-yo. Use it to lose it. Research shows that one of the best predictors of long-term weight loss is how many pounds you drop in the first month. For that reason, nutritionists often suggest being stricter for the first two weeks of your new eating strategy to build momentum. Cut out added sugar and alcohol and avoid unrefined carbs. After that, figure out how you can reincorporate them in a way that's healthy and maintainable.  2. There's a Right Way to Exercise Working out burns calories and fat and boosts your metabolism by building muscle. But those trying to lose weight are notorious for overestimating the number of calories they burn and underestimating the amount they take in. Unfortunately, your system is biologically programmed to hold on to extra pounds and that means when you start exercising, your body senses the deficit and ramps up its hunger signals. If you're not diligent, you'll eat everything you burn and then some. Use it to lose it. Cardio gets all the exercise glory, but strength and interval training are the real heroes. They help you build lean muscle, which in turn increases your metabolism and  calorie-burning ability 3. Don't Overreact to Mild Hunger Some people have a hard time losing weight because of hunger anxiety. To them, being hungry is bad-something to be avoided at all costs-so they carry snacks with them and eat when they don't need to. Others eat because they're stressed out or bored. While you never want to get to the point of being ravenous (that's when bingeing is likely to happen), a hunger pang, a craving, or the fact that it's 3:00 p.m. should not send you racing for the vending machine or obsessing about the energy bar in your purse. Ideally, you should put off eating until your stomach is growling and it's difficult to concentrate.  Use it to lose it. When you feel the urge to eat, use the HALT method. Ask yourself, Am I really hungry? Or am I angry or anxious, lonely or bored, or tired? If you're still not certain, try the apple test. If you're truly hungry, an apple should seem delicious; if it doesn't, something else is going on. Or you can try drinking water and making yourself busy, if you are still hungry try a healthy snack.  4. Not All Calories Are Created Equal The mechanics of weight loss are pretty simple: Take in fewer calories than you use for energy. But the kind of food you eat makes all the difference. Processed food that's high in saturated fat and refined starch or sugar can cause inflammation that disrupts the hormone signals that tell   your brain you're full. The result: You eat a lot more.  Use it to lose it. Clean up your diet. Swap in whole, unprocessed foods, including vegetables, lean protein, and healthy fats that will fill you up and give you the biggest nutritional bang for your calorie buck. In a few weeks, as your brain starts receiving regular hunger and fullness signals once again, you'll notice that you feel less hungry overall and naturally start cutting back on the amount you eat.  5. Protein, Produce, and Plant-Based Fats Are Your Weight-Loss  [place] Here's why eating the three Ps regularly will help you drop pounds. Protein fills you up. You need it to build lean muscle, which keeps your metabolism humming so that you can torch more fat. People in a weight-loss program who ate double the recommended daily allowance for protein (about 110 grams for a 150-pound woman) lost 70 percent of their weight from fat, while people who ate the RDA lost only about 40 percent, one study found. Produce is packed with filling fiber. "It's very difficult to consume too many calories if you're eating a lot of vegetables. Example: Three cups of broccoli is a lot of food, yet only 93 calories. (Fruit is another story. It can be easy to overeat and can contain a lot of calories from sugar, so be sure to monitor your intake.) Plant-based fats like olive oil and those in avocados and nuts are healthy and extra satiating.  Use it to lose it. Aim to incorporate each of the three Ps into every meal and snack. People who eat protein throughout the day are able to keep weight off, according to a study in the American Journal of Clinical Nutrition. In addition to meat, poultry and seafood, good sources are beans, lentils, eggs, tofu, and yogurt. As for fat, keep portion sizes in check by measuring out salad dressing, oil, and nut butters (shoot for one to two tablespoons). Finally, eat veggies or a little fruit at every meal. People who did that consumed 308 fewer calories but didn't feel any hungrier than when they didn't eat more produce.  7. How You Eat Is As Important As What You Eat In order for your brain to register that you're full, you need to focus on what you're eating. Sit down whenever you eat, preferably at a table. Turn off the TV or computer, put down your phone, and look at your food. Smell it. Chew slowly, and don't put another bite on your fork until you swallow. When women ate lunch this attentively, they consumed 30 percent less when snacking later than  those who listened to an audiobook at lunchtime, according to a study in the [employer] Journal of Nutrition. 8. Weighing Yourself Really Works The scale provides the best evidence about whether your efforts are paying off. Seeing the numbers tick up or down or stagnate is motivation to keep going-or to rethink your approach. A 2015 study at [employer] found that daily weigh-ins helped people lose more weight, keep it off, and maintain that loss, even after two years. Use it to lose it. Step on the scale at the same time every day for the best results. If your weight shoots up several pounds from one weigh-in to the next, don't freak out. Eating a lot of salt the night before or having your period is the likely culprit. The number should return to normal in a day or two. It's a steady climb that you need to do something about.   9. Too Much Stress and Too Little Sleep Are Your Enemies When you're tired and frazzled, your body cranks up the production of cortisol, the stress hormone that can cause carb cravings. Not getting enough sleep also boosts your levels of ghrelin, a hormone associated with hunger, while suppressing leptin, a hormone that signals fullness and satiety. People on a diet who slept only five and a half hours a night for two weeks lost 55 percent less fat and were hungrier than those who slept eight and a half hours, according to a study in the [employer] Journal. Use it to lose it. Prioritize sleep, aiming for seven hours or more a night, which research shows helps lower stress. And make sure you're getting quality zzz's. If a snoring spouse or a fidgety cat wakes you up frequently throughout the night, you may end up getting the equivalent of just four hours of sleep, according to a study from [employer]. Keep pets out of the bedroom, and use a white-noise app to drown out snoring. 10. You Will Hit a plateau-And You Can Bust Through It As you slim down, your  body releases much less leptin, the fullness hormone.  If you're not strength training, start right now. Building muscle can raise your metabolism to help you overcome a plateau. To keep your body challenged and burning calories, incorporate new moves and more intense intervals into your workouts or add another sweat session to your weekly routine. Alternatively, cut an extra 100 calories or so a day from your diet. Now that you've lost weight, your body simply doesn't need as much fuel.   Ways to cut 100 calories  1. Eat your eggs with hot sauce OR salsa instead of cheese.  Eggs are great for breakfast, but many people consider eggs and cheese to be BFFs. Instead of cheese-1 oz. of cheddar has 114 calories-top your eggs with hot sauce, which contains no calories and helps with satiety and metabolism. Salsa is also a great option!!  2. Top your toast, waffles or pancakes with mashed berries instead of jelly or syrup. Half a cup of berries-fresh, frozen or thawed-has about 40 calories, compared with 2 tbsp. of maple syrup or jelly, which both have about 100 calories. The berries will also give you a good punch of fiber, which helps keep you full and satisfied and won't spike blood sugar quickly like the jelly or syrup. 3. Swap the non-fat latte for black coffee with a splash of half-and-half. Contrary to its name, that non-fat latte has 130 calories and a startling 19g of carbohydrates per 16 oz. serving. Replacing that 'light' drinkable dessert with a black coffee with a splash of half-and-half saves you more than 100 calories per 16 oz. serving. 4. Sprinkle salads with freeze-dried raspberries instead of dried cranberries. If you want a sweet addition to your nutritious salad, stay away from dried cranberries. They have a whopping 130 calories per  cup and 30g carbohydrates. Instead, sprinkle freeze-dried raspberries guilt-free and save more than 100 calories per  cup serving, adding 3g of belly-filling  fiber. 5. Go for mustard in place of mayo on your sandwich. Mustard can add really nice flavor to any sandwich, and there are tons of varieties, from spicy to honey. A serving of mayo is 95 calories, versus 10 calories in a serving of mustard. 6. Choose a DIY salad dressing instead of the store-bought kind. Mix Dijon or whole grain mustard with low-fat Kefir or red wine vinegar   and garlic. 7. Use hummus as a spread instead of a dip. Use hummus as a spread on a high-fiber cracker or tortilla with a sandwich and save on calories without sacrificing taste. 8. Pick just one salad "accessory." Salad isn't automatically a calorie winner. It's easy to over-accessorize with toppings. Instead of topping your salad with nuts, avocado and cranberries (all three will clock in at 313 calories), just pick one. The next day, choose a different accessory, which will also keep your salad interesting. You don't wear all your jewelry every day, right? 9. Ditch the white pasta in favor of spaghetti squash. One cup of cooked spaghetti squash has about 40 calories, compared with traditional spaghetti, which comes with more than 200. Spaghetti squash is also nutrient-dense. It's a good source of fiber and Vitamins A and C, and it can be eaten just like you would eat pasta-with a great tomato sauce and turkey meatballs or with pesto, tofu and spinach, for example. 10. Dress up your chili, soups and stews with non-fat Greek yogurt instead of sour cream. Just a 'dollop' of sour cream can set you back 115 calories and a whopping 12g of fat-seven of which are of the artery-clogging variety. Added bonus: Greek yogurt is packed with muscle-building protein, calcium and B Vitamins. 11. Mash cauliflower instead of mashed potatoes. One cup of traditional mashed potatoes-in all their creamy goodness-has more than 200 calories, compared to mashed cauliflower, which you can typically eat for less than 100 calories per 1 cup serving.  Cauliflower is a great source of the antioxidant indole-3-carbinol (I3C), which may help reduce the risk of some cancers, like breast cancer. 12. Ditch the ice cream sundae in favor of a Greek yogurt parfait. Instead of a cup of ice cream or fro-yo for dessert, try 1 cup of nonfat Greek yogurt topped with fresh berries and a sprinkle of cacao nibs. Both toppings are packed with antioxidants, which can help reduce cellular inflammation and oxidative damage. And the comparison is a no-brainer: One cup of ice cream has about 275 calories; one cup of frozen yogurt has about 230; and a cup of Greek yogurt has just 130, plus twice the protein, so you're less likely to return to the freezer for a second helping. 13. Put olive oil in a spray container instead of using it directly from the bottle. Each tablespoon of olive oil is 120 calories and 15g of fat. Use a mister instead of pouring it straight into the pan or onto a salad. This allows for portion control and will save you more than 100 calories. 14. When baking, substitute canned pumpkin for butter or oil. Canned pumpkin-not pumpkin pie mix-is loaded with Vitamin A, which is important for skin and eye health, as well as immunity. And the comparisons are pretty crazy:  cup of canned pumpkin has about 40 calories, compared to butter or oil, which has more than 800 calories. Yes, 800 calories. Applesauce and mashed banana can also serve as good substitutions for butter or oil, usually in a 1:1 ratio. 15. Top casseroles with high-fiber cereal instead of breadcrumbs. Breadcrumbs are typically made with white bread, while breakfast cereals contain 5-9g of fiber per serving. Not only will you save more than 150 calories per  cup serving, the swap will also keep you more full and you'll get a metabolism boost from the added fiber. 16. Snack on pistachios instead of macadamia nuts. Believe it or not, you get the same amount of calories from 35   pistachios (100  calories) as you would from only five macadamia nuts. 17. Chow down on kale chips rather than potato chips. This is my favorite 'don't knock it 'till you try it' swap. Kale chips are so easy to make at home, and you can spice them up with a little grated parmesan or chili powder. Plus, they're a mere fraction of the calories of potato chips, but with the same crunch factor we crave so often. 18. Add seltzer and some fruit slices to your cocktail instead of soda or fruit juice. One cup of soda or fruit juice can pack on as much as 140 calories. Instead, use seltzer and fruit slices. The fruit provides valuable phytochemicals, such as flavonoids and anthocyanins, which help to combat cancer and stave off the aging process.  

## 2014-12-03 LAB — INSULIN, FASTING: INSULIN FASTING, SERUM: 17.4 u[IU]/mL (ref 2.0–19.6)

## 2014-12-03 LAB — VITAMIN D 25 HYDROXY (VIT D DEFICIENCY, FRACTURES): Vit D, 25-Hydroxy: 16 ng/mL — ABNORMAL LOW (ref 30–100)

## 2015-03-04 ENCOUNTER — Encounter: Payer: Self-pay | Admitting: Physician Assistant

## 2015-03-04 ENCOUNTER — Ambulatory Visit (INDEPENDENT_AMBULATORY_CARE_PROVIDER_SITE_OTHER): Payer: BC Managed Care – PPO | Admitting: Physician Assistant

## 2015-03-04 VITALS — BP 130/86 | HR 80 | Temp 98.0°F | Resp 16 | Ht 61.5 in | Wt 229.0 lb

## 2015-03-04 DIAGNOSIS — R7309 Other abnormal glucose: Secondary | ICD-10-CM

## 2015-03-04 DIAGNOSIS — E559 Vitamin D deficiency, unspecified: Secondary | ICD-10-CM | POA: Diagnosis not present

## 2015-03-04 DIAGNOSIS — Z79899 Other long term (current) drug therapy: Secondary | ICD-10-CM | POA: Diagnosis not present

## 2015-03-04 DIAGNOSIS — R7303 Prediabetes: Secondary | ICD-10-CM

## 2015-03-04 DIAGNOSIS — I10 Essential (primary) hypertension: Secondary | ICD-10-CM | POA: Diagnosis not present

## 2015-03-04 DIAGNOSIS — G43909 Migraine, unspecified, not intractable, without status migrainosus: Secondary | ICD-10-CM | POA: Diagnosis not present

## 2015-03-04 LAB — CBC WITH DIFFERENTIAL/PLATELET
BASOS ABS: 0 10*3/uL (ref 0.0–0.1)
Basophils Relative: 0 % (ref 0–1)
EOS ABS: 0.1 10*3/uL (ref 0.0–0.7)
EOS PCT: 1 % (ref 0–5)
HEMATOCRIT: 38.6 % (ref 36.0–46.0)
Hemoglobin: 12.4 g/dL (ref 12.0–15.0)
Lymphocytes Relative: 24 % (ref 12–46)
Lymphs Abs: 2.2 10*3/uL (ref 0.7–4.0)
MCH: 26.1 pg (ref 26.0–34.0)
MCHC: 32.1 g/dL (ref 30.0–36.0)
MCV: 81.1 fL (ref 78.0–100.0)
MONO ABS: 0.8 10*3/uL (ref 0.1–1.0)
MPV: 9 fL (ref 8.6–12.4)
Monocytes Relative: 9 % (ref 3–12)
Neutro Abs: 6 10*3/uL (ref 1.7–7.7)
Neutrophils Relative %: 66 % (ref 43–77)
Platelets: 302 10*3/uL (ref 150–400)
RBC: 4.76 MIL/uL (ref 3.87–5.11)
RDW: 14.1 % (ref 11.5–15.5)
WBC: 9.1 10*3/uL (ref 4.0–10.5)

## 2015-03-04 MED ORDER — NALTREXONE-BUPROPION HCL ER 8-90 MG PO TB12: ORAL_TABLET | ORAL | Status: DC

## 2015-03-04 NOTE — Patient Instructions (Addendum)
[facility] healthy weight loss  Will try contrave. You increase the dose slowly over time. Nausea is the most common side effect but goes a way with time.   We want weight loss that will last so you should lose 1-2 pounds a week.  THAT IS IT! Please pick THREE things a month to change. Once it is a habit check off the item. Then pick another three items off the list to become habits.  If you are already doing a habit on the list GREAT!  Cross that item off! o Don't drink your calories. Ie, alcohol, soda, fruit juice, and sweet tea.  o Drink more water. Drink a glass when you feel hungry or before each meal.  o Eat breakfast - Complex carb and protein (likeDannon light and fit yogurt, oatmeal, fruit, eggs, [place] bacon). o Measure your cereal.  Eat no more than one cup a day. (ie [place]) o Eat an apple a day. o Add a vegetable a day. o Try a new vegetable a month. o Use Pam! Stop using oil or butter to cook. o Don't finish your plate or use smaller plates. o Share your dessert. o Eat sugar free Jello for dessert or frozen grapes. o Don't eat 2-3 hours before bed. o Switch to whole wheat bread, pasta, and brown rice. o Make healthier choices when you eat out. No fries! o Pick baked chicken, NOT fried. o Don't forget to SLOW DOWN when you eat. It is not going anywhere.  o Take the stairs. o Park far away in the parking lot o [employer] (or weights) for 10 minutes while watching TV. o Walk at work for 10 minutes during break. o Walk outside 1 time a week with your friend, kids, dog, or significant other. o Start a walking group at [place] the mall as much as you can tolerate.  o Keep a food diary. o Weigh yourself daily. o Walk for 15 minutes 3 days per week. o Cook at home more often and eat out less.  If life happens and you go back to old habits, it is okay.  Just start over. You can do it!   If you experience chest pain, get short of breath, or tired during the exercise,  please stop immediately and inform your doctor.   Before you even begin to attack a weight-loss plan, it pays to remember this: You are not fat. You have fat. Losing weight isn't about blame or shame; it's simply another achievement to accomplish. Dieting is like any other skill-you have to buckle down and work at it. As long as you act in a smart, reasonable way, you'll ultimately get where you want to be. Here are some weight loss pearls for you.  1. It's Not a Diet. It's a Lifestyle Thinking of a diet as something you're on and suffering through only for the short term doesn't work. To shed weight and keep it off, you need to make permanent changes to the way you eat. It's OK to indulge occasionally, of course, but if you cut calories temporarily and then revert to your old way of eating, you'll gain back the weight quicker than you can say yo-yo. Use it to lose it. Research shows that one of the best predictors of long-term weight loss is how many pounds you drop in the first month. For that reason, nutritionists often suggest being stricter for the first two weeks of your new eating strategy to build momentum. Cut  out added sugar and alcohol and avoid unrefined carbs. After that, figure out how you can reincorporate them in a way that's healthy and maintainable.  2. There's a Right Way to Exercise Working out burns calories and fat and boosts your metabolism by building muscle. But those trying to lose weight are notorious for overestimating the number of calories they burn and underestimating the amount they take in. Unfortunately, your system is biologically programmed to hold on to extra pounds and that means when you start exercising, your body senses the deficit and ramps up its hunger signals. If you're not diligent, you'll eat everything you burn and then some. Use it to lose it. Cardio gets all the exercise glory, but strength and interval training are the real heroes. They help you build lean  muscle, which in turn increases your metabolism and calorie-burning ability 3. Don't Overreact to Mild Hunger Some people have a hard time losing weight because of hunger anxiety. To them, being hungry is bad-something to be avoided at all costs-so they carry snacks with them and eat when they don't need to. Others eat because they're stressed out or bored. While you never want to get to the point of being ravenous (that's when bingeing is likely to happen), a hunger pang, a craving, or the fact that it's 3:00 p.m. should not send you racing for the vending machine or obsessing about the energy bar in your purse. Ideally, you should put off eating until your stomach is growling and it's difficult to concentrate.  Use it to lose it. When you feel the urge to eat, use the HALT method. Ask yourself, Am I really hungry? Or am I angry or anxious, lonely or bored, or tired? If you're still not certain, try the apple test. If you're truly hungry, an apple should seem delicious; if it doesn't, something else is going on. Or you can try drinking water and making yourself busy, if you are still hungry try a healthy snack.  4. Not All Calories Are Created Equal The mechanics of weight loss are pretty simple: Take in fewer calories than you use for energy. But the kind of food you eat makes all the difference. Processed food that's high in saturated fat and refined starch or sugar can cause inflammation that disrupts the hormone signals that tell your brain you're full. The result: You eat a lot more.  Use it to lose it. Clean up your diet. Swap in whole, unprocessed foods, including vegetables, lean protein, and healthy fats that will fill you up and give you the biggest nutritional bang for your calorie buck. In a few weeks, as your brain starts receiving regular hunger and fullness signals once again, you'll notice that you feel less hungry overall and naturally start cutting back on the amount you eat.  5. Protein,  Produce, and Plant-Based Fats Are Your Weight-Loss Trinity Here's why eating the three Ps regularly will help you drop pounds. Protein fills you up. You need it to build lean muscle, which keeps your metabolism humming so that you can torch more fat. People in a weight-loss program who ate double the recommended daily allowance for protein (about 110 grams for a 150-pound woman) lost 70 percent of their weight from fat, while people who ate the RDA lost only about 40 percent, one study found. Produce is packed with filling fiber. "It's very difficult to consume too many calories if you're eating a lot of vegetables. Example: Three cups of broccoli is a  lot of food, yet only 93 calories. (Fruit is another story. It can be easy to overeat and can contain a lot of calories from sugar, so be sure to monitor your intake.) Plant-based fats like olive oil and those in avocados and nuts are healthy and extra satiating.  Use it to lose it. Aim to incorporate each of the three Ps into every meal and snack. People who eat protein throughout the day are able to keep weight off, according to a study in the [place] of Clinical Nutrition. In addition to meat, poultry and seafood, good sources are beans, lentils, eggs, tofu, and yogurt. As for fat, keep portion sizes in check by measuring out salad dressing, oil, and nut butters (shoot for one to two tablespoons). Finally, eat veggies or a little fruit at every meal. People who did that consumed 308 fewer calories but didn't feel any hungrier than when they didn't eat more produce.  7. How You Eat Is As Important As What You Eat In order for your brain to register that you're full, you need to focus on what you're eating. Sit down whenever you eat, preferably at a table. Turn off the TV or computer, put down your phone, and look at your food. Smell it. Chew slowly, and don't put another bite on your fork until you swallow. When women ate lunch this attentively,  they consumed 30 percent less when snacking later than those who listened to an audiobook at lunchtime, according to a study in the [place] of Nutrition. 8. Weighing Yourself Really Works The scale provides the best evidence about whether your efforts are paying off. Seeing the numbers tick up or down or stagnate is motivation to keep going-or to rethink your approach. A 2015 study at [facility] found that daily weigh-ins helped people lose more weight, keep it off, and maintain that loss, even after two years. Use it to lose it. Step on the scale at the same time every day for the best results. If your weight shoots up several pounds from one weigh-in to the next, don't freak out. Eating a lot of salt the night before or having your period is the likely culprit. The number should return to normal in a day or two. It's a steady climb that you need to do something about. 9. Too Much Stress and Too Little Sleep Are Your Enemies When you're tired and frazzled, your body cranks up the production of cortisol, the stress hormone that can cause carb cravings. Not getting enough sleep also boosts your levels of ghrelin, a hormone associated with hunger, while suppressing leptin, a hormone that signals fullness and satiety. People on a diet who slept only five and a half hours a night for two weeks lost 55 percent less fat and were hungrier than those who slept eight and a half hours, according to a study in the [place]. Use it to lose it. Prioritize sleep, aiming for seven hours or more a night, which research shows helps lower stress. And make sure you're getting quality zzz's. If a snoring spouse or a fidgety cat wakes you up frequently throughout the night, you may end up getting the equivalent of just four hours of sleep, according to a study from [facility]. Keep pets out of the bedroom, and use a white-noise app to drown out snoring. 10. You Will Hit a  plateau-And You Can Bust Through It As you slim down, your body releases much less  leptin, the fullness hormone.  If you're not strength training, start right now. Building muscle can raise your metabolism to help you overcome a plateau. To keep your body challenged and burning calories, incorporate new moves and more intense intervals into your workouts or add another sweat session to your weekly routine. Alternatively, cut an extra 100 calories or so a day from your diet. Now that you've lost weight, your body simply doesn't need as much fuel.

## 2015-03-04 NOTE — Progress Notes (Signed)
Assessment and Plan:  1. Hypertension -Continue medication, monitor blood pressure at home. Continue DASH diet.  Reminder to go to the ER if any CP, SOB, nausea, dizziness, severe HA, changes vision/speech, left arm numbness and tingling and jaw pain.  2. Cholesterol -Continue diet and exercise. Check cholesterol.   3. Prediabetes  -Continue diet and exercise. Check A1C  4. Vitamin D Def - check level and continue medications.   5. Obesity with co morbidities  long discussion about weight loss, diet, and exercise Will try contrave  6. allergies Add nasal spray  Continue diet and meds as discussed. Further disposition pending results of labs. Over 30 minutes of exam, counseling, chart review, and critical decision making was performed  HPI 39 y.o. female  presents for 3 month follow up on hypertension, cholesterol, prediabetes, and vitamin D deficiency.   Her blood pressure has been controlled at home, today their BP is BP: 130/86 mmHg  She does workout. She denies chest pain, shortness of breath, dizziness.  She is not on cholesterol medication and denies myalgias. Her cholesterol is at goal. The cholesterol last visit was:   Lab Results  Component Value Date   CHOL 176 12/02/2014   HDL 46 12/02/2014   LDLCALC 103* 12/02/2014   TRIG 134 12/02/2014   CHOLHDL 3.8 12/02/2014    She has been working on diet and exercise for prediabetes, and denies paresthesia of the feet, polydipsia, polyuria and visual disturbances. Last A1C in the office was:  Lab Results  Component Value Date   HGBA1C 5.9* 12/02/2014  Patient is NOT on Vitamin D supplement.   Lab Results  Component Value Date   VD25OH 16* 12/02/2014  She has had allergies with clear mucus, left sided HA x few weeks.    BMI is Body mass index is 42.57 kg/(m^2)., she is working on diet and exercise. Has been on phentermine for several months without helping.  Wt Readings from Last 3 Encounters:  03/04/15 229 lb (103.874  kg)  12/02/14 229 lb (103.874 kg)  09/09/14 229 lb (103.874 kg)    Current Medications:  Current Outpatient Prescriptions on File Prior to Visit  Medication Sig Dispense Refill  . fluconazole (DIFLUCAN) 150 MG tablet Take 1 tablet (150 mg total) by mouth daily. 1 tablet 3  . hydrochlorothiazide (MICROZIDE) 12.5 MG capsule Take 1 capsule (12.5 mg total) by mouth daily. 30 capsule 3  . ibuprofen (ADVIL,MOTRIN) 200 MG tablet Take 600 mg by mouth every 6 (six) hours as needed for mild pain.    . Multiple Vitamins-Minerals (MULTIVITAMIN GUMMIES ADULT PO) Take by mouth. DAILY    . phentermine (ADIPEX-P) 37.5 MG tablet Take 1 tablet (37.5 mg total) by mouth daily before breakfast. 30 tablet 2  . SUMAtriptan (IMITREX) 100 MG tablet Take 1 tablet (100 mg total) by mouth once as needed for migraine. May repeat in 2 hours if headache persists or recurs. 6 tablet 2   No current facility-administered medications on file prior to visit.   Medical History:  Past Medical History  Diagnosis Date  . Abnormal Pap smear   . Cervical polyp   . Headache(784.0)   . Toxemia in pregnancy   . Depression   . Migraines   . Vitamin D deficiency   . Morbid obesity   . Prediabetes   . Umbilical hernia     SOME ABDOMINAL DISCOMFORT  . UTI (urinary tract infection)     ON MEDICATION AS OF 6/10 FOR UTI - HX  OF FREQUENT UTI'S   Allergies: No Known Allergies   Review of Systems:  Review of Systems  Constitutional: Negative.   HENT: Negative.   Eyes: Negative.   Respiratory: Negative.   Cardiovascular: Negative.   Gastrointestinal: Negative.   Genitourinary: Negative.   Musculoskeletal: Negative.   Skin: Negative.   Neurological: Negative.   Endo/Heme/Allergies: Negative.   Psychiatric/Behavioral: Negative.     Family history- Review and unchanged Social history- Review and unchanged Physical Exam: BP 130/86 mmHg  Pulse 80  Temp(Src) 98 F (36.7 C)  Resp 16  Ht 5' 1.5" (1.562 m)  Wt 229 lb  (103.874 kg)  BMI 42.57 kg/m2 Wt Readings from Last 3 Encounters:  03/04/15 229 lb (103.874 kg)  12/02/14 229 lb (103.874 kg)  09/09/14 229 lb (103.874 kg)   General Appearance: Well nourished, in no apparent distress. Eyes: PERRLA, EOMs, conjunctiva no swelling or erythema Sinuses: No Frontal/maxillary tenderness ENT/Mouth: Ext aud canals clear, TMs without erythema, bulging. No erythema, swelling, or exudate on post pharynx.  Tonsils not swollen or erythematous. Hearing normal.  Neck: Supple, thyroid normal.  Respiratory: Respiratory effort normal, BS equal bilaterally without rales, rhonchi, wheezing or stridor.  Cardio: RRR with no MRGs. Brisk peripheral pulses without edema.  Abdomen: Soft, + BS,  Non tender, no guarding, rebound, hernias, masses. Lymphatics: Non tender without lymphadenopathy.  Musculoskeletal: Full ROM, 5/5 strength, Normal gait Skin: Warm, dry without rashes, lesions, ecchymosis.  Neuro: Cranial nerves intact. Normal muscle tone, no cerebellar symptoms. Psych: Awake and oriented X 3, normal affect, Insight and Judgment appropriate.    Vicie Mutters, PA-C 11:02 AM [facility]

## 2015-03-05 LAB — BASIC METABOLIC PANEL WITH GFR
BUN: 9 mg/dL (ref 7–25)
CALCIUM: 8.9 mg/dL (ref 8.6–10.2)
CO2: 25 mmol/L (ref 20–31)
Chloride: 105 mmol/L (ref 98–110)
Creat: 0.72 mg/dL (ref 0.50–1.10)
GFR, Est African American: >89 mL/min (ref >=60)
GLUCOSE: 78 mg/dL (ref 65–99)
POTASSIUM: 4.1 mmol/L (ref 3.5–5.3)
Sodium: 139 mmol/L (ref 135–146)

## 2015-03-05 LAB — HEPATIC FUNCTION PANEL
ALBUMIN: 3.7 g/dL (ref 3.6–5.1)
ALT: 15 U/L (ref 6–29)
AST: 19 U/L (ref 10–30)
Alkaline Phosphatase: 68 U/L (ref 33–115)
BILIRUBIN INDIRECT: 0.2 mg/dL (ref 0.2–1.2)
Bilirubin, Direct: 0.1 mg/dL (ref <=0.2)
TOTAL PROTEIN: 6.7 g/dL (ref 6.1–8.1)
Total Bilirubin: 0.3 mg/dL (ref 0.2–1.2)

## 2015-03-05 LAB — LIPID PANEL
CHOLESTEROL: 173 mg/dL (ref 125–200)
HDL: 40 mg/dL — AB (ref >=46)
LDL Cholesterol: 100 mg/dL (ref <130)
TRIGLYCERIDES: 164 mg/dL — AB (ref <150)
Total CHOL/HDL Ratio: 4.3 Ratio (ref <=5.0)
VLDL: 33 mg/dL — ABNORMAL HIGH (ref <30)

## 2015-03-05 LAB — VITAMIN D 25 HYDROXY (VIT D DEFICIENCY, FRACTURES): Vit D, 25-Hydroxy: 20 ng/mL — ABNORMAL LOW (ref 30–100)

## 2015-03-05 LAB — TSH: TSH: 0.947 u[IU]/mL (ref 0.350–4.500)

## 2015-03-05 LAB — MAGNESIUM: MAGNESIUM: 1.9 mg/dL (ref 1.5–2.5)

## 2015-03-05 LAB — HEMOGLOBIN A1C
Hgb A1c MFr Bld: 5.7 % — ABNORMAL HIGH (ref <5.7)
Mean Plasma Glucose: 117 mg/dL — ABNORMAL HIGH (ref <117)

## 2015-03-05 LAB — INSULIN, FASTING: INSULIN FASTING, SERUM: 19.8 u[IU]/mL — AB (ref 2.0–19.6)

## 2015-05-28 ENCOUNTER — Encounter: Payer: Self-pay | Admitting: Physician Assistant

## 2015-05-28 ENCOUNTER — Ambulatory Visit (INDEPENDENT_AMBULATORY_CARE_PROVIDER_SITE_OTHER): Payer: BC Managed Care – PPO | Admitting: Physician Assistant

## 2015-05-28 VITALS — BP 118/80 | HR 100 | Temp 97.6°F | Resp 16 | Ht 61.5 in | Wt 225.0 lb

## 2015-05-28 DIAGNOSIS — N3000 Acute cystitis without hematuria: Secondary | ICD-10-CM

## 2015-05-28 MED ORDER — FLUCONAZOLE 150 MG PO TABS: 150.0000 mg | ORAL_TABLET | Freq: Every day | ORAL | Status: DC

## 2015-05-28 MED ORDER — CIPROFLOXACIN HCL 500 MG PO TABS: 500.0000 mg | ORAL_TABLET | Freq: Two times a day (BID) | ORAL | Status: AC

## 2015-05-28 MED ORDER — NALTREXONE-BUPROPION HCL ER 8-90 MG PO TB12: ORAL_TABLET | ORAL | Status: DC

## 2015-05-28 NOTE — Progress Notes (Signed)
HPI: complains of UTI symptoms Onset 4 days ago, progressively worse, on Azo associated with dysuria and small volume voiding with increased frequency denies hematuria, flank pain or fever The patient has a history of prior UTI, gets once a year  Husband lost job so she has gained back some of her weight but has been doing well on the contrave.  PMH:  Current Outpatient Prescriptions on File Prior to Visit  Medication Sig Dispense Refill  . fluconazole (DIFLUCAN) 150 MG tablet Take 1 tablet (150 mg total) by mouth daily. 1 tablet 3  . hydrochlorothiazide (MICROZIDE) 12.5 MG capsule Take 1 capsule (12.5 mg total) by mouth daily. 30 capsule 3  . ibuprofen (ADVIL,MOTRIN) 200 MG tablet Take 600 mg by mouth every 6 (six) hours as needed for mild pain.    . Multiple Vitamins-Minerals (MULTIVITAMIN GUMMIES ADULT PO) Take by mouth. DAILY    . Naltrexone-Bupropion HCl ER (CONTRAVE) 8-90 MG TB12 1 pill a morning for 1 week, then 1 pill twice a day for 1 week, 2 pills in the morning and one at night for 1 week, then 2 pill twice a day for 1 week and stay on this dose. 120 tablet 2  . SUMAtriptan (IMITREX) 100 MG tablet Take 1 tablet (100 mg total) by mouth once as needed for migraine. May repeat in 2 hours if headache persists or recurs. 6 tablet 2   No current facility-administered medications on file prior to visit.   Past Medical History  Diagnosis Date  . Abnormal Pap smear   . Cervical polyp   . Headache(784.0)   . Toxemia in pregnancy   . Depression   . Migraines   . Vitamin D deficiency   . Morbid obesity ([place])   . Prediabetes   . Umbilical hernia     SOME ABDOMINAL DISCOMFORT  . UTI (urinary tract infection)     ON MEDICATION AS OF 6/10 FOR UTI - HX OF FREQUENT UTI'S    ROS:  Gen.: No unexpected weight change, no night sweats Lungs: No cough or shortness of breath Cardiovascular: No palpitations or chest pain  PE: BP 118/80 mmHg  Pulse 100  Temp(Src) 97.6 F (36.4 C)   Resp 16  Ht 5' 1.5" (1.562 m)  Wt 225 lb (102.059 kg)  BMI 41.83 kg/m2  SpO2 96% General: No acute distress Lungs: Clear to auscultation Cardiovascular: Regular rate rhythm, no edema Abdomen: Mild to moderate discomfort of her suprapubic region, no flank tenderness to palpation  Lab Results  Component Value Date   WBC 9.1 03/04/2015   HGB 12.4 03/04/2015   HCT 38.6 03/04/2015   PLT 302 03/04/2015   GLUCOSE 78 03/04/2015   CHOL 173 03/04/2015   TRIG 164* 03/04/2015   HDL 40* 03/04/2015   LDLCALC 100 03/04/2015   ALT 15 03/04/2015   AST 19 03/04/2015   NA 139 03/04/2015   K 4.1 03/04/2015   CL 105 03/04/2015   CREATININE 0.72 03/04/2015   BUN 9 03/04/2015   CO2 25 03/04/2015   TSH 0.947 03/04/2015   HGBA1C 5.7* 03/04/2015    Assessment/Plan: UTI, classic symptoms with history of same  Empiric antibiotic x7 days Urine culture for identification and sensitivities Hydration recommended education provided  Reschedule contrave follow up in 77month

## 2015-05-29 LAB — URINALYSIS, MICROSCOPIC ONLY
BACTERIA UA: NONE SEEN [HPF]
CASTS: NONE SEEN [LPF]
CRYSTALS: NONE SEEN [HPF]
RBC / HPF: NONE SEEN RBC/HPF (ref <=2)
WBC UA: NONE SEEN WBC/HPF (ref <=5)
Yeast: NONE SEEN [HPF]

## 2015-05-29 LAB — URINALYSIS, ROUTINE W REFLEX MICROSCOPIC
BILIRUBIN URINE: NEGATIVE
GLUCOSE, UA: NEGATIVE
Hgb urine dipstick: NEGATIVE
Ketones, ur: NEGATIVE
Nitrite: NEGATIVE
PH: 6.5 (ref 5.0–8.0)
PROTEIN: NEGATIVE
Specific Gravity, Urine: 1.007 (ref 1.001–1.035)

## 2015-05-30 LAB — URINE CULTURE: Colony Count: 50000

## 2015-06-04 ENCOUNTER — Ambulatory Visit: Payer: Self-pay | Admitting: Physician Assistant

## 2015-06-27 ENCOUNTER — Ambulatory Visit: Payer: Self-pay | Admitting: Physician Assistant

## 2015-07-09 ENCOUNTER — Ambulatory Visit (INDEPENDENT_AMBULATORY_CARE_PROVIDER_SITE_OTHER): Payer: BC Managed Care – PPO | Admitting: Physician Assistant

## 2015-07-09 ENCOUNTER — Encounter: Payer: Self-pay | Admitting: Physician Assistant

## 2015-07-09 VITALS — BP 118/82 | HR 100 | Temp 97.3°F | Resp 16 | Ht 61.5 in | Wt 220.0 lb

## 2015-07-09 DIAGNOSIS — E559 Vitamin D deficiency, unspecified: Secondary | ICD-10-CM | POA: Diagnosis not present

## 2015-07-09 DIAGNOSIS — R7303 Prediabetes: Secondary | ICD-10-CM

## 2015-07-09 DIAGNOSIS — Z13220 Encounter for screening for lipoid disorders: Secondary | ICD-10-CM

## 2015-07-09 DIAGNOSIS — R6889 Other general symptoms and signs: Secondary | ICD-10-CM | POA: Diagnosis not present

## 2015-07-09 DIAGNOSIS — Z79899 Other long term (current) drug therapy: Secondary | ICD-10-CM | POA: Diagnosis not present

## 2015-07-09 DIAGNOSIS — I10 Essential (primary) hypertension: Secondary | ICD-10-CM

## 2015-07-09 DIAGNOSIS — Z0001 Encounter for general adult medical examination with abnormal findings: Secondary | ICD-10-CM | POA: Diagnosis not present

## 2015-07-09 DIAGNOSIS — G43909 Migraine, unspecified, not intractable, without status migrainosus: Secondary | ICD-10-CM | POA: Diagnosis not present

## 2015-07-09 LAB — BASIC METABOLIC PANEL WITH GFR
BUN: 8 mg/dL (ref 7–25)
CALCIUM: 8.7 mg/dL (ref 8.6–10.2)
CO2: 22 mmol/L (ref 20–31)
Chloride: 105 mmol/L (ref 98–110)
Creat: 0.8 mg/dL (ref 0.50–1.10)
GLUCOSE: 85 mg/dL (ref 65–99)
POTASSIUM: 4.1 mmol/L (ref 3.5–5.3)
Sodium: 140 mmol/L (ref 135–146)

## 2015-07-09 LAB — CBC WITH DIFFERENTIAL/PLATELET
BASOS ABS: 0 10*3/uL (ref 0.0–0.1)
Basophils Relative: 0 % (ref 0–1)
EOS PCT: 1 % (ref 0–5)
Eosinophils Absolute: 0.1 10*3/uL (ref 0.0–0.7)
HEMATOCRIT: 37 % (ref 36.0–46.0)
HEMOGLOBIN: 12.3 g/dL (ref 12.0–15.0)
LYMPHS ABS: 1.7 10*3/uL (ref 0.7–4.0)
LYMPHS PCT: 21 % (ref 12–46)
MCH: 26.8 pg (ref 26.0–34.0)
MCHC: 33.2 g/dL (ref 30.0–36.0)
MCV: 80.6 fL (ref 78.0–100.0)
MONO ABS: 0.5 10*3/uL (ref 0.1–1.0)
MPV: 9 fL (ref 8.6–12.4)
Monocytes Relative: 6 % (ref 3–12)
NEUTROS ABS: 5.8 10*3/uL (ref 1.7–7.7)
Neutrophils Relative %: 72 % (ref 43–77)
Platelets: 300 10*3/uL (ref 150–400)
RBC: 4.59 MIL/uL (ref 3.87–5.11)
RDW: 14.4 % (ref 11.5–15.5)
WBC: 8 10*3/uL (ref 4.0–10.5)

## 2015-07-09 LAB — LIPID PANEL
CHOLESTEROL: 161 mg/dL (ref 125–200)
HDL: 44 mg/dL — ABNORMAL LOW (ref >=46)
LDL Cholesterol: 98 mg/dL (ref <130)
Total CHOL/HDL Ratio: 3.7 Ratio (ref <=5.0)
Triglycerides: 95 mg/dL (ref <150)
VLDL: 19 mg/dL (ref <30)

## 2015-07-09 LAB — MAGNESIUM: Magnesium: 2.1 mg/dL (ref 1.5–2.5)

## 2015-07-09 LAB — HEPATIC FUNCTION PANEL
ALT: 26 U/L (ref 6–29)
AST: 21 U/L (ref 10–30)
Albumin: 3.7 g/dL (ref 3.6–5.1)
Alkaline Phosphatase: 69 U/L (ref 33–115)
BILIRUBIN DIRECT: 0.1 mg/dL (ref <=0.2)
BILIRUBIN INDIRECT: 0.2 mg/dL (ref 0.2–1.2)
BILIRUBIN TOTAL: 0.3 mg/dL (ref 0.2–1.2)
TOTAL PROTEIN: 6.6 g/dL (ref 6.1–8.1)

## 2015-07-09 LAB — TSH: TSH: 1.407 u[IU]/mL (ref 0.350–4.500)

## 2015-07-09 MED ORDER — NALTREXONE-BUPROPION HCL ER 8-90 MG PO TB12: ORAL_TABLET | ORAL | Status: DC

## 2015-07-09 NOTE — Progress Notes (Signed)
Complete Physical  Assessment and Plan: 1. Essential hypertension - CBC with Differential/Platelet - BASIC METABOLIC PANEL WITH GFR - Hepatic function panel - TSH  2. Prediabetes Discussed general issues about diabetes pathophysiology and management., Educational material distributed., Suggested low cholesterol diet., Encouraged aerobic exercise. - Hemoglobin A1c  3. Morbid obesity, unspecified obesity type ([place]) Obesity with co morbidities- long discussion about weight loss, diet, and exercise - continue contrave  4. Migraine without status migrainosus, not intractable, unspecified migraine type Controlled with diet/avoiding triggers, imitrex PRN  5. Vitamin D deficiency - VITAMIN D 25 Hydroxy (Vit-D Deficiency, Fractures)  6. Medication management - Magnesium  7. Screening cholesterol level - Lipid panel  8. Encounter for general adult medical examination with abnormal findings Get PAP/MGM with Ob/GYN, number given   Discussed med's effects and SE's. Screening labs and tests as requested with regular follow-up as recommended. Over 40 minutes of exam, counseling, chart review, and complex, high level critical decision making was performed this visit.   HPI  39 y.o. female  presents for a complete physical.  Her blood pressure has been controlled at home, takes the HCTZ as needed, today their BP is BP: 118/82 mmHg She does workout. She denies chest pain, shortness of breath, dizziness.  She is not on cholesterol medication and denies myalgias. Her cholesterol is at goal. The cholesterol last visit was:   Lab Results  Component Value Date   CHOL 173 03/04/2015   HDL 40* 03/04/2015   LDLCALC 100 03/04/2015   TRIG 164* 03/04/2015   CHOLHDL 4.3 03/04/2015   She has been working on diet and exercise for prediabetes,  and denies paresthesia of the feet, polydipsia, polyuria and visual disturbances. Last A1C in the office was:  Lab Results  Component Value Date   HGBA1C  5.7* 03/04/2015   Patient is on Vitamin D supplement.   Lab Results  Component Value Date   VD25OH 20* 03/04/2015     Has not been having as many headaches with eating better and more consistently.  BMI is Body mass index is 40.9 kg/(m^2)., she is working on diet and exercise. Her weight is down 9 lbs from last visit, she is on the contrave x 3 months and doing well with weight loss, down 15 lbs total  Wt Readings from Last 3 Encounters:  07/09/15 220 lb (99.791 kg)  05/28/15 225 lb (102.059 kg)  03/04/15 229 lb (103.874 kg)   Current Medications:  Current Outpatient Prescriptions on File Prior to Visit  Medication Sig Dispense Refill  . hydrochlorothiazide (MICROZIDE) 12.5 MG capsule Take 1 capsule (12.5 mg total) by mouth daily. 30 capsule 3  . ibuprofen (ADVIL,MOTRIN) 200 MG tablet Take 600 mg by mouth every 6 (six) hours as needed for mild pain.    . Multiple Vitamins-Minerals (MULTIVITAMIN GUMMIES ADULT PO) Take by mouth. DAILY    . Naltrexone-Bupropion HCl ER (CONTRAVE) 8-90 MG TB12 2 pills twice a day 120 tablet 0  . SUMAtriptan (IMITREX) 100 MG tablet Take 1 tablet (100 mg total) by mouth once as needed for migraine. May repeat in 2 hours if headache persists or recurs. 6 tablet 2   No current facility-administered medications on file prior to visit.   Health Maintenance:   Immunization History  Administered Date(s) Administered  . Tdap 01/10/2011    Tetanus: 2012 Pneumovax: N/A Prevnar 13: N/A Flu vaccine: declines Zostavax:N/A LMP: s/p ablation Pap: 2011 DUE, follows with Dr. Mancel Bale.  MGM: Will get in Oct, DUE DEXA:  N/A Colonoscopy: N/A EGD: N/A MRI brain 2006 [place] AB 2015  Patient Care Team: Unk Pinto, MD as PCP - General (Internal Medicine) Everett Graff, MD as Consulting Physician (Obstetrics and Gynecology)  Allergies: No Known Allergies Medical History:  Past Medical History  Diagnosis Date  . Abnormal Pap smear   . Cervical polyp   .  Headache(784.0)   . Toxemia in pregnancy   . Depression   . Migraines   . Vitamin D deficiency   . Morbid obesity ([place])   . Prediabetes   . Umbilical hernia     SOME ABDOMINAL DISCOMFORT  . UTI (urinary tract infection)     ON MEDICATION AS OF 6/10 FOR UTI - HX OF FREQUENT UTI'S   Surgical History:  Past Surgical History  Procedure Laterality Date  . Cryotherapy    . Endometrial ablation    . Tubal ligation    . Wisdom tooth extraction    . Cesarean section    . Umbilical hernia repair N/A 12/27/2013    Procedure: HERNIA REPAIR UMBILICAL ADULT;  Surgeon: Harl Bowie, MD;  Location: [facility];  Service: General;  Laterality: N/A;   Family History:  Family History  Problem Relation Age of Onset  . Anesthesia problems Neg Hx   . Hypotension Neg Hx   . Malignant hyperthermia Neg Hx   . Pseudochol deficiency Neg Hx   . Hypertension Mother   . Arthritis Mother   . Hypertension Brother   . Asthma Daughter    Social History:  Social History  Substance Use Topics  . Smoking status: Never Smoker   . Smokeless tobacco: Never Used  . Alcohol Use: Yes     Comment: occasionally    Review of Systems: Review of Systems  Constitutional: Negative.   HENT: Negative.   Eyes: Negative.   Respiratory: Negative.   Cardiovascular: Negative.   Gastrointestinal: Negative.   Genitourinary: Negative.   Musculoskeletal: Negative.   Skin: Negative.   Neurological: Negative.   Endo/Heme/Allergies: Negative.   Psychiatric/Behavioral: Negative.     Physical Exam: Estimated body mass index is 40.9 kg/(m^2) as calculated from the following:   Height as of this encounter: 5' 1.5" (1.562 m).   Weight as of this encounter: 220 lb (99.791 kg). BP 118/82 mmHg  Pulse 100  Temp(Src) 97.3 F (36.3 C) (Temporal)  Resp 16  Ht 5' 1.5" (1.562 m)  Wt 220 lb (99.791 kg)  BMI 40.90 kg/m2  SpO2 99%   General Appearance: Well nourished, in no apparent distress.  Eyes: PERRLA, EOMs,  conjunctiva no swelling or erythema, normal fundi and vessels.  Sinuses: No Frontal/maxillary tenderness  ENT/Mouth: Ext aud canals clear, normal light reflex with TMs without erythema, bulging. Good dentition. No erythema, swelling, or exudate on post pharynx. Tonsils not swollen or erythematous. Hearing normal.  Neck: Supple, thyroid normal. No bruits  Respiratory: Respiratory effort normal, BS equal bilaterally without rales, rhonchi, wheezing or stridor.  Cardio: RRR without murmurs, rubs or gallops. Brisk peripheral pulses without edema.  Chest: symmetric, with normal excursions and percussion.  Breasts: defer OB/GYN Abdomen: Soft, nontender, no guarding, rebound, hernias, masses, or organomegaly.  Lymphatics: Non tender without lymphadenopathy.  Genitourinary: defer OB/GYN Musculoskeletal: Full ROM all peripheral extremities,5/5 strength, and normal gait.  Skin: Warm, dry without rashes, lesions, ecchymosis. Neuro: Cranial nerves intact, reflexes equal bilaterally. Normal muscle tone, no cerebellar symptoms. Sensation intact.  Psych: Awake and oriented X 3, normal affect, Insight and Judgment appropriate.   EKG:  declines, get next year AORTA SCAN: defer  Vicie Mutters 9:59 AM [facility]

## 2015-07-09 NOTE — Patient Instructions (Addendum)
Call Dr. Mancel Bale for your PAP Phone: [phone];   Preventive Care for Adults  A healthy lifestyle and preventive care can promote health and wellness. Preventive health guidelines for women include the following key practices.  A routine yearly physical is a good way to check with your health care provider about your health and preventive screening. It is a chance to share any concerns and updates on your health and to receive a thorough exam.  Visit your dentist for a routine exam and preventive care every 6 months. Brush your teeth twice a day and floss once a day. Good oral hygiene prevents tooth decay and gum disease.  The frequency of eye exams is based on your age, health, family medical history, use of contact lenses, and other factors. Follow your health care provider's recommendations for frequency of eye exams.  Eat a healthy diet. Foods like vegetables, fruits, whole grains, low-fat dairy products, and lean protein foods contain the nutrients you need without too many calories. Decrease your intake of foods high in solid fats, added sugars, and salt. Eat the right amount of calories for you.Get information about a proper diet from your health care provider, if necessary.  Regular physical exercise is one of the most important things you can do for your health. Most adults should get at least 150 minutes of moderate-intensity exercise (any activity that increases your heart rate and causes you to sweat) each week. In addition, most adults need muscle-strengthening exercises on 2 or more days a week.  Maintain a healthy weight. The body mass index (BMI) is a screening tool to identify possible weight problems. It provides an estimate of body fat based on height and weight. Your health care provider can find your BMI and can help you achieve or maintain a healthy weight.For adults 20 years and older:  A BMI below 18.5 is considered underweight.  A BMI of 18.5 to 24.9 is  normal.  A BMI of 25 to 29.9 is considered overweight.  A BMI of 30 and above is considered obese.  Maintain normal blood lipids and cholesterol levels by exercising and minimizing your intake of saturated fat. Eat a balanced diet with plenty of fruit and vegetables. Blood tests for lipids and cholesterol should begin at age 56 and be repeated every 5 years. If your lipid or cholesterol levels are high, you are over 50, or you are at high risk for heart disease, you may need your cholesterol levels checked more frequently.Ongoing high lipid and cholesterol levels should be treated with medicines if diet and exercise are not working.  If you smoke, find out from your health care provider how to quit. If you do not use tobacco, do not start.  Lung cancer screening is recommended for adults aged 85-80 years who are at high risk for developing lung cancer because of a history of smoking. A yearly low-dose CT scan of the lungs is recommended for people who have at least a 30-pack-year history of smoking and are a current smoker or have quit within the past 15 years. A pack year of smoking is smoking an average of 1 pack of cigarettes a day for 1 year (for example: 1 pack a day for 30 years or 2 packs a day for 15 years). Yearly screening should continue until the smoker has stopped smoking for at least 15 years. Yearly screening should be stopped for people who develop a health problem that would prevent them from having lung cancer treatment.  High  blood pressure causes heart disease and increases the risk of stroke. Your blood pressure should be checked at least every 1 to 2 years. Ongoing high blood pressure should be treated with medicines if weight loss and exercise do not work.  If you are 62-47 years old, ask your health care provider if you should take aspirin to prevent strokes.  Diabetes screening involves taking a blood sample to check your fasting blood sugar level. This should be done once  every 3 years, after age 49, if you are within normal weight and without risk factors for diabetes. Testing should be considered at a younger age or be carried out more frequently if you are overweight and have at least 1 risk factor for diabetes.  Breast cancer screening is essential preventive care for women. You should practice "breast self-awareness." This means understanding the normal appearance and feel of your breasts and may include breast self-examination. Any changes detected, no matter how small, should be reported to a health care provider. Women in their 62s and 30s should have a clinical breast exam (CBE) by a health care provider as part of a regular health exam every 1 to 3 years. After age 28, women should have a CBE every year. Starting at age 3, women should consider having a mammogram (breast X-ray test) every year. Women who have a family history of breast cancer should talk to their health care provider about genetic screening. Women at a high risk of breast cancer should talk to their health care providers about having an MRI and a mammogram every year.  Breast cancer gene (BRCA)-related cancer risk assessment is recommended for women who have family members with BRCA-related cancers. BRCA-related cancers include breast, ovarian, tubal, and peritoneal cancers. Having family members with these cancers may be associated with an increased risk for harmful changes (mutations) in the breast cancer genes BRCA1 and BRCA2. Results of the assessment will determine the need for genetic counseling and BRCA1 and BRCA2 testing.  Routine pelvic exams to screen for cancer are no longer recommended for nonpregnant women who are considered low risk for cancer of the pelvic organs (ovaries, uterus, and vagina) and who do not have symptoms. Ask your health care provider if a screening pelvic exam is right for you.  If you have had past treatment for cervical cancer or a condition that could lead to  cancer, you need Pap tests and screening for cancer for at least 20 years after your treatment. If Pap tests have been discontinued, your risk factors (such as having a new sexual partner) need to be reassessed to determine if screening should be resumed. Some women have medical problems that increase the chance of getting cervical cancer. In these cases, your health care provider may recommend more frequent screening and Pap tests.  Colorectal cancer can be detected and often prevented. Most routine colorectal cancer screening begins at the age of 72 years and continues through age 80 years. However, your health care provider may recommend screening at an earlier age if you have risk factors for colon cancer. On a yearly basis, your health care provider may provide home test kits to check for hidden blood in the stool. Use of a small camera at the end of a tube, to directly examine the colon (sigmoidoscopy or colonoscopy), can detect the earliest forms of colorectal cancer. Talk to your health care provider about this at age 94, when routine screening begins. Direct exam of the colon should be repeated every 5-10  years through age 13 years, unless early forms of pre-cancerous polyps or small growths are found.  Hepatitis C blood testing is recommended for all people born from [birth year] through 1965 and any individual with known risks for hepatitis C.  Pra  Osteoporosis is a disease in which the bones lose minerals and strength with aging. This can result in serious bone fractures or breaks. The risk of osteoporosis can be identified using a bone density scan. Women ages 83 years and over and women at risk for fractures or osteoporosis should discuss screening with their health care providers. Ask your health care provider whether you should take a calcium supplement or vitamin D to reduce the rate of osteoporosis.  Menopause can be associated with physical symptoms and risks. Hormone replacement therapy is  available to decrease symptoms and risks. You should talk to your health care provider about whether hormone replacement therapy is right for you.  Use sunscreen. Apply sunscreen liberally and repeatedly throughout the day. You should seek shade when your shadow is shorter than you. Protect yourself by wearing long sleeves, pants, a wide-brimmed hat, and sunglasses year round, whenever you are outdoors.  Once a month, do a whole body skin exam, using a mirror to look at the skin on your back. Tell your health care provider of new moles, moles that have irregular borders, moles that are larger than a pencil eraser, or moles that have changed in shape or color.  Stay current with required vaccines (immunizations).  Influenza vaccine. All adults should be immunized every year.  Tetanus, diphtheria, and acellular pertussis (Td, Tdap) vaccine. Pregnant women should receive 1 dose of Tdap vaccine during each pregnancy. The dose should be obtained regardless of the length of time since the last dose. Immunization is preferred during the 27th-36th week of gestation. An adult who has not previously received Tdap or who does not know her vaccine status should receive 1 dose of Tdap. This initial dose should be followed by tetanus and diphtheria toxoids (Td) booster doses every 10 years. Adults with an unknown or incomplete history of completing a 3-dose immunization series with Td-containing vaccines should begin or complete a primary immunization series including a Tdap dose. Adults should receive a Td booster every 10 years.  Varicella vaccine. An adult without evidence of immunity to varicella should receive 2 doses or a second dose if she has previously received 1 dose. Pregnant females who do not have evidence of immunity should receive the first dose after pregnancy. This first dose should be obtained before leaving the health care facility. The second dose should be obtained 4-8 weeks after the first  dose.  Human papillomavirus (HPV) vaccine. Females aged 13-26 years who have not received the vaccine previously should obtain the 3-dose series. The vaccine is not recommended for use in pregnant females. However, pregnancy testing is not needed before receiving a dose. If a female is found to be pregnant after receiving a dose, no treatment is needed. In that case, the remaining doses should be delayed until after the pregnancy. Immunization is recommended for any person with an immunocompromised condition through the age of 45 years if she did not get any or all doses earlier. During the 3-dose series, the second dose should be obtained 4-8 weeks after the first dose. The third dose should be obtained 24 weeks after the first dose and 16 weeks after the second dose.  Zoster vaccine. One dose is recommended for adults aged 48 years or older  unless certain conditions are present.  Measles, mumps, and rubella (MMR) vaccine. Adults born before 50 generally are considered immune to measles and mumps. Adults born in [birth year] or later should have 1 or more doses of MMR vaccine unless there is a contraindication to the vaccine or there is laboratory evidence of immunity to each of the three diseases. A routine second dose of MMR vaccine should be obtained at least 28 days after the first dose for students attending postsecondary schools, health care workers, or international travelers. People who received inactivated measles vaccine or an unknown type of measles vaccine during 1963-1967 should receive 2 doses of MMR vaccine. People who received inactivated mumps vaccine or an unknown type of mumps vaccine before 1979 and are at high risk for mumps infection should consider immunization with 2 doses of MMR vaccine. For females of childbearing age, rubella immunity should be determined. If there is no evidence of immunity, females who are not pregnant should be vaccinated. If there is no evidence of immunity, females  who are pregnant should delay immunization until after pregnancy. Unvaccinated health care workers born before 18 who lack laboratory evidence of measles, mumps, or rubella immunity or laboratory confirmation of disease should consider measles and mumps immunization with 2 doses of MMR vaccine or rubella immunization with 1 dose of MMR vaccine.  Pneumococcal 13-valent conjugate (PCV13) vaccine. When indicated, a person who is uncertain of her immunization history and has no record of immunization should receive the PCV13 vaccine. An adult aged 42 years or older who has certain medical conditions and has not been previously immunized should receive 1 dose of PCV13 vaccine. This PCV13 should be followed with a dose of pneumococcal polysaccharide (PPSV23) vaccine. The PPSV23 vaccine dose should be obtained at least 8 weeks after the dose of PCV13 vaccine. An adult aged 80 years or older who has certain medical conditions and previously received 1 or more doses of PPSV23 vaccine should receive 1 dose of PCV13. The PCV13 vaccine dose should be obtained 1 or more years after the last PPSV23 vaccine dose.    Pneumococcal polysaccharide (PPSV23) vaccine. When PCV13 is also indicated, PCV13 should be obtained first. All adults aged 53 years and older should be immunized. An adult younger than age 6 years who has certain medical conditions should be immunized. Any person who resides in a nursing home or long-term care facility should be immunized. An adult smoker should be immunized. People with an immunocompromised condition and certain other conditions should receive both PCV13 and PPSV23 vaccines. People with human immunodeficiency virus (HIV) infection should be immunized as soon as possible after diagnosis. Immunization during chemotherapy or radiation therapy should be avoided. Routine use of PPSV23 vaccine is not recommended for American Indians, [place] Natives, or people younger than 65 years unless there are  medical conditions that require PPSV23 vaccine. When indicated, people who have unknown immunization and have no record of immunization should receive PPSV23 vaccine. One-time revaccination 5 years after the first dose of PPSV23 is recommended for people aged 19-64 years who have chronic kidney failure, nephrotic syndrome, asplenia, or immunocompromised conditions. People who received 1-2 doses of PPSV23 before age 60 years should receive another dose of PPSV23 vaccine at age 12 years or later if at least 5 years have passed since the previous dose. Doses of PPSV23 are not needed for people immunized with PPSV23 at or after age 5 years.  Preventive Services / Frequency   Ages 34 to 59 years  Blood pressure check.  Lipid and cholesterol check.  Lung cancer screening. / Every year if you are aged 29-80 years and have a 30-pack-year history of smoking and currently smoke or have quit within the past 15 years. Yearly screening is stopped once you have quit smoking for at least 15 years or develop a health problem that would prevent you from having lung cancer treatment.  Clinical breast exam.** / Every year after age 52 years.  BRCA-related cancer risk assessment.** / For women who have family members with a BRCA-related cancer (breast, ovarian, tubal, or peritoneal cancers).  Mammogram.** / Every year beginning at age 46 years and continuing for as long as you are in good health. Consult with your health care provider.  Pap test.** / Every 3 years starting at age 65 years through age 30 or 78 years with a history of 3 consecutive normal Pap tests.  HPV screening.** / Every 3 years from ages 22 years through ages 66 to 7 years with a history of 3 consecutive normal Pap tests.  Fecal occult blood test (FOBT) of stool. / Every year beginning at age 7 years and continuing until age 56 years. You may not need to do this test if you get a colonoscopy every 10 years.  Flexible sigmoidoscopy or  colonoscopy.** / Every 5 years for a flexible sigmoidoscopy or every 10 years for a colonoscopy beginning at age 57 years and continuing until age 48 years.  Hepatitis C blood test.** / For all people born from [birth year] through 1965 and any individual with known risks for hepatitis C.  Skin self-exam. / Monthly.  Influenza vaccine. / Every year.  Tetanus, diphtheria, and acellular pertussis (Tdap/Td) vaccine.** / Consult your health care provider. Pregnant women should receive 1 dose of Tdap vaccine during each pregnancy. 1 dose of Td every 10 years.  Varicella vaccine.** / Consult your health care provider. Pregnant females who do not have evidence of immunity should receive the first dose after pregnancy.  Zoster vaccine.** / 1 dose for adults aged 29 years or older.  Pneumococcal 13-valent conjugate (PCV13) vaccine.** / Consult your health care provider.  Pneumococcal polysaccharide (PPSV23) vaccine.** / 1 to 2 doses if you smoke cigarettes or if you have certain conditions.  Meningococcal vaccine.** / Consult your health care provider.  Hepatitis A vaccine.** / Consult your health care provider.  Hepatitis B vaccine.** / Consult your health care provider. Screening for abdominal aortic aneurysm (AAA)  by ultrasound is recommended for people over 50 who have history of high blood pressure or who are current or former smokers.

## 2015-07-10 ENCOUNTER — Ambulatory Visit: Payer: Self-pay | Admitting: Physician Assistant

## 2015-07-10 LAB — HEMOGLOBIN A1C
Hgb A1c MFr Bld: 5.6 % (ref <5.7)
MEAN PLASMA GLUCOSE: 114 mg/dL (ref <117)

## 2015-07-10 LAB — VITAMIN D 25 HYDROXY (VIT D DEFICIENCY, FRACTURES): VIT D 25 HYDROXY: 13 ng/mL — AB (ref 30–100)

## 2015-10-22 ENCOUNTER — Ambulatory Visit (INDEPENDENT_AMBULATORY_CARE_PROVIDER_SITE_OTHER): Payer: BC Managed Care – PPO | Admitting: Physician Assistant

## 2015-10-22 ENCOUNTER — Encounter: Payer: Self-pay | Admitting: Physician Assistant

## 2015-10-22 VITALS — BP 118/64 | HR 98 | Temp 97.6°F | Resp 16 | Ht 61.5 in | Wt 220.0 lb

## 2015-10-22 DIAGNOSIS — I10 Essential (primary) hypertension: Secondary | ICD-10-CM

## 2015-10-22 DIAGNOSIS — R7303 Prediabetes: Secondary | ICD-10-CM

## 2015-10-22 DIAGNOSIS — E559 Vitamin D deficiency, unspecified: Secondary | ICD-10-CM | POA: Diagnosis not present

## 2015-10-22 DIAGNOSIS — Z79899 Other long term (current) drug therapy: Secondary | ICD-10-CM | POA: Diagnosis not present

## 2015-10-22 NOTE — Patient Instructions (Addendum)
Get on 5000 Vitamin D   Vitamin D goal is between 60-80  Please make sure that you are taking your Vitamin D as directed.   It is very important as a natural anti-inflammatory   helping hair, skin, and nails, as well as reducing stroke and heart attack risk.   It helps your bones and helps with mood.  It also decreases numerous cancer risks so please take it as directed.   Low Vit D is associated with a 200-300% higher risk for CANCER   and 200-300% higher risk for HEART   ATTACK  &  STROKE.    .....................................Marland Kitchen  It is also associated with higher death rate at younger ages,   autoimmune diseases like Rheumatoid arthritis, Lupus, Multiple Sclerosis.     Also many other serious conditions, like depression, Alzheimer's  Dementia, infertility, muscle aches, fatigue, fibromyalgia - just to name a few.  +++++++++++++++++++  Remember exercise is great for your cardiovascular health and can help with weight loss but YOU CAN NOT OUT RUN YOUR FORK!

## 2015-10-22 NOTE — Progress Notes (Signed)
Assessment and Plan:  1. Hypertension -Continue medication, monitor blood pressure at home. Continue DASH diet.  Reminder to go to the ER if any CP, SOB, nausea, dizziness, severe HA, changes vision/speech, left arm numbness and tingling and jaw pain.  2. Cholesterol -Continue diet and exercise. Check cholesterol.   3. Prediabetes  -Continue diet and exercise. Check A1C  4. Vitamin D Def - check level and continue medications.   5. Obesity with co morbidities  long discussion about weight loss, diet, and exercise  continue contrave for now  Continue diet and meds as discussed. Further disposition pending results of labs. Over 30 minutes of exam, counseling, chart review, and critical decision making was performed No future appointments.  HPI 40 y.o. female  presents for 3 month follow up on hypertension, cholesterol, prediabetes, and vitamin D deficiency.   Her blood pressure has been controlled at home, today their BP is BP: 118/64 mmHg  She does workout, has been walking. She denies chest pain, shortness of breath, dizziness.  She is not on cholesterol medication and denies myalgias. Her cholesterol is at goal. The cholesterol last visit was:   Lab Results  Component Value Date   CHOL 161 07/09/2015   HDL 44* 07/09/2015   LDLCALC 98 07/09/2015   TRIG 95 07/09/2015   CHOLHDL 3.7 07/09/2015    She has been working on diet and exercise for prediabetes, and denies paresthesia of the feet, polydipsia, polyuria and visual disturbances. Last A1C in the office was:  Lab Results  Component Value Date   HGBA1C 5.6 07/09/2015  Patient is NOT on Vitamin D supplement.   Lab Results  Component Value Date   VD25OH 13* 07/09/2015  BMI is Body mass index is 40.9 kg/(m^2)., she is working on diet and exercise. Has been on contrave, lost 15 lbs, has been on it for 1 year.  Wt Readings from Last 3 Encounters:  10/22/15 220 lb (99.791 kg)  07/09/15 220 lb (99.791 kg)  05/28/15 225 lb  (102.059 kg)    Current Medications:  Current Outpatient Prescriptions on File Prior to Visit  Medication Sig Dispense Refill  . hydrochlorothiazide (MICROZIDE) 12.5 MG capsule Take 1 capsule (12.5 mg total) by mouth daily. 30 capsule 3  . ibuprofen (ADVIL,MOTRIN) 200 MG tablet Take 600 mg by mouth every 6 (six) hours as needed for mild pain.    . Multiple Vitamins-Minerals (MULTIVITAMIN GUMMIES ADULT PO) Take by mouth. DAILY    . Naltrexone-Bupropion HCl ER (CONTRAVE) 8-90 MG TB12 2 pills twice a day 120 tablet 3  . SUMAtriptan (IMITREX) 100 MG tablet Take 1 tablet (100 mg total) by mouth once as needed for migraine. May repeat in 2 hours if headache persists or recurs. 6 tablet 2   No current facility-administered medications on file prior to visit.   Medical History:  Past Medical History  Diagnosis Date  . Abnormal Pap smear   . Cervical polyp   . Headache(784.0)   . Toxemia in pregnancy   . Depression   . Migraines   . Vitamin D deficiency   . Morbid obesity ([place])   . Prediabetes   . Umbilical hernia     SOME ABDOMINAL DISCOMFORT  . UTI (urinary tract infection)     ON MEDICATION AS OF 6/10 FOR UTI - HX OF FREQUENT UTI'S   Allergies: No Known Allergies   Review of Systems:  Review of Systems  Constitutional: Negative.   HENT: Negative.   Eyes: Negative.  Respiratory: Negative.   Cardiovascular: Negative.   Gastrointestinal: Negative.   Genitourinary: Negative.   Musculoskeletal: Negative.   Skin: Negative.   Neurological: Negative.   Endo/Heme/Allergies: Negative.   Psychiatric/Behavioral: Negative.     Family history- Review and unchanged Social history- Review and unchanged Physical Exam: BP 118/64 mmHg  Pulse 98  Temp(Src) 97.6 F (36.4 C) (Temporal)  Resp 16  Ht 5' 1.5" (1.562 m)  Wt 220 lb (99.791 kg)  BMI 40.90 kg/m2  SpO2 99% Wt Readings from Last 3 Encounters:  10/22/15 220 lb (99.791 kg)  07/09/15 220 lb (99.791 kg)  05/28/15 225 lb  (102.059 kg)   General Appearance: Well nourished, in no apparent distress. Eyes: PERRLA, EOMs, conjunctiva no swelling or erythema Sinuses: No Frontal/maxillary tenderness ENT/Mouth: Ext aud canals clear, TMs without erythema, bulging. No erythema, swelling, or exudate on post pharynx.  Tonsils not swollen or erythematous. Hearing normal.  Neck: Supple, thyroid normal.  Respiratory: Respiratory effort normal, BS equal bilaterally without rales, rhonchi, wheezing or stridor.  Cardio: RRR with no MRGs. Brisk peripheral pulses without edema.  Abdomen: Soft, + BS,  Non tender, no guarding, rebound, hernias, masses. Lymphatics: Non tender without lymphadenopathy.  Musculoskeletal: Full ROM, 5/5 strength, Normal gait Skin: Warm, dry without rashes, lesions, ecchymosis.  Neuro: Cranial nerves intact. Normal muscle tone, no cerebellar symptoms. Psych: Awake and oriented X 3, normal affect, Insight and Judgment appropriate.    Vicie Mutters, PA-C 9:50 AM [facility]

## 2015-11-22 ENCOUNTER — Emergency Department (HOSPITAL_COMMUNITY): Payer: BC Managed Care – PPO

## 2015-11-22 ENCOUNTER — Emergency Department (HOSPITAL_COMMUNITY)
Admission: EM | Admit: 2015-11-22 | Discharge: 2015-11-22 | Disposition: A | Discharge status: Home / Self Care | Payer: BC Managed Care – PPO | Attending: Emergency Medicine | Admitting: Emergency Medicine

## 2015-11-22 ENCOUNTER — Encounter (HOSPITAL_COMMUNITY): Payer: Self-pay | Admitting: Emergency Medicine

## 2015-11-22 DIAGNOSIS — R0789 Other chest pain: Secondary | ICD-10-CM | POA: Diagnosis not present

## 2015-11-22 DIAGNOSIS — Z791 Long term (current) use of non-steroidal anti-inflammatories (NSAID): Secondary | ICD-10-CM | POA: Diagnosis not present

## 2015-11-22 DIAGNOSIS — Z79899 Other long term (current) drug therapy: Secondary | ICD-10-CM | POA: Insufficient documentation

## 2015-11-22 DIAGNOSIS — F329 Major depressive disorder, single episode, unspecified: Secondary | ICD-10-CM | POA: Diagnosis not present

## 2015-11-22 MED ORDER — NAPROXEN 500 MG PO TABS: 500.0000 mg | ORAL_TABLET | Freq: Two times a day (BID) | ORAL | Status: DC

## 2015-11-22 MED ORDER — METHOCARBAMOL 500 MG PO TABS: 500.0000 mg | ORAL_TABLET | Freq: Two times a day (BID) | ORAL | Status: DC
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
## 2015-11-25 ENCOUNTER — Ambulatory Visit (INDEPENDENT_AMBULATORY_CARE_PROVIDER_SITE_OTHER): Payer: BC Managed Care – PPO | Admitting: Internal Medicine

## 2015-11-25 ENCOUNTER — Encounter: Payer: Self-pay | Admitting: Internal Medicine

## 2015-11-25 DIAGNOSIS — M940 Chondrocostal junction syndrome [Tietze]: Secondary | ICD-10-CM

## 2015-11-25 MED ORDER — METHOCARBAMOL 500 MG PO TABS: 500.0000 mg | ORAL_TABLET | Freq: Three times a day (TID) | ORAL | Status: DC | PRN

## 2015-11-25 MED ORDER — PREDNISONE 20 MG PO TABS
ORAL_TABLET | ORAL | Status: DC
Start: 2015-11-25 — End: 2016-03-23

## 2015-11-25 NOTE — Patient Instructions (Signed)
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
## 2016-02-26 ENCOUNTER — Ambulatory Visit: Payer: Self-pay | Admitting: Physician Assistant

## 2016-03-18 ENCOUNTER — Ambulatory Visit: Payer: Self-pay | Admitting: Physician Assistant

## 2016-03-23 ENCOUNTER — Ambulatory Visit (INDEPENDENT_AMBULATORY_CARE_PROVIDER_SITE_OTHER): Payer: BC Managed Care – PPO | Admitting: Physician Assistant

## 2016-03-23 ENCOUNTER — Encounter: Payer: Self-pay | Admitting: Physician Assistant

## 2016-03-23 VITALS — BP 130/80 | HR 73 | Temp 97.6°F | Resp 16 | Ht 61.5 in | Wt 229.4 lb

## 2016-03-23 DIAGNOSIS — E559 Vitamin D deficiency, unspecified: Secondary | ICD-10-CM

## 2016-03-23 DIAGNOSIS — R7303 Prediabetes: Secondary | ICD-10-CM | POA: Diagnosis not present

## 2016-03-23 DIAGNOSIS — Z79899 Other long term (current) drug therapy: Secondary | ICD-10-CM | POA: Diagnosis not present

## 2016-03-23 DIAGNOSIS — I10 Essential (primary) hypertension: Secondary | ICD-10-CM

## 2016-03-23 LAB — BASIC METABOLIC PANEL WITH GFR
BUN: 7 mg/dL (ref 7–25)
CALCIUM: 9 mg/dL (ref 8.6–10.2)
CO2: 24 mmol/L (ref 20–31)
Chloride: 105 mmol/L (ref 98–110)
Creat: 0.7 mg/dL (ref 0.50–1.10)
Glucose, Bld: 91 mg/dL (ref 65–99)
Potassium: 4 mmol/L (ref 3.5–5.3)
SODIUM: 138 mmol/L (ref 135–146)

## 2016-03-23 LAB — HEPATIC FUNCTION PANEL
ALT: 18 U/L (ref 6–29)
AST: 18 U/L (ref 10–30)
Albumin: 3.8 g/dL (ref 3.6–5.1)
Alkaline Phosphatase: 58 U/L (ref 33–115)
BILIRUBIN DIRECT: 0.1 mg/dL (ref <=0.2)
BILIRUBIN INDIRECT: 0.4 mg/dL (ref 0.2–1.2)
BILIRUBIN TOTAL: 0.5 mg/dL (ref 0.2–1.2)
Total Protein: 6.8 g/dL (ref 6.1–8.1)

## 2016-03-23 LAB — CBC WITH DIFFERENTIAL/PLATELET
Basophils Absolute: 0 cells/uL (ref 0–200)
Basophils Relative: 0 %
EOS PCT: 1 %
Eosinophils Absolute: 88 cells/uL (ref 15–500)
HCT: 37 % (ref 35.0–45.0)
HEMOGLOBIN: 12.2 g/dL (ref 11.7–15.5)
LYMPHS ABS: 1936 {cells}/uL (ref 850–3900)
LYMPHS PCT: 22 %
MCH: 26.7 pg — ABNORMAL LOW (ref 27.0–33.0)
MCHC: 33 g/dL (ref 32.0–36.0)
MCV: 81 fL (ref 80.0–100.0)
MONOS PCT: 7 %
MPV: 9 fL (ref 7.5–12.5)
Monocytes Absolute: 616 cells/uL (ref 200–950)
NEUTROS PCT: 70 %
Neutro Abs: 6160 cells/uL (ref 1500–7800)
PLATELETS: 275 10*3/uL (ref 140–400)
RBC: 4.57 MIL/uL (ref 3.80–5.10)
RDW: 13.8 % (ref 11.0–15.0)
WBC: 8.8 10*3/uL (ref 3.8–10.8)

## 2016-03-23 MED ORDER — NALTREXONE-BUPROPION HCL ER 8-90 MG PO TB12: ORAL_TABLET | ORAL | 3 refills | Status: DC

## 2016-03-23 MED ORDER — HYDROCHLOROTHIAZIDE 12.5 MG PO CAPS
12.5000 mg | ORAL_CAPSULE | Freq: Every day | ORAL | 3 refills | Status: DC
Start: 2016-03-23 — End: 2017-02-23

## 2016-03-23 NOTE — Patient Instructions (Addendum)
Here is a number for you to call if you are considering bariatric surgery as a treatment for your obesity. This is an effective treatment for weight loss however it too is not a simple fix. The surgery can have complications during and after, and on average patients gain back the weight 10-15 years after the surgery if they do not change their eating habits. Please call [phone] to learn more and to register for a seminar.  Check out [facility] weight loss  Ways to cut 100 calories  1. Eat your eggs with hot sauce OR salsa instead of cheese.  Eggs are great for breakfast, but many people consider eggs and cheese to be BFFs. Instead of cheese-1 oz. of cheddar has 114 calories-top your eggs with hot sauce, which contains no calories and helps with satiety and metabolism. Salsa is also a great option!!  2. Top your toast, waffles or pancakes with mashed berries instead of jelly or syrup. Half a cup of berries-fresh, frozen or thawed-has about 40 calories, compared with 2 tbsp. of maple syrup or jelly, which both have about 100 calories. The berries will also give you a good punch of fiber, which helps keep you full and satisfied and won't spike blood sugar quickly like the jelly or syrup. 3. Swap the non-fat latte for black coffee with a splash of half-and-half. Contrary to its name, that non-fat latte has 130 calories and a startling 19g of carbohydrates per 16 oz. serving. Replacing that 'light' drinkable dessert with a black coffee with a splash of half-and-half saves you more than 100 calories per 16 oz. serving. 4. Sprinkle salads with freeze-dried raspberries instead of dried cranberries. If you want a sweet addition to your nutritious salad, stay away from dried cranberries. They have a whopping 130 calories per  cup and 30g carbohydrates. Instead, sprinkle freeze-dried raspberries guilt-free and save more than 100 calories per  cup serving, adding 3g of belly-filling fiber. 5. Go for  mustard in place of mayo on your sandwich. Mustard can add really nice flavor to any sandwich, and there are tons of varieties, from spicy to honey. A serving of mayo is 95 calories, versus 10 calories in a serving of mustard. 6. Choose a DIY salad dressing instead of the store-bought kind. Mix Dijon or whole grain mustard with low-fat Kefir or red wine vinegar and garlic. 7. Use hummus as a spread instead of a dip. Use hummus as a spread on a high-fiber cracker or tortilla with a sandwich and save on calories without sacrificing taste. 8. Pick just one salad "accessory." Salad isn't automatically a calorie winner. It's easy to over-accessorize with toppings. Instead of topping your salad with nuts, avocado and cranberries (all three will clock in at 313 calories), just pick one. The next day, choose a different accessory, which will also keep your salad interesting. You don't wear all your jewelry every day, right? 9. Ditch the white pasta in favor of spaghetti squash. One cup of cooked spaghetti squash has about 40 calories, compared with traditional spaghetti, which comes with more than 200. Spaghetti squash is also nutrient-dense. It's a good source of fiber and Vitamins A and C, and it can be eaten just like you would eat pasta-with a great tomato sauce and [place] meatballs or with pesto, tofu and spinach, for example. 10. Dress up your chili, soups and stews with non-fat [place] yogurt instead of sour cream. Just a 'dollop' of sour cream can set you back 115 calories and  a whopping 12g of fat-seven of which are of the artery-clogging variety. Added bonus: [place] yogurt is packed with muscle-building protein, calcium and B Vitamins. 11. Mash cauliflower instead of mashed potatoes. One cup of traditional mashed potatoes-in all their creamy goodness-has more than 200 calories, compared to mashed cauliflower, which you can typically eat for less than 100 calories per 1 cup serving. Cauliflower is a great  source of the antioxidant indole-3-carbinol (I3C), which may help reduce the risk of some cancers, like breast cancer. 12. Ditch the ice cream sundae in favor of a [place] yogurt parfait. Instead of a cup of ice cream or fro-yo for dessert, try 1 cup of nonfat Greek yogurt topped with fresh berries and a sprinkle of cacao nibs. Both toppings are packed with antioxidants, which can help reduce cellular inflammation and oxidative damage. And the comparison is a no-brainer: One cup of ice cream has about 275 calories; one cup of frozen yogurt has about 230; and a cup of Greek yogurt has just 130, plus twice the protein, so you're less likely to return to the freezer for a second helping. 13. Put olive oil in a spray container instead of using it directly from the bottle. Each tablespoon of olive oil is 120 calories and 15g of fat. Use a mister instead of pouring it straight into the pan or onto a salad. This allows for portion control and will save you more than 100 calories. 14. When baking, substitute canned pumpkin for butter or oil. Canned pumpkin-not pumpkin pie mix-is loaded with Vitamin A, which is important for skin and eye health, as well as immunity. And the comparisons are pretty crazy:  cup of canned pumpkin has about 40 calories, compared to butter or oil, which has more than 800 calories. Yes, 800 calories. Applesauce and mashed banana can also serve as good substitutions for butter or oil, usually in a 1:1 ratio. 15. Top casseroles with high-fiber cereal instead of breadcrumbs. Breadcrumbs are typically made with white bread, while breakfast cereals contain 5-9g of fiber per serving. Not only will you save more than 150 calories per  cup serving, the swap will also keep you more full and you'll get a metabolism boost from the added fiber. 16. Snack on pistachios instead of macadamia nuts. Believe it or not, you get the same amount of calories from 35 pistachios (100 calories) as you would from  only five macadamia nuts. 17. Chow down on kale chips rather than potato chips. This is my favorite 'don't knock it 'till you try it' swap. Kale chips are so easy to make at home, and you can spice them up with a little grated parmesan or chili powder. Plus, they're a mere fraction of the calories of potato chips, but with the same crunch factor we crave so often. 18. Add seltzer and some fruit slices to your cocktail instead of soda or fruit juice. One cup of soda or fruit juice can pack on as much as 140 calories. Instead, use seltzer and fruit slices. The fruit provides valuable phytochemicals, such as flavonoids and anthocyanins, which help to combat cancer and stave off the aging process.
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
## 2016-03-24 LAB — HEMOGLOBIN A1C
HEMOGLOBIN A1C: 5.3 % (ref <5.7)
MEAN PLASMA GLUCOSE: 105 mg/dL

## 2016-03-24 LAB — TSH: TSH: 1.17 mIU/L

## 2016-07-26 ENCOUNTER — Ambulatory Visit (INDEPENDENT_AMBULATORY_CARE_PROVIDER_SITE_OTHER): Payer: BC Managed Care – PPO | Admitting: Physician Assistant

## 2016-07-26 ENCOUNTER — Encounter: Payer: Self-pay | Admitting: Physician Assistant

## 2016-07-26 VITALS — BP 128/74 | HR 106 | Temp 97.7°F | Resp 16 | Ht 61.5 in | Wt 230.8 lb

## 2016-07-26 DIAGNOSIS — Z23 Encounter for immunization: Secondary | ICD-10-CM

## 2016-07-26 DIAGNOSIS — R7303 Prediabetes: Secondary | ICD-10-CM | POA: Diagnosis not present

## 2016-07-26 DIAGNOSIS — Z79899 Other long term (current) drug therapy: Secondary | ICD-10-CM

## 2016-07-26 DIAGNOSIS — E559 Vitamin D deficiency, unspecified: Secondary | ICD-10-CM

## 2016-07-26 DIAGNOSIS — I10 Essential (primary) hypertension: Secondary | ICD-10-CM | POA: Diagnosis not present

## 2016-07-26 LAB — CBC WITH DIFFERENTIAL/PLATELET
Basophils Absolute: 0 cells/uL (ref 0–200)
Basophils Relative: 0 %
EOS PCT: 1 %
Eosinophils Absolute: 60 cells/uL (ref 15–500)
HCT: 37.3 % (ref 35.0–45.0)
Hemoglobin: 12 g/dL (ref 11.7–15.5)
LYMPHS PCT: 17 %
Lymphs Abs: 1020 cells/uL (ref 850–3900)
MCH: 26.3 pg — ABNORMAL LOW (ref 27.0–33.0)
MCHC: 32.2 g/dL (ref 32.0–36.0)
MCV: 81.6 fL (ref 80.0–100.0)
MONOS PCT: 13 %
MPV: 9 fL (ref 7.5–12.5)
Monocytes Absolute: 780 cells/uL (ref 200–950)
NEUTROS PCT: 69 %
Neutro Abs: 4140 cells/uL (ref 1500–7800)
PLATELETS: 271 10*3/uL (ref 140–400)
RBC: 4.57 MIL/uL (ref 3.80–5.10)
RDW: 14.6 % (ref 11.0–15.0)
WBC: 6 10*3/uL (ref 3.8–10.8)

## 2016-07-26 LAB — HEPATIC FUNCTION PANEL
ALT: 17 U/L (ref 6–29)
AST: 22 U/L (ref 10–30)
Albumin: 3.8 g/dL (ref 3.6–5.1)
Alkaline Phosphatase: 62 U/L (ref 33–115)
BILIRUBIN DIRECT: 0.1 mg/dL (ref <=0.2)
BILIRUBIN INDIRECT: 0.2 mg/dL (ref 0.2–1.2)
BILIRUBIN TOTAL: 0.3 mg/dL (ref 0.2–1.2)
TOTAL PROTEIN: 6.7 g/dL (ref 6.1–8.1)

## 2016-07-26 LAB — BASIC METABOLIC PANEL WITH GFR
BUN: 8 mg/dL (ref 7–25)
CALCIUM: 9 mg/dL (ref 8.6–10.2)
CO2: 25 mmol/L (ref 20–31)
Chloride: 108 mmol/L (ref 98–110)
Creat: 0.81 mg/dL (ref 0.50–1.10)
GLUCOSE: 88 mg/dL (ref 65–99)
Potassium: 3.9 mmol/L (ref 3.5–5.3)
SODIUM: 140 mmol/L (ref 135–146)

## 2016-07-26 LAB — TSH: TSH: 0.5 mIU/L

## 2016-07-26 MED ORDER — PHENTERMINE HCL 37.5 MG PO TABS: 37.5000 mg | ORAL_TABLET | Freq: Every day | ORAL | 2 refills | Status: DC
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
## 2016-07-26 NOTE — Addendum Note (Signed)
Addended by: Elsie Amis D on: 07/26/2016 10:28 AM   Modules accepted: Orders

## 2016-07-26 NOTE — Patient Instructions (Signed)
Food diary- my fitness pal or lose it app on your phone Increase water 100 oz a day, decrease sugary drinks Eat breakfast   Simple math prevails.    1st - exercise does not produce significant weight loss - at best one converts fat into muscle , "bulks up", loses inches, but usually stays "weight neutral"     2nd - think of your body weightas a check book: If you eat more calories than you burn up - you save money or gain weight .... Or if you spend more money than you put in the check book, ie burn up more calories than you eat, then you lose weight     3rd - if you walk or run 1 mile, you burn up 100 calories - you have to burn up 3,500 calories to lose 1 pound, ie you have to walk/run 35 miles to lose 1 measly pound. So if you want to lose 10 #, then you have to walk/run 350 miles, so.... clearly exercise is not the solution.     4. So if you consume 1,500 calories, then you have to burn up the equivalent of 15 miles to stay weight neutral - It also stands to reason that if you consume 1,500 cal/day and don't lose weight, then you must be burning up about 1,500 cals/day to stay weight neutral.     5. If you really want to lose weight, you must cut your calorie intake 300 calories /day and at that rate you should lose about 1 # every 3 days.   6. Please purchase Dr Fara Olden Fuhrman's book(s) "The End of Dieting" & "Eat to Live" . It has some great concepts and recipes.      Phentermine  While taking the medication we may ask that you come into the office once a month or once every 2-3 months to monitor your weight, blood pressure, and heart rate. In addition we can help answer your questions about diet, exercise, and help you every step of the way with your weight loss journey. Sometime it is helpful if you bring in a food diary or use an app on your phone such as myfitnesspal to record your calorie intake, especially in the beginning.   You can start out on 1/3 to 1/2 a pill in the morning  and if you are tolerating it well you can increase to one pill daily. I also have some patients that take 1/3 or 1/2 at lunch to help prevent night time eating.  This medication is cheapest CASH pay at [facility] is 14-17 dollars and you do NOT need a membership to get meds from there.    What is this medicine? PHENTERMINE (FEN ter meen) decreases your appetite. This medicine is intended to be used in addition to a healthy reduced calorie diet and exercise. The best results are achieved this way. This medicine is only indicated for short-term use. Eventually your weight loss may level out and the medication will no longer be needed.   How should I use this medicine? Take this medicine by mouth. Follow the directions on the prescription label. The tablets should stay in the bottle until immediately before you take your dose. Take your doses at regular intervals. Do not take your medicine more often than directed.  Overdosage: If you think you have taken too much of this medicine contact a poison control center or emergency room at once. NOTE: This medicine is only for you.  Do not share this medicine with others.  What if I miss a dose? If you miss a dose, take it as soon as you can. If it is almost time for your next dose, take only that dose. Do not take double or extra doses. Do not increase or in any way change your dose without consulting your doctor.  What should I watch for while using this medicine? Notify your physician immediately if you become short of breath while doing your normal activities. Do not take this medicine within 6 hours of bedtime. It can keep you from getting to sleep. Avoid drinks that contain caffeine and try to stick to a regular bedtime every night. Do not stand or sit up quickly, especially if you are an older patient. This reduces the risk of dizzy or fainting spells. Avoid alcoholic drinks.  What side effects may I notice from receiving this  medicine? Side effects that you should report to your doctor or health care professional as soon as possible: -chest pain, palpitations -depression or severe changes in mood -increased blood pressure -irritability -nervousness or restlessness -severe dizziness -shortness of breath -problems urinating -unusual swelling of the legs -vomiting  Side effects that usually do not require medical attention (report to your doctor or health care professional if they continue or are bothersome): -blurred vision or other eye problems -changes in sexual ability or desire -constipation or diarrhea -difficulty sleeping -dry mouth or unpleasant taste -headache -nausea This list may not describe all possible side effects. Call your doctor for medical advice about side effects. You may report side effects to FDA at 1-800-FDA-1088.

## 2016-07-27 LAB — VITAMIN D 25 HYDROXY (VIT D DEFICIENCY, FRACTURES): Vit D, 25-Hydroxy: 16 ng/mL — ABNORMAL LOW (ref 30–100)

## 2016-07-27 LAB — MAGNESIUM: Magnesium: 2 mg/dL (ref 1.5–2.5)

## 2016-08-07 ENCOUNTER — Encounter (HOSPITAL_COMMUNITY): Payer: Self-pay | Admitting: Family Medicine

## 2016-08-07 ENCOUNTER — Ambulatory Visit (HOSPITAL_COMMUNITY)
Admission: EM | Admit: 2016-08-07 | Discharge: 2016-08-07 | Disposition: A | Discharge status: Home / Self Care | Payer: BC Managed Care – PPO | Attending: Family Medicine | Admitting: Family Medicine

## 2016-08-07 DIAGNOSIS — S61217A Laceration without foreign body of left little finger without damage to nail, initial encounter: Secondary | ICD-10-CM | POA: Diagnosis not present

## 2016-08-07 DIAGNOSIS — Z23 Encounter for immunization: Secondary | ICD-10-CM | POA: Diagnosis not present

## 2016-08-07 MED ORDER — TETANUS-DIPHTH-ACELL PERTUSSIS 5-2.5-18.5 LF-MCG/0.5 IM SUSP
0.5000 mL | Freq: Once | INTRAMUSCULAR | Status: AC
  Administered 2016-08-07: 0.5 mL via INTRAMUSCULAR

## 2016-08-07 MED ORDER — TETANUS-DIPHTH-ACELL PERTUSSIS 5-2.5-18.5 LF-MCG/0.5 IM SUSP
INTRAMUSCULAR | Status: AC
  Filled 2016-08-07: qty 0.5

## 2016-08-07 MED ORDER — LIDOCAINE-EPINEPHRINE-TETRACAINE (LET) SOLUTION
3.0000 mL | Freq: Once | NASAL | Status: AC
  Administered 2016-08-07: 16:00:00 3 mL via TOPICAL

## 2016-08-07 MED ORDER — LIDOCAINE-EPINEPHRINE-TETRACAINE (LET) SOLUTION
NASAL | Status: AC
  Filled 2016-08-07: qty 3

## 2016-08-07 NOTE — Discharge Instructions (Signed)
Return in 1 week for suture removal.

## 2016-08-07 NOTE — ED Triage Notes (Signed)
Pt here for laceration to left pinky finger. sts cutting some flowers.

## 2016-08-07 NOTE — ED Provider Notes (Signed)
CSN: [id number]     Arrival date & time 08/07/16  1339 History   First MD Initiated Contact with Patient 08/07/16 1550     Chief Complaint  Patient presents with  . Finger Injury   (Consider location/radiation/quality/duration/timing/severity/associated sxs/prior Treatment) Denies the use of blood thinner.     Laceration  Location:  Finger Finger laceration location:  R little finger Length:  1.5 cm Depth:  Cutaneous Quality: straight   Bleeding: controlled   Time since incident:  6 hours Injury mechanism: Scissor. Pain details:    Quality:  Dull   Severity:  Mild   Timing:  Intermittent   Progression:  Unchanged Foreign body present:  No foreign bodies Relieved by:  None tried Ineffective treatments:  None tried Tetanus status:  Unknown Associated symptoms: no fever, no focal weakness, no numbness, no rash, no redness, no swelling and no streaking     Past Medical History:  Diagnosis Date  . Abnormal Pap smear   . Cervical polyp   . Depression   . Headache(784.0)   . Migraines   . Morbid obesity ([place])   . Prediabetes   . Toxemia in pregnancy   . Umbilical hernia    SOME ABDOMINAL DISCOMFORT  . UTI (urinary tract infection)    ON MEDICATION AS OF 6/10 FOR UTI - HX OF FREQUENT UTI'S  . Vitamin D deficiency    Past Surgical History:  Procedure Laterality Date  . CESAREAN SECTION    . CRYOTHERAPY    . ENDOMETRIAL ABLATION    . TUBAL LIGATION    . UMBILICAL HERNIA REPAIR N/A 12/27/2013   Procedure: HERNIA REPAIR UMBILICAL ADULT;  Surgeon: Harl Bowie, MD;  Location: [facility];  Service: General;  Laterality: N/A;  . WISDOM TOOTH EXTRACTION     Family History  Problem Relation Age of Onset  . Hypertension Mother   . Arthritis Mother   . Hypertension Brother   . Asthma Daughter   . Anesthesia problems Neg Hx   . Hypotension Neg Hx   . Malignant hyperthermia Neg Hx   . Pseudochol deficiency Neg Hx    Social History  Substance Use Topics  . Smoking  status: Never Smoker  . Smokeless tobacco: Never Used  . Alcohol use Yes     Comment: occasionally   OB History    Gravida Para Term Preterm AB Living   2 2 1 1   2    SAB TAB Ectopic Multiple Live Births                 Review of Systems  Constitutional: Negative for fever.       As stated in the HPI  Skin: Negative for rash.  Neurological: Negative for focal weakness.    Allergies  Patient has no known allergies.  Home Medications   Prior to Admission medications   Medication Sig Start Date End Date Taking? Authorizing Provider  hydrochlorothiazide (MICROZIDE) 12.5 MG capsule Take 1 capsule (12.5 mg total) by mouth daily. 03/23/16   Vicie Mutters, PA-C  ibuprofen (ADVIL,MOTRIN) 200 MG tablet Take 600 mg by mouth every 6 (six) hours as needed for mild pain.    Historical Provider, MD  methocarbamol (ROBAXIN) 500 MG tablet Take 1 tablet (500 mg total) by mouth every 8 (eight) hours as needed for muscle spasms. 11/25/15   Courtney Forcucci, PA-C  Multiple Vitamins-Minerals (MULTIVITAMIN GUMMIES ADULT PO) Take by mouth. DAILY    Historical Provider, MD  naproxen (NAPROSYN) 500 MG  tablet Take 1 tablet (500 mg total) by mouth 2 (two) times daily. 11/22/15   Hyman Bible, PA-C  phentermine (ADIPEX-P) 37.5 MG tablet Take 1 tablet (37.5 mg total) by mouth daily before breakfast. 07/26/16   Vicie Mutters, PA-C   Meds Ordered and Administered this Visit   Medications  Tdap (BOOSTRIX) injection 0.5 mL (not administered)  lidocaine-EPINEPHrine-tetracaine (LET) solution (3 mLs Topical Given 08/07/16 1558)    BP 116/74   Pulse 105   Temp 98.6 F (37 C)   Resp 16   SpO2 100%  No data found.   Physical Exam  Constitutional: She is oriented to person, place, and time. She appears well-developed and well-nourished.  HENT:  Head: Normocephalic and atraumatic.  Cardiovascular: Normal rate.   HR 90 at repeat  Pulmonary/Chest: Effort normal.  Musculoskeletal:  Right 5th digit  finger has full ROM with intact motor and sensation.   Neurological: She is alert and oriented to person, place, and time.  Skin:  Has a 1.5 Cm straight laceration on right little finger. Bleeding controlled.   Nursing note and vitals reviewed.   Urgent Care Course     .Marland KitchenLaceration Repair Date/Time: 08/07/2016 4:29 PM Performed by: Barry Dienes Authorized by: Ihor Gully D   Consent:    Consent obtained:  Verbal   Consent given by:  Patient   Risks discussed:  Pain and infection   Alternatives discussed:  No treatment Anesthesia (see MAR for exact dosages):    Anesthesia method:  Topical application and local infiltration   Topical anesthetic:  LET   Local anesthetic:  Lidocaine 2% w/o epi Laceration details:    Location:  Finger   Finger location:  R small finger   Length (cm):  1.5   Depth (mm):  2 Repair type:    Repair type:  Simple Pre-procedure details:    Preparation:  Patient was prepped and draped in usual sterile fashion Exploration:    Hemostasis achieved with:  Direct pressure and LET   Wound exploration: wound explored through full range of motion     Wound extent: no foreign bodies/material noted, no muscle damage noted, no nerve damage noted and no vascular damage noted     Contaminated: no   Treatment:    Area cleansed with:  Betadine   Amount of cleaning:  Standard Skin repair:    Repair method:  Sutures   Suture size:  5-0   Suture material:  Nylon   Suture technique:  Simple interrupted   Number of sutures:  4 Approximation:    Approximation:  Close   Vermilion border: well-aligned   Post-procedure details:    Dressing: bandaid.   Patient tolerance of procedure:  Tolerated well, no immediate complications Comments:     Tetanus given today.    (including critical care time)  Labs Review Labs Reviewed - No data to display  Imaging Review No results found.  MDM   1. Laceration of left little finger without foreign body without damage to  nail, initial encounter    Laceration successfully repaired. See procedure note above. Patient tolerated well. Tetanus given today. Return in 7 days for suture removal.     Barry Dienes, NP 08/07/16 [phone]

## 2016-08-15 ENCOUNTER — Encounter (HOSPITAL_COMMUNITY): Payer: Self-pay | Admitting: *Deleted

## 2016-08-15 ENCOUNTER — Ambulatory Visit (HOSPITAL_COMMUNITY)
Admission: EM | Admit: 2016-08-15 | Discharge: 2016-08-15 | Disposition: A | Discharge status: Home / Self Care | Payer: BC Managed Care – PPO | Attending: Family Medicine | Admitting: Family Medicine

## 2016-08-15 DIAGNOSIS — Z4802 Encounter for removal of sutures: Secondary | ICD-10-CM | POA: Diagnosis not present

## 2016-08-15 NOTE — ED Provider Notes (Signed)
CSN: [id number]     Arrival date & time 08/15/16  1201 History   None    Chief Complaint  Patient presents with  . Suture / Staple Removal   (Consider location/radiation/quality/duration/timing/severity/associated sxs/prior Treatment)  41 yo presents for left 5th finger suture removal. 9 days s/p placement. No concerns.       Past Medical History:  Diagnosis Date  . Abnormal Pap smear   . Cervical polyp   . Depression   . Headache(784.0)   . Migraines   . Morbid obesity ([place])   . Prediabetes   . Toxemia in pregnancy   . Umbilical hernia    SOME ABDOMINAL DISCOMFORT  . UTI (urinary tract infection)    ON MEDICATION AS OF 6/10 FOR UTI - HX OF FREQUENT UTI'S  . Vitamin D deficiency    Past Surgical History:  Procedure Laterality Date  . CESAREAN SECTION    . CRYOTHERAPY    . ENDOMETRIAL ABLATION    . TUBAL LIGATION    . UMBILICAL HERNIA REPAIR N/A 12/27/2013   Procedure: HERNIA REPAIR UMBILICAL ADULT;  Surgeon: Harl Bowie, MD;  Location: [facility];  Service: General;  Laterality: N/A;  . WISDOM TOOTH EXTRACTION     Family History  Problem Relation Age of Onset  . Hypertension Mother   . Arthritis Mother   . Hypertension Brother   . Asthma Daughter   . Anesthesia problems Neg Hx   . Hypotension Neg Hx   . Malignant hyperthermia Neg Hx   . Pseudochol deficiency Neg Hx    Social History  Substance Use Topics  . Smoking status: Never Smoker  . Smokeless tobacco: Never Used  . Alcohol use Yes     Comment: occasionally   OB History    Gravida Para Term Preterm AB Living   2 2 1 1   2    SAB TAB Ectopic Multiple Live Births                 Review of Systems  All other systems reviewed and are negative.   Allergies  Patient has no known allergies.  Home Medications   Prior to Admission medications   Medication Sig Start Date End Date Taking? Authorizing Provider  phentermine (ADIPEX-P) 37.5 MG tablet Take 1 tablet (37.5 mg total) by mouth daily  before breakfast. 07/26/16  Yes Vicie Mutters, PA-C  hydrochlorothiazide (MICROZIDE) 12.5 MG capsule Take 1 capsule (12.5 mg total) by mouth daily. 03/23/16   Vicie Mutters, PA-C  ibuprofen (ADVIL,MOTRIN) 200 MG tablet Take 600 mg by mouth every 6 (six) hours as needed for mild pain.    Historical Provider, MD  methocarbamol (ROBAXIN) 500 MG tablet Take 1 tablet (500 mg total) by mouth every 8 (eight) hours as needed for muscle spasms. 11/25/15   Courtney Forcucci, PA-C  Multiple Vitamins-Minerals (MULTIVITAMIN GUMMIES ADULT PO) Take by mouth. DAILY    Historical Provider, MD  naproxen (NAPROSYN) 500 MG tablet Take 1 tablet (500 mg total) by mouth 2 (two) times daily. 11/22/15   Hyman Bible, PA-C   Meds Ordered and Administered this Visit  Medications - No data to display  BP 123/85   Pulse 97   Temp 98.6 F (37 C) (Oral)   Resp 16   SpO2 100%  No data found.   Physical Exam  Constitutional: She appears well-developed and well-nourished.  Skin: Skin is warm and dry.  4 sutures in place with good wound healing  Psychiatric: Her behavior is  normal.  Nursing note and vitals reviewed.   Urgent Care Course     .Suture Removal Date/Time: 08/15/2016 12:56 PM Performed by: Bjorn Pippin Authorized by: Robyn Haber   Consent:    Consent obtained:  Verbal   Consent given by:  Patient   Risks discussed:  Pain   Alternatives discussed:  No treatment Procedure details:    Number of sutures removed:  4 Post-procedure details:    Patient tolerance of procedure:  Tolerated well, no immediate complications   (including critical care time)  Labs Review Labs Reviewed - No data to display  Imaging Review No results found.   Visual Acuity Review  Right Eye Distance:   Left Eye Distance:   Bilateral Distance:    Right Eye Near:   Left Eye Near:    Bilateral Near:         MDM   1. Visit for suture removal    4 sutures removed from left 5th finger without  complication. Wound care instructions given. F/U prn.     Bjorn Pippin, PA-C 08/15/16 1256

## 2016-08-15 NOTE — ED Triage Notes (Signed)
Sutures to left 5th finger placed 1/27.  Presents for suture removal.  No S/S infection.  Wound well-approximated.

## 2016-08-15 NOTE — Discharge Instructions (Signed)
Wound looks great!. Just keep clean and use as normal. Should do fine. Nice to meet you.

## 2016-08-26 ENCOUNTER — Ambulatory Visit: Payer: Self-pay | Admitting: Physician Assistant

## 2016-08-31 ENCOUNTER — Encounter: Payer: Self-pay | Admitting: Physician Assistant

## 2016-08-31 ENCOUNTER — Ambulatory Visit (INDEPENDENT_AMBULATORY_CARE_PROVIDER_SITE_OTHER): Payer: BC Managed Care – PPO | Admitting: Physician Assistant

## 2016-08-31 VITALS — BP 120/90 | HR 100 | Temp 97.3°F | Resp 16 | Ht 61.5 in | Wt 228.0 lb

## 2016-08-31 DIAGNOSIS — I10 Essential (primary) hypertension: Secondary | ICD-10-CM

## 2016-08-31 NOTE — Patient Instructions (Signed)
Simple math prevails.    1st - exercise does not produce significant weight loss - at best one converts fat into muscle , "bulks up", loses inches, but usually stays "weight neutral"     2nd - think of your body weightas a check book: If you eat more calories than you burn up - you save money or gain weight .... Or if you spend more money than you put in the check book, ie burn up more calories than you eat, then you lose weight     3rd - if you walk or run 1 mile, you burn up 100 calories - you have to burn up 3,500 calories to lose 1 pound, ie you have to walk/run 35 miles to lose 1 measly pound. So if you want to lose 10 #, then you have to walk/run 350 miles, so.... clearly exercise is not the solution.     4. So if you consume 1,500 calories, then you have to burn up the equivalent of 15 miles to stay weight neutral - It also stands to reason that if you consume 1,500 cal/day and don't lose weight, then you must be burning up about 1,500 cals/day to stay weight neutral.     5. If you really want to lose weight, you must cut your calorie intake 300 calories /day and at that rate you should lose about 1 # every 3 days.   6. Please purchase Dr Joel Fuhrman's book(s) "The End of Dieting" & "Eat to Live" . It has some great concepts and recipes.     Who Qualifies for Obesity Medications? Although everyone is hopeful for a fast and easy way to lose weight, nothing has been shown to replace a prudent, calorie-controlled diet along with behavior modification as a cornerstone for all obesity treatments.  The next tool that can be used to achieve weight-loss and health improvement is medication. Pharmacotherapy may be offered to individuals affected by obesity who have failed to achieve weight-loss through diet and exercise alone. Currently there are several drugs that are approved by the FDA for weight-loss: phentermine products (Adipex-P or Suprenza)  lorcaserin HCI (Belviq) phentermine-  topiramate ER (Qsymia)  Bupropion; Naltrexone ER (Contrave)  Let's take a closer look at each of these medications and learn how they work:  Phentermine (Adipex-P or Suprenza) How does it work? Phentermine is a medication available by prescription that works on chemicals in the brain to decrease your appetite. It also has a mild stimulant component that adds extra energy. Phentermine is a pill that is taken once a day in the morning time. Tolerance to this medication can develop, so it can only be used for several months at a time. Common side effects are dry mouth, sleeplessness, constipation. Weight-loss: The average weight-loss is 4-5 percent of your weight after one-year. In a 200 pound person, this means about 10 pounds of weight-loss. Patients who receive phentermine can usually expect to see greater weight-loss than those who receive non-pharmacologic care, on average about 13 pounds difference over 12 weeks as reported in one study. Concerns: Due to its stimulant effect, a person's blood pressure and heart rate may increase when on this medication; therefore, you must be monitored closely by a physician who is experienced in prescribing this medication. It cannot be used in patients with some heart conditions (such as poorly controlled blood pressure), glaucoma (increased pressure in your eye), stroke or overactive thyroid. There is some concern for abuse, but this is minimal if the   medication is appropriately used as directed by a healthcare professional.  Lorcaserin (Belviq) How does it work? Lorcaserin was approved in June 2012 by the FDA and became commercially available in June 2013. It works by helping you feel full while eating less, and it works on the chemicals in your brain to help decrease your appetite. Weight-loss: In individuals who took the medication for one-year, it has been shown to have an average of 7 percent weight-loss. In a 200 pound person, this would mean a 14 pound  weight-loss. Blood sugar, cholesterol and blood pressure levels have also been shown to improve. Concerns: The most common side effects are headache, dizziness, fatigue, dry mouth, upper res:piratory tract infection and nausea.  Response to therapy should be evaluated by week 12.  If a patient has not lost at least 5% of baseline body weigh  Phentermine-Topiramate ER (Qsymia) How does it work? This combination medication was approved by the FDA in July 2012. Topiramate is a medication used to treat seizures. It was found that a common side effect of this medication was weight-loss. Phentermine, as described in this brochure, helps to increase your energy and decrease your appetite. Weight-loss: Among individuals who took the highest does of Qsymia (15 mg phentermine and 92 mg of topiramate ER) for one-year, they achieved an average of 14.4 percent weight-loss. In a 200 pound person, a 14.4 percent weight-loss would mean a loss of 29 pounds. Cholesterol levels have also been shown to improve. Concerns: The most common side effects were dry mouth, constipation and pins-and-needle feeling in extremities. Qsymia should NOT be taken during pregnancy since Topiramate ER, a component of Qsymia, has been associated with an increased risk of birth defects.  Bupropion; Naltrexone ER (Contrave) How does it work? Works in two areas of your brain, hunger center and reward center to reduce hunger and cravings.  Weight loss In a 56 week trial patients lost more than 5% of their body weight.  Concerns Most common side effects are dry mouth, constipation or diarrhea, headache.  Please take it with a full glass of water and low fat meal.    Follow-up Visits: Patients are given the opportunity to revisit a topic or obtain more information on an area of interest during follow-up visits. The frequency of and interval between follow-up visits is determined on a patient-by-patient basis. Frequent visits (every 3  to 4 weeks) are encouraged until initial weight-loss goals (5 to 10 percent of body weight) are achieved. At that point, less frequent visits are typically scheduled as needed for individual patients. However, since obesity is considered a chronic life-long problem for many individuals, periodic continual follow up is recommended.   Research has shown that weight-loss as low as 5 percent of initial body weight can lead to favorable improvements in blood pressure, cholesterol, glucose levels and insulin sensitivity. The risk of developing heart disease is reduced the most in patients who have impaired glucose tolerance, type 2 diabetes or high blood pressure.    

## 2016-08-31 NOTE — Progress Notes (Signed)
Assessment and Plan: 1. Essential hypertension - continue medications, DASH diet, exercise and monitor at home. Call if greater than 130/80.   2. Morbid obesity ([place]) Continue keeping weight lose it log Phentermine continue for now   Future Appointments Date Time Provider [place]  11/23/2016 9:30 AM Vicie Mutters, PA-C [id number] None    HPI 41 y.o.female presents for follow up for phentermine start. States it is helping with energy and increasing water however it is not helping with her eating. Has tried contrave and did not help.  BMI is Body mass index is 42.38 kg/m., she is working on diet and exercise. Wt Readings from Last 3 Encounters:  08/31/16 228 lb (103.4 kg)  07/26/16 230 lb 12.8 oz (104.7 kg)  03/23/16 229 lb 6.4 oz (104.1 kg)   Blood pressure 120/90, pulse 100, temperature 97.3 F (36.3 C), resp. rate 16, height 5' 1.5" (1.562 m), weight 228 lb (103.4 kg), SpO2 97 %.   Past Medical History:  Diagnosis Date  . Abnormal Pap smear   . Cervical polyp   . Depression   . Headache(784.0)   . Migraines   . Morbid obesity ([place])   . Prediabetes   . Toxemia in pregnancy   . Umbilical hernia    SOME ABDOMINAL DISCOMFORT  . UTI (urinary tract infection)    ON MEDICATION AS OF 6/10 FOR UTI - HX OF FREQUENT UTI'S  . Vitamin D deficiency      No Known Allergies  Current Outpatient Prescriptions on File Prior to Visit  Medication Sig  . hydrochlorothiazide (MICROZIDE) 12.5 MG capsule Take 1 capsule (12.5 mg total) by mouth daily.  Marland Kitchen ibuprofen (ADVIL,MOTRIN) 200 MG tablet Take 600 mg by mouth every 6 (six) hours as needed for mild pain.  . methocarbamol (ROBAXIN) 500 MG tablet Take 1 tablet (500 mg total) by mouth every 8 (eight) hours as needed for muscle spasms.  . Multiple Vitamins-Minerals (MULTIVITAMIN GUMMIES ADULT PO) Take by mouth. DAILY  . naproxen (NAPROSYN) 500 MG tablet Take 1 tablet (500 mg total) by mouth 2 (two) times daily.  . phentermine  (ADIPEX-P) 37.5 MG tablet Take 1 tablet (37.5 mg total) by mouth daily before breakfast.   No current facility-administered medications on file prior to visit.     ROS: all negative except above.   Physical Exam: Filed Weights   08/31/16 1107  Weight: 228 lb (103.4 kg)   BP 120/90   Pulse 100   Temp 97.3 F (36.3 C)   Resp 16   Ht 5' 1.5" (1.562 m)   Wt 228 lb (103.4 kg)   SpO2 97%   BMI 42.38 kg/m  General Appearance: Well nourished, in no apparent distress. Eyes: PERRLA, EOMs, conjunctiva no swelling or erythema Sinuses: No Frontal/maxillary tenderness ENT/Mouth: Ext aud canals clear, TMs without erythema, bulging. No erythema, swelling, or exudate on post pharynx.  Tonsils not swollen or erythematous. Hearing normal.  Neck: Supple, thyroid normal.  Respiratory: Respiratory effort normal, BS equal bilaterally without rales, rhonchi, wheezing or stridor.  Cardio: RRR with no MRGs. Brisk peripheral pulses without edema.  Abdomen: Soft, + BS.  Non tender, no guarding, rebound, hernias, masses. Lymphatics: Non tender without lymphadenopathy.  Musculoskeletal: Full ROM, 5/5 strength, normal gait.  Skin: Warm, dry without rashes, lesions, ecchymosis.  Neuro: Cranial nerves intact. Normal muscle tone, no cerebellar symptoms. Sensation intact.  Psych: Awake and oriented X 3, normal affect, Insight and Judgment appropriate.     Vicie Mutters, PA-C 11:16  AM [facility]

## 2016-09-17 ENCOUNTER — Encounter: Payer: Self-pay | Admitting: Internal Medicine

## 2016-09-17 ENCOUNTER — Ambulatory Visit (INDEPENDENT_AMBULATORY_CARE_PROVIDER_SITE_OTHER): Payer: BC Managed Care – PPO | Admitting: Internal Medicine

## 2016-09-17 VITALS — BP 122/80 | HR 90 | Temp 98.0°F | Resp 18 | Ht 61.5 in | Wt 230.0 lb

## 2016-09-17 DIAGNOSIS — R350 Frequency of micturition: Secondary | ICD-10-CM | POA: Diagnosis not present

## 2016-09-17 MED ORDER — CIPROFLOXACIN HCL 500 MG PO TABS: 500.0000 mg | ORAL_TABLET | Freq: Two times a day (BID) | ORAL | 0 refills | Status: AC

## 2016-09-17 MED ORDER — SUMATRIPTAN SUCCINATE 50 MG PO TABS: 50.0000 mg | ORAL_TABLET | Freq: Once | ORAL | 2 refills | Status: DC | PRN

## 2016-09-17 NOTE — Progress Notes (Signed)
Assessment and Plan:   1. Urinary frequency -hold cipro until we release mychart results.   -may be odor due to protein drinks or poor hydration.  Encouraged to increase hydration levels. - Urinalysis, Routine w reflex microscopic - Urine culture     HPI 41 y.o.female presents for urinary odor.  She reports that her urine has a foul odor to it.  She reports that she does not have any pain when she urinates.  She has some increase in her urinary frequency.  No vaginal discharge.  NO vaginal rash.  She reports that she does get some mild discomfort with urinating.  She does very frequently get UTIs.    Past Medical History:  Diagnosis Date  . Abnormal Pap smear   . Cervical polyp   . Depression   . Headache(784.0)   . Migraines   . Morbid obesity ([place])   . Prediabetes   . Toxemia in pregnancy   . Umbilical hernia    SOME ABDOMINAL DISCOMFORT  . UTI (urinary tract infection)    ON MEDICATION AS OF 6/10 FOR UTI - HX OF FREQUENT UTI'S  . Vitamin D deficiency      No Known Allergies    Current Outpatient Prescriptions on File Prior to Visit  Medication Sig Dispense Refill  . hydrochlorothiazide (MICROZIDE) 12.5 MG capsule Take 1 capsule (12.5 mg total) by mouth daily. 30 capsule 3  . ibuprofen (ADVIL,MOTRIN) 200 MG tablet Take 600 mg by mouth every 6 (six) hours as needed for mild pain.    . methocarbamol (ROBAXIN) 500 MG tablet Take 1 tablet (500 mg total) by mouth every 8 (eight) hours as needed for muscle spasms. 60 tablet 0  . Multiple Vitamins-Minerals (MULTIVITAMIN GUMMIES ADULT PO) Take by mouth. DAILY    . naproxen (NAPROSYN) 500 MG tablet Take 1 tablet (500 mg total) by mouth 2 (two) times daily. 30 tablet 0  . phentermine (ADIPEX-P) 37.5 MG tablet Take 1 tablet (37.5 mg total) by mouth daily before breakfast. 30 tablet 2   No current facility-administered medications on file prior to visit.     ROS: all negative except above.   Physical Exam: Filed Weights   09/17/16 1101  Weight: 230 lb (104.3 kg)   BP 122/80   Pulse 90   Temp 98 F (36.7 C) (Temporal)   Resp 18   Ht 5' 1.5" (1.562 m)   Wt 230 lb (104.3 kg)   BMI 42.75 kg/m  General Appearance: Well developed well nourished, non-toxic appearing in no apparent distress. Eyes: PERRLA, EOMs, conjunctiva w/ no swelling or erythema or discharge Sinuses: No Frontal/maxillary tenderness ENT/Mouth: Ear canals clear without swelling or erythema.  TM's normal bilaterally with no retractions, bulging, or loss of landmarks.   Neck: Supple, thyroid normal, no notable JVD  Respiratory: Respiratory effort normal, Clear breath sounds anteriorly and posteriorly bilaterally without rales, rhonchi, wheezing or stridor. No retractions or accessory muscle usage. Cardio: RRR with no MRGs.   Abdomen: Soft, + BS.  Non tender, no guarding, rebound, hernias, masses. No CVA tenderness.   Musculoskeletal: Full ROM, 5/5 strength, normal gait.  Skin: Warm, dry without rashes  Neuro: Awake and oriented X 3, Cranial nerves intact. Normal muscle tone, no cerebellar symptoms. Sensation intact.  Psych: normal affect, Insight and Judgment appropriate.     Starlyn Skeans, PA-C 11:09 AM [facility]

## 2016-09-18 LAB — URINALYSIS, ROUTINE W REFLEX MICROSCOPIC
Bilirubin Urine: NEGATIVE
Glucose, UA: NEGATIVE
HGB URINE DIPSTICK: NEGATIVE
Ketones, ur: NEGATIVE
LEUKOCYTES UA: NEGATIVE
NITRITE: NEGATIVE
PROTEIN: NEGATIVE
Specific Gravity, Urine: 1.013 (ref 1.001–1.035)
pH: 6 (ref 5.0–8.0)

## 2016-09-19 LAB — URINE CULTURE: ORGANISM ID, BACTERIA: NO GROWTH

## 2016-10-26 ENCOUNTER — Ambulatory Visit (INDEPENDENT_AMBULATORY_CARE_PROVIDER_SITE_OTHER): Payer: BC Managed Care – PPO | Admitting: Physician Assistant

## 2016-10-26 VITALS — BP 122/90 | HR 89 | Temp 97.5°F | Resp 16 | Ht 61.5 in | Wt 230.0 lb

## 2016-10-26 DIAGNOSIS — R350 Frequency of micturition: Secondary | ICD-10-CM | POA: Diagnosis not present

## 2016-10-26 MED ORDER — PHENAZOPYRIDINE HCL 200 MG PO TABS: 200.0000 mg | ORAL_TABLET | Freq: Three times a day (TID) | ORAL | 2 refills | Status: DC | PRN

## 2016-10-26 NOTE — Progress Notes (Signed)
Assessment and Plan: Urinary frequency -hold cipro, discussed ICS - decease artifical soda, increase water, decrease spicy food/stress - get PAP - Urinalysis, Routine w reflex microscopic - Urine culture  Future Appointments Date Time Provider [place]  11/23/2016 9:30 AM Vicie Mutters, PA-C [facility] None    HPI 41 y.o.female presents for urinary frequency with pain at the end of urination x yesterday, has history of frequency UTI. 03/09 that was normal. Denies nausea, fever, chills, hematuria, flank pain. No new sexual partners, no vaginal discharge.   Blood pressure 122/90, pulse 89, temperature 97.5 F (36.4 C), resp. rate 16, height 5' 1.5" (1.562 m), weight 230 lb (104.3 kg), SpO2 98 %.  BMI is Body mass index is 42.75 kg/m., she is working on diet and exercise. Wt Readings from Last 3 Encounters:  10/26/16 230 lb (104.3 kg)  09/17/16 230 lb (104.3 kg)  08/31/16 228 lb (103.4 kg)    Past Medical History:  Diagnosis Date  . Abnormal Pap smear   . Cervical polyp   . Depression   . Headache(784.0)   . Migraines   . Morbid obesity ([place])   . Prediabetes   . Toxemia in pregnancy   . Umbilical hernia    SOME ABDOMINAL DISCOMFORT  . UTI (urinary tract infection)    ON MEDICATION AS OF 6/10 FOR UTI - HX OF FREQUENT UTI'S  . Vitamin D deficiency      No Known Allergies    Current Outpatient Prescriptions on File Prior to Visit  Medication Sig Dispense Refill  . hydrochlorothiazide (MICROZIDE) 12.5 MG capsule Take 1 capsule (12.5 mg total) by mouth daily. 30 capsule 3  . ibuprofen (ADVIL,MOTRIN) 200 MG tablet Take 600 mg by mouth every 6 (six) hours as needed for mild pain.    . methocarbamol (ROBAXIN) 500 MG tablet Take 1 tablet (500 mg total) by mouth every 8 (eight) hours as needed for muscle spasms. 60 tablet 0  . Multiple Vitamins-Minerals (MULTIVITAMIN GUMMIES ADULT PO) Take by mouth. DAILY    . naproxen (NAPROSYN) 500 MG tablet Take 1 tablet (500 mg  total) by mouth 2 (two) times daily. 30 tablet 0  . phentermine (ADIPEX-P) 37.5 MG tablet Take 1 tablet (37.5 mg total) by mouth daily before breakfast. 30 tablet 2  . SUMAtriptan (IMITREX) 100 MG tablet Take 1 tablet (100 mg total) by mouth once as needed for migraine. May repeat in 2 hours if headache persists or recurs. 6 tablet 2  . SUMAtriptan (IMITREX) 50 MG tablet Take 1 tablet (50 mg total) by mouth once as needed for migraine. May repeat in 2 hours if headache persists or recurs. 30 tablet 2   No current facility-administered medications on file prior to visit.     ROS: all negative except above.   Physical Exam: Filed Weights   10/26/16 1514  Weight: 230 lb (104.3 kg)   BP 122/90   Pulse 89   Temp 97.5 F (36.4 C)   Resp 16   Ht 5' 1.5" (1.562 m)   Wt 230 lb (104.3 kg)   SpO2 98%   BMI 42.75 kg/m  General Appearance: Well developed well nourished, non-toxic appearing in no apparent distress. Eyes: PERRLA, EOMs, conjunctiva w/ no swelling or erythema or discharge Sinuses: No Frontal/maxillary tenderness ENT/Mouth: Ear canals clear without swelling or erythema.  TM's normal bilaterally with no retractions, bulging, or loss of landmarks.   Neck: Supple, thyroid normal, no notable JVD  Respiratory: Respiratory effort normal, Clear breath  sounds anteriorly and posteriorly bilaterally without rales, rhonchi, wheezing or stridor. No retractions or accessory muscle usage. Cardio: RRR with no MRGs.   Abdomen: Soft, + BS.  Non tender, no guarding, rebound, hernias, masses. No CVA tenderness.   Musculoskeletal: Full ROM, 5/5 strength, normal gait.  Skin: Warm, dry without rashes  Neuro: Awake and oriented X 3, Cranial nerves intact. Normal muscle tone, no cerebellar symptoms. Sensation intact.  Psych: normal affect, Insight and Judgment appropriate.     Vicie Mutters, PA-C 3:35 PM [facility]

## 2016-10-26 NOTE — Patient Instructions (Signed)
Interstitial Cystitis Interstitial cystitis is a condition that causes inflammation of the bladder. The bladder is a hollow organ in the lower part of your abdomen. It stores urine after the urine is made by your kidneys. With interstitial cystitis, you may have pain in the bladder area. You may also have a frequent and urgent need to urinate. The severity of interstitial cystitis can vary from person to person. You may have flare-ups of the condition, and then it may go away for a while. For many people who have this condition, it becomes a long-term problem. What are the causes? The cause of this condition is not known. What increases the risk? This condition is more likely to develop in women. What are the signs or symptoms? Symptoms of interstitial cystitis vary, and they can change over time. Symptoms may include:  Discomfort or pain in the bladder area. This can range from mild to severe. The pain may change in intensity as the bladder fills with urine or as it empties.  Pelvic pain.  An urgent need to urinate.  Frequent urination.  Pain during sexual intercourse.  Pinpoint bleeding on the bladder wall. For women, the symptoms often get worse during menstruation. How is this diagnosed? This condition is diagnosed by evaluating your symptoms and ruling out other causes. A physical exam will be done. Various tests may be done to rule out other conditions. Common tests include:  Urine tests.  Cystoscopy. In this test, a tool that is like a very thin telescope is used to look into your bladder.  Biopsy. This involves taking a sample of tissue from the bladder wall to be examined under a microscope. How is this treated? There is no cure for interstitial cystitis, but treatment methods are available to control your symptoms. Work closely with your health care provider to find the treatments that will be most effective for you. Treatment options may include:  Medicines to relieve pain  and to help reduce the number of times that you feel the need to urinate.  Bladder training. This involves learning ways to control when you urinate, such as:  Urinating at scheduled times.  Training yourself to delay urination.  Doing exercises (Kegel exercises) to strengthen the muscles that control urine flow.  Lifestyle changes, such as changing your diet or taking steps to control stress.  Use of a device that provides electrical stimulation in order to reduce pain.  A procedure that stretches your bladder by filling it with air or fluid.  Surgery. This is rare. It is only done for extreme cases if other treatments do not help. Follow these instructions at home:  Take medicines only as directed by your health care provider.  Use bladder training techniques as directed.  Keep a bladder diary to find out which foods, liquids, or activities make your symptoms worse.  Use your bladder diary to schedule bathroom trips. If you are away from home, plan to be near a bathroom at each of your scheduled times.  Make sure you urinate just before you leave the house and just before you go to bed.  Do Kegel exercises as directed by your health care provider.  Do not drink alcohol.  Do not use any tobacco products, including cigarettes, chewing tobacco, or electronic cigarettes. If you need help quitting, ask your health care provider.  Make dietary changes as directed by your health care provider. You may need to avoid spicy foods and foods that contain a high amount of potassium.  Limit your drinking of beverages that stimulate urination. These include soda, coffee, and tea.  Keep all follow-up visits as directed by your health care provider. This is important. Contact a health care provider if:  Your symptoms do not get better after treatment.  Your pain and discomfort are getting worse.  You have more frequent urges to urinate.  You have a fever. Get help right away  if:  You are not able to control your bladder at all. This information is not intended to replace advice given to you by your health care provider. Make sure you discuss any questions you have with your health care provider. Document Released: 02/27/2004 Document Revised: 12/04/2015 Document Reviewed: 03/05/2014 Elsevier Interactive Patient Education  2017 [employer].

## 2016-10-27 LAB — URINALYSIS, ROUTINE W REFLEX MICROSCOPIC
Bilirubin Urine: NEGATIVE
GLUCOSE, UA: NEGATIVE
Ketones, ur: NEGATIVE
NITRITE: NEGATIVE
PH: 7 (ref 5.0–8.0)
Protein, ur: NEGATIVE
Specific Gravity, Urine: 1.014 (ref 1.001–1.035)

## 2016-10-27 LAB — URINALYSIS, MICROSCOPIC ONLY
BACTERIA UA: NONE SEEN [HPF]
Casts: NONE SEEN [LPF]
Crystals: NONE SEEN [HPF]
Squamous Epithelial / LPF: NONE SEEN [HPF] (ref <=5)
Yeast: NONE SEEN [HPF]

## 2016-10-28 ENCOUNTER — Encounter: Payer: Self-pay | Admitting: Physician Assistant

## 2016-10-28 ENCOUNTER — Other Ambulatory Visit: Payer: Self-pay | Admitting: Physician Assistant

## 2016-10-28 DIAGNOSIS — R350 Frequency of micturition: Secondary | ICD-10-CM

## 2016-10-28 LAB — URINE CULTURE

## 2016-10-28 MED ORDER — CIPROFLOXACIN HCL 500 MG PO TABS: 500.0000 mg | ORAL_TABLET | Freq: Two times a day (BID) | ORAL | 0 refills | Status: DC

## 2016-10-29 ENCOUNTER — Other Ambulatory Visit: Payer: Self-pay | Admitting: Physician Assistant
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
## 2016-11-23 ENCOUNTER — Ambulatory Visit: Payer: Self-pay | Admitting: Physician Assistant
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
## 2016-11-30 ENCOUNTER — Ambulatory Visit: Payer: Self-pay | Admitting: Physician Assistant

## 2016-12-27 NOTE — Progress Notes (Deleted)
Assessment and Plan:  Hypertension -Continue medication, monitor blood pressure at home. Continue DASH diet.  Reminder to go to the ER if any CP, SOB, nausea, dizziness, severe HA, changes vision/speech, left arm numbness and tingling and jaw pain.   Cholesterol -Continue diet and exercise. Check cholesterol.    Prediabetes  -Continue diet and exercise.    Vitamin D Def - check level and continue medications.   Obesity with co morbidities  long discussion about weight loss, diet, and exercise  discussed gastric sleeve, given information for seminar however discussed need to work on weight loss/habits more before surgery Restart phentermine, declines dietician at this time.  Close follow up  Continue diet and meds as discussed. Further disposition pending results of labs. Over 30 minutes of exam, counseling, chart review, and critical decision making was performed Future Appointments Date Time Provider [place]  12/28/2016 9:45 AM Vicie Mutters, PA-C [facility] None    HPI 41 y.o. female  presents for 3 month follow up on hypertension, cholesterol, prediabetes, and vitamin D deficiency.   Her blood pressure has been controlled at home, today their BP is    She does workout, has been walking. She denies chest pain, shortness of breath, dizziness.  She is not on cholesterol medication and denies myalgias. Her cholesterol is at goal. The cholesterol last visit was:   Lab Results  Component Value Date   CHOL 161 07/09/2015   HDL 44 (L) 07/09/2015   LDLCALC 98 07/09/2015   TRIG 95 07/09/2015   CHOLHDL 3.7 07/09/2015    She has been working on diet and exercise for prediabetes, and denies paresthesia of the feet, polydipsia, polyuria and visual disturbances. Last A1C in the office was:  Lab Results  Component Value Date   HGBA1C 5.3 03/23/2016   Patient is NOT on Vitamin D supplement.   Lab Results  Component Value Date   VD25OH 16 (L) 07/26/2016   BMI is There is  no height or weight on file to calculate BMI., she is working on diet and exercise.  Wt Readings from Last 3 Encounters:  10/26/16 230 lb (104.3 kg)  09/17/16 230 lb (104.3 kg)  08/31/16 228 lb (103.4 kg)    Current Medications:  Current Outpatient Prescriptions on File Prior to Visit  Medication Sig Dispense Refill  . ciprofloxacin (CIPRO) 500 MG tablet Take 1 tablet (500 mg total) by mouth 2 (two) times daily. 7 days 14 tablet 0  . fluconazole (DIFLUCAN) 150 MG tablet Take 1 tablet weekly if needed for Yeast infection 3 tablet 0  . hydrochlorothiazide (MICROZIDE) 12.5 MG capsule Take 1 capsule (12.5 mg total) by mouth daily. 30 capsule 3  . ibuprofen (ADVIL,MOTRIN) 200 MG tablet Take 600 mg by mouth every 6 (six) hours as needed for mild pain.    . methocarbamol (ROBAXIN) 500 MG tablet Take 1 tablet (500 mg total) by mouth every 8 (eight) hours as needed for muscle spasms. 60 tablet 0  . Multiple Vitamins-Minerals (MULTIVITAMIN GUMMIES ADULT PO) Take by mouth. DAILY    . naproxen (NAPROSYN) 500 MG tablet Take 1 tablet (500 mg total) by mouth 2 (two) times daily. 30 tablet 0  . phenazopyridine (PYRIDIUM) 200 MG tablet Take 1 tablet (200 mg total) by mouth 3 (three) times daily as needed for pain. For 2 days.  Take after meals. 10 tablet 2  . phentermine (ADIPEX-P) 37.5 MG tablet Take 1 tablet (37.5 mg total) by mouth daily before breakfast. 30 tablet 2  .  SUMAtriptan (IMITREX) 100 MG tablet Take 1 tablet (100 mg total) by mouth once as needed for migraine. May repeat in 2 hours if headache persists or recurs. 6 tablet 2  . SUMAtriptan (IMITREX) 50 MG tablet Take 1 tablet (50 mg total) by mouth once as needed for migraine. May repeat in 2 hours if headache persists or recurs. 30 tablet 2   No current facility-administered medications on file prior to visit.    Medical History:  Past Medical History:  Diagnosis Date  . Abnormal Pap smear   . Cervical polyp   . Depression   .  Headache(784.0)   . Migraines   . Morbid obesity ([place])   . Prediabetes   . Toxemia in pregnancy   . Umbilical hernia    SOME ABDOMINAL DISCOMFORT  . UTI (urinary tract infection)    ON MEDICATION AS OF 6/10 FOR UTI - HX OF FREQUENT UTI'S  . Vitamin D deficiency    Allergies: No Known Allergies   Review of Systems:  Review of Systems  Constitutional: Negative.   HENT: Negative.   Eyes: Negative.   Respiratory: Negative.   Cardiovascular: Negative.   Gastrointestinal: Negative.   Genitourinary: Negative.   Musculoskeletal: Negative.   Skin: Negative.   Neurological: Negative.   Endo/Heme/Allergies: Negative.   Psychiatric/Behavioral: Negative.     Family history- Review and unchanged Social history- Review and unchanged Physical Exam: There were no vitals taken for this visit. Wt Readings from Last 3 Encounters:  10/26/16 230 lb (104.3 kg)  09/17/16 230 lb (104.3 kg)  08/31/16 228 lb (103.4 kg)   General Appearance: Well nourished, in no apparent distress. Eyes: PERRLA, EOMs, conjunctiva no swelling or erythema Sinuses: No Frontal/maxillary tenderness ENT/Mouth: Ext aud canals clear, TMs without erythema, bulging. No erythema, swelling, or exudate on post pharynx.  Tonsils not swollen or erythematous. Hearing normal.  Neck: Supple, thyroid normal.  Respiratory: Respiratory effort normal, BS equal bilaterally without rales, rhonchi, wheezing or stridor.  Cardio: RRR with no MRGs. Brisk peripheral pulses without edema.  Abdomen: Soft, + BS,  Non tender, no guarding, rebound, hernias, masses. Lymphatics: Non tender without lymphadenopathy.  Musculoskeletal: Full ROM, 5/5 strength, Normal gait Skin: Warm, dry without rashes, lesions, ecchymosis.  Neuro: Cranial nerves intact. Normal muscle tone, no cerebellar symptoms. Psych: Awake and oriented X 3, normal affect, Insight and Judgment appropriate.    Vicie Mutters, PA-C 6:53 AM [facility]

## 2016-12-28 ENCOUNTER — Ambulatory Visit: Payer: Self-pay | Admitting: Physician Assistant

## 2017-01-17 ENCOUNTER — Ambulatory Visit: Payer: Self-pay | Admitting: Physician Assistant

## 2017-01-23 NOTE — Progress Notes (Signed)
Assessment and Plan:  Hypertension -Continue medication, monitor blood pressure at home. Continue DASH diet.  Reminder to go to the ER if any CP, SOB, nausea, dizziness, severe HA, changes vision/speech, left arm numbness and tingling and jaw pain.   Cholesterol -Continue diet and exercise. Check cholesterol.    Prediabetes  -Continue diet and exercise.    Vitamin D Def - check level and continue medications.   Obesity with co morbidities  long discussion about weight loss, diet, and exercise Try belviq Continue myfitness pal  Continue diet and meds as discussed. Further disposition pending results of labs. Over 30 minutes of exam, counseling, chart review, and critical decision making was performed No future appointments.  HPI 41 y.o. female  presents for 3 month follow up on hypertension, cholesterol, prediabetes, and vitamin D deficiency.   Her blood pressure has been controlled at home, today their BP is BP: 118/82  She does workout, has been walking. She denies chest pain, shortness of breath, dizziness.  She is not on cholesterol medication and denies myalgias. Her cholesterol is at goal. The cholesterol last visit was:   Lab Results  Component Value Date   CHOL 161 07/09/2015   HDL 44 (L) 07/09/2015   LDLCALC 98 07/09/2015   TRIG 95 07/09/2015   CHOLHDL 3.7 07/09/2015    She has been working on diet and exercise for prediabetes, and denies paresthesia of the feet, polydipsia, polyuria and visual disturbances. Last A1C in the office was:  Lab Results  Component Value Date   HGBA1C 5.3 03/23/2016   Patient is NOT on Vitamin D supplement.   Lab Results  Component Value Date   VD25OH 16 (L) 07/26/2016   BMI is Body mass index is 42.9 kg/m., she is working on diet and exercise. Contrave did not help, phentermine has been on it but off x 79month. Has been keeping food diary, and trying to exercise.  Wt Readings from Last 3 Encounters:  01/24/17 230 lb 12.8 oz (104.7  kg)  10/26/16 230 lb (104.3 kg)  09/17/16 230 lb (104.3 kg)    Current Medications:  Current Outpatient Prescriptions on File Prior to Visit  Medication Sig Dispense Refill  . fluconazole (DIFLUCAN) 150 MG tablet Take 1 tablet weekly if needed for Yeast infection 3 tablet 0  . hydrochlorothiazide (MICROZIDE) 12.5 MG capsule Take 1 capsule (12.5 mg total) by mouth daily. 30 capsule 3  . ibuprofen (ADVIL,MOTRIN) 200 MG tablet Take 600 mg by mouth every 6 (six) hours as needed for mild pain.    . methocarbamol (ROBAXIN) 500 MG tablet Take 1 tablet (500 mg total) by mouth every 8 (eight) hours as needed for muscle spasms. 60 tablet 0  . Multiple Vitamins-Minerals (MULTIVITAMIN GUMMIES ADULT PO) Take by mouth. DAILY    . naproxen (NAPROSYN) 500 MG tablet Take 1 tablet (500 mg total) by mouth 2 (two) times daily. 30 tablet 0  . SUMAtriptan (IMITREX) 100 MG tablet Take 1 tablet (100 mg total) by mouth once as needed for migraine. May repeat in 2 hours if headache persists or recurs. 6 tablet 2  . SUMAtriptan (IMITREX) 50 MG tablet Take 1 tablet (50 mg total) by mouth once as needed for migraine. May repeat in 2 hours if headache persists or recurs. 30 tablet 2   No current facility-administered medications on file prior to visit.    Medical History:  Past Medical History:  Diagnosis Date  . Abnormal Pap smear   . Cervical polyp   .  Depression   . Headache(784.0)   . Migraines   . Morbid obesity ([place])   . Prediabetes   . Toxemia in pregnancy   . Umbilical hernia    SOME ABDOMINAL DISCOMFORT  . UTI (urinary tract infection)    ON MEDICATION AS OF 6/10 FOR UTI - HX OF FREQUENT UTI'S  . Vitamin D deficiency    Allergies: No Known Allergies   Review of Systems:  Review of Systems  Constitutional: Negative.   HENT: Negative.   Eyes: Negative.   Respiratory: Negative.   Cardiovascular: Negative.   Gastrointestinal: Negative.   Genitourinary: Negative.   Musculoskeletal: Negative.    Skin: Negative.   Neurological: Negative.   Endo/Heme/Allergies: Negative.   Psychiatric/Behavioral: Negative.     Family history- Review and unchanged Social history- Review and unchanged Physical Exam: BP 118/82   Pulse (!) 101   Temp (!) 97.5 F (36.4 C)   Resp 16   Ht 5' 1.5" (1.562 m)   Wt 230 lb 12.8 oz (104.7 kg)   BMI 42.90 kg/m  Wt Readings from Last 3 Encounters:  01/24/17 230 lb 12.8 oz (104.7 kg)  10/26/16 230 lb (104.3 kg)  09/17/16 230 lb (104.3 kg)   General Appearance: Well nourished, in no apparent distress. Eyes: PERRLA, EOMs, conjunctiva no swelling or erythema Sinuses: No Frontal/maxillary tenderness ENT/Mouth: Ext aud canals clear, TMs without erythema, bulging. No erythema, swelling, or exudate on post pharynx.  Tonsils not swollen or erythematous. Hearing normal.  Neck: Supple, thyroid normal.  Respiratory: Respiratory effort normal, BS equal bilaterally without rales, rhonchi, wheezing or stridor.  Cardio: RRR with no MRGs. Brisk peripheral pulses without edema.  Abdomen: Soft, + BS,  Non tender, no guarding, rebound, hernias, masses. Lymphatics: Non tender without lymphadenopathy.  Musculoskeletal: Full ROM, 5/5 strength, Normal gait Skin: Warm, dry without rashes, lesions, ecchymosis.  Neuro: Cranial nerves intact. Normal muscle tone, no cerebellar symptoms. Psych: Awake and oriented X 3, normal affect, Insight and Judgment appropriate.    Vicie Mutters, PA-C 4:44 PM [facility]

## 2017-01-24 ENCOUNTER — Encounter: Payer: Self-pay | Admitting: Physician Assistant

## 2017-01-24 ENCOUNTER — Ambulatory Visit (INDEPENDENT_AMBULATORY_CARE_PROVIDER_SITE_OTHER): Payer: BC Managed Care – PPO | Admitting: Physician Assistant

## 2017-01-24 VITALS — BP 118/82 | HR 101 | Temp 97.5°F | Resp 16 | Ht 61.5 in | Wt 230.8 lb

## 2017-01-24 DIAGNOSIS — Z79899 Other long term (current) drug therapy: Secondary | ICD-10-CM | POA: Diagnosis not present

## 2017-01-24 DIAGNOSIS — R7303 Prediabetes: Secondary | ICD-10-CM

## 2017-01-24 DIAGNOSIS — I10 Essential (primary) hypertension: Secondary | ICD-10-CM | POA: Diagnosis not present

## 2017-01-24 DIAGNOSIS — E559 Vitamin D deficiency, unspecified: Secondary | ICD-10-CM

## 2017-01-24 MED ORDER — LORCASERIN HCL ER 20 MG PO TB24: 20.0000 mg | ORAL_TABLET | Freq: Every day | ORAL | 3 refills | Status: DC

## 2017-01-24 NOTE — Patient Instructions (Addendum)
We want you on at least 5000 IU daily  Vitamin D goal is between 60-80  Please make sure that you are taking your Vitamin D as directed.   It is very important as a natural anti-inflammatory   helping hair, skin, and nails, as well as reducing stroke and heart attack risk.   It helps your bones and helps with mood.  HELPS WITH YOUR ENERGY  It also decreases numerous cancer risks so please take it as directed.   Low Vit D is associated with a 200-300% higher risk for CANCER   and 200-300% higher risk for HEART   ATTACK  &  STROKE.    .....................................Marland Kitchen  It is also associated with higher death rate at younger ages,   autoimmune diseases like Rheumatoid arthritis, Lupus, Multiple Sclerosis.     Also many other serious conditions, like depression, Alzheimer's  Dementia, infertility, muscle aches, fatigue, fibromyalgia - just to name a few.  +++++++++++++++++++  Can get liquid vitamin D from [place] here in [place] at  [facility] alternatives [address] [place], [place] [address] Or you can try earth fare    Drink 80-100 oz a day of water, measure it out Eat 3 meals a day, have to do breakfast, eat protein- hard boiled eggs, protein bar like nature valley protein bar, greek yogurt like oikos triple zero, chobani 100, or light n fit greek  Can check out plantnanny app on your phone to help you keep track of your water  Will do the belviq at first and if that does not help look into saxenda Look up saxenda to see if you want to try this for weight loss   Simple math prevails.    1st - exercise does not produce significant weight loss - at best one converts fat into muscle , "bulks up", loses inches, but usually stays "weight neutral"     2nd - think of your body weightas a check book: If you eat more calories than you burn up - you save money or gain weight .... Or if you spend more money than you put in the check book, ie burn up more calories  than you eat, then you lose weight     3rd - if you walk or run 1 mile, you burn up 100 calories - you have to burn up 3,500 calories to lose 1 pound, ie you have to walk/run 35 miles to lose 1 measly pound. So if you want to lose 10 #, then you have to walk/run 350 miles, so.... clearly exercise is not the solution.     4. So if you consume 1,500 calories, then you have to burn up the equivalent of 15 miles to stay weight neutral - It also stands to reason that if you consume 1,500 cal/day and don't lose weight, then you must be burning up about 1,500 cals/day to stay weight neutral.     5. If you really want to lose weight, you must cut your calorie intake 300 calories /day and at that rate you should lose about 1 # every 3 days.   6. Please purchase Dr Fara Olden Fuhrman's book(s) "The End of Dieting" & "Eat to Live" . It has some great concepts and recipes.     We want weight loss that will last so you should lose 1-2 pounds a week.  THAT IS IT! Please pick THREE things a month to change. Once it is a habit check off the item. Then pick  another three items off the list to become habits.  If you are already doing a habit on the list GREAT!  Cross that item off! o Don't drink your calories. Ie, alcohol, soda, fruit juice, and sweet tea.  o Drink more water. Drink a glass when you feel hungry or before each meal.  o Eat breakfast - Complex carb and protein (likeDannon light and fit yogurt, oatmeal, fruit, eggs, [place] bacon). o Measure your cereal.  Eat no more than one cup a day. (ie [place]) o Eat an apple a day. o Add a vegetable a day. o Try a new vegetable a month. o Use Pam! Stop using oil or butter to cook. o Don't finish your plate or use smaller plates. o Share your dessert. o Eat sugar free Jello for dessert or frozen grapes. o Don't eat 2-3 hours before bed. o Switch to whole wheat bread, pasta, and brown rice. o Make healthier choices when you eat out. No fries! o Pick baked chicken,  NOT fried. o Don't forget to SLOW DOWN when you eat. It is not going anywhere.  o Take the stairs. o Park far away in the parking lot o [employer] (or weights) for 10 minutes while watching TV. o Walk at work for 10 minutes during break. o Walk outside 1 time a week with your friend, kids, dog, or significant other. o Start a walking group at [place] the mall as much as you can tolerate.  o Keep a food diary. o Weigh yourself daily. o Walk for 15 minutes 3 days per week. o Cook at home more often and eat out less.  If life happens and you go back to old habits, it is okay.  Just start over. You can do it!   If you experience chest pain, get short of breath, or tired during the exercise, please stop immediately and inform your doctor.     Fatty Liver Fatty liver is the accumulation of fat in liver cells. It is also called hepatosteatosis or steatohepatitis. It is normal for your liver to contain some fat. If fat is more than 5 to 10% of your liver's weight, you have fatty liver.  There are often no symptoms (problems) for years while damage is still occurring. People often learn about their fatty liver when they have medical tests for other reasons. Fat can damage your liver for years or even decades without causing problems. When it becomes severe, it can cause fatigue, weight loss, weakness, and confusion. This makes you more likely to develop more serious liver problems. The liver is the largest organ in the body. It does a lot of work and often gives no warning signs when it is sick until late in a disease. The liver has many important jobs including:  Breaking down foods.  Storing vitamins, iron, and other minerals.  Making proteins.  Making bile for food digestion.  Breaking down many products including medications, alcohol and some poisons.  PROGNOSIS  Fatty liver may cause no damage or it can lead to an inflammation of the liver. This is, called steatohepatitis.   Over time the liver may become scarred and hardened. This condition is called cirrhosis. Cirrhosis is serious and may lead to liver failure or cancer. NASH is one of the leading causes of cirrhosis. About 10-20% of Americans have fatty liver and a smaller 2-5% has NASH.  TREATMENT   Weight loss, fat restriction, and exercise in overweight patients produces inconsistent results  but is worth trying.  Good control of diabetes may reduce fatty liver.  Eat a balanced, healthy diet.  Increase your physical activity.  There are no medical or surgical treatments for a fatty liver or NASH, but improving your diet and increasing your exercise may help prevent or reverse some of the damage.

## 2017-02-22 ENCOUNTER — Encounter: Payer: Self-pay | Admitting: Physician Assistant

## 2017-02-23 ENCOUNTER — Other Ambulatory Visit: Payer: Self-pay | Admitting: Physician Assistant

## 2017-02-23 DIAGNOSIS — I10 Essential (primary) hypertension: Secondary | ICD-10-CM

## 2017-02-23 MED ORDER — METHOCARBAMOL 500 MG PO TABS: 500.0000 mg | ORAL_TABLET | Freq: Three times a day (TID) | ORAL | 0 refills | Status: DC | PRN

## 2017-02-23 MED ORDER — HYDROCHLOROTHIAZIDE 12.5 MG PO CAPS: 12.5000 mg | ORAL_CAPSULE | Freq: Every day | ORAL | 3 refills | Status: DC

## 2017-05-24 ENCOUNTER — Ambulatory Visit: Payer: Self-pay | Admitting: Physician Assistant

## 2017-06-13 ENCOUNTER — Encounter: Payer: Self-pay | Admitting: Physician Assistant

## 2017-06-13 NOTE — Progress Notes (Signed)
Assessment and Plan:  Hypertension -Continue medication, monitor blood pressure at home. Continue DASH diet.  Reminder to go to the ER if any CP, SOB, nausea, dizziness, severe HA, changes vision/speech, left arm numbness and tingling and jaw pain.   Cholesterol -Continue diet and exercise. Check cholesterol.    Prediabetes  -Continue diet and exercise.    Vitamin D Def - check level and continue medications.   Obesity with co morbidities  long discussion about weight loss, diet, and exercise Restart myfitness pal  Continue diet and meds as discussed. Further disposition pending results of labs. Over 30 minutes of exam, counseling, chart review, and critical decision making was performed WILL GET LABS AT CPE Future Appointments  Date Time Provider [place]  10/12/2017 10:00 AM Vicie Mutters, PA-C [facility] None    HPI 41 y.o. female  presents for 3 month follow up on hypertension, cholesterol, prediabetes, and vitamin D deficiency.   Her blood pressure has been controlled at home, today their BP is BP: 124/76  She does workout, has been walking. She denies chest pain, shortness of breath, dizziness.  She is not on cholesterol medication and denies myalgias. Her cholesterol is at goal. The cholesterol last visit was:   Lab Results  Component Value Date   CHOL 161 07/09/2015   HDL 44 (L) 07/09/2015   LDLCALC 98 07/09/2015   TRIG 95 07/09/2015   CHOLHDL 3.7 07/09/2015    She has been working on diet and exercise for prediabetes, and denies paresthesia of the feet, polydipsia, polyuria and visual disturbances. Last A1C in the office was:  Lab Results  Component Value Date   HGBA1C 5.3 03/23/2016   Patient is NOT on Vitamin D supplement.   Lab Results  Component Value Date   VD25OH 16 (L) 07/26/2016   BMI is Body mass index is 42.87 kg/m., she is working on diet and exercise. Contrave did not help, has been on belviq and phentermine without help. Has NOT been  keeping food diary, has been stressing with buying new house. Her wait measure 42 inches, goal is 40 inches next time.  Wt Readings from Last 3 Encounters:  06/14/17 230 lb 9.6 oz (104.6 kg)  01/24/17 230 lb 12.8 oz (104.7 kg)  10/26/16 230 lb (104.3 kg)    Current Medications:  Current Outpatient Medications on File Prior to Visit  Medication Sig Dispense Refill  . hydrochlorothiazide (MICROZIDE) 12.5 MG capsule Take 1 capsule (12.5 mg total) by mouth daily. 30 capsule 3  . ibuprofen (ADVIL,MOTRIN) 200 MG tablet Take 600 mg by mouth every 6 (six) hours as needed for mild pain.    . Lorcaserin HCl ER (BELVIQ XR) 20 MG TB24 Take 20 mg by mouth daily. 30 tablet 3  . methocarbamol (ROBAXIN) 500 MG tablet Take 1 tablet (500 mg total) by mouth every 8 (eight) hours as needed for muscle spasms. 60 tablet 0  . Multiple Vitamins-Minerals (MULTIVITAMIN GUMMIES ADULT PO) Take by mouth. DAILY    . naproxen (NAPROSYN) 500 MG tablet Take 1 tablet (500 mg total) by mouth 2 (two) times daily. 30 tablet 0  . SUMAtriptan (IMITREX) 100 MG tablet Take 1 tablet (100 mg total) by mouth once as needed for migraine. May repeat in 2 hours if headache persists or recurs. 6 tablet 2   No current facility-administered medications on file prior to visit.    Medical History:  Past Medical History:  Diagnosis Date  . Abnormal Pap smear   . Cervical  polyp   . Depression   . Headache(784.0)   . Migraines   . Morbid obesity ([place])   . Prediabetes   . Toxemia in pregnancy   . Toxemia in pregnancy   . Umbilical hernia    SOME ABDOMINAL DISCOMFORT  . UTI (urinary tract infection)    ON MEDICATION AS OF 6/10 FOR UTI - HX OF FREQUENT UTI'S  . Vitamin D deficiency    Allergies: No Known Allergies   Review of Systems:  Review of Systems  Constitutional: Negative.   HENT: Negative.   Eyes: Negative.   Respiratory: Negative.   Cardiovascular: Negative.   Gastrointestinal: Negative.   Genitourinary: Negative.    Musculoskeletal: Negative.   Skin: Negative.   Neurological: Negative.   Endo/Heme/Allergies: Negative.   Psychiatric/Behavioral: Negative.     Family history- Review and unchanged Social history- Review and unchanged Physical Exam: BP 124/76   Pulse (!) 106   Temp 97.6 F (36.4 C)   Resp 16   Ht 5' 1.5" (1.562 m)   Wt 230 lb 9.6 oz (104.6 kg)   SpO2 97%   BMI 42.87 kg/m  Wt Readings from Last 3 Encounters:  06/14/17 230 lb 9.6 oz (104.6 kg)  01/24/17 230 lb 12.8 oz (104.7 kg)  10/26/16 230 lb (104.3 kg)   General Appearance: Well nourished, in no apparent distress. Eyes: PERRLA, EOMs, conjunctiva no swelling or erythema Sinuses: No Frontal/maxillary tenderness ENT/Mouth: Ext aud canals clear, TMs without erythema, bulging. No erythema, swelling, or exudate on post pharynx.  Tonsils not swollen or erythematous. Hearing normal.  Neck: Supple, thyroid normal.  Respiratory: Respiratory effort normal, BS equal bilaterally without rales, rhonchi, wheezing or stridor.  Cardio: RRR with no MRGs. Brisk peripheral pulses without edema.  Abdomen: Soft, + BS,  Non tender, no guarding, rebound, hernias, masses. Lymphatics: Non tender without lymphadenopathy.  Musculoskeletal: Full ROM, 5/5 strength, Normal gait Skin: Warm, dry without rashes, lesions, ecchymosis.  Neuro: Cranial nerves intact. Normal muscle tone, no cerebellar symptoms. Psych: Awake and oriented X 3, normal affect, Insight and Judgment appropriate.    Vicie Mutters, PA-C 11:10 AM [facility]

## 2017-06-14 ENCOUNTER — Ambulatory Visit: Payer: BC Managed Care – PPO | Admitting: Physician Assistant

## 2017-06-14 ENCOUNTER — Encounter: Payer: Self-pay | Admitting: Physician Assistant

## 2017-06-14 VITALS — BP 124/76 | HR 106 | Temp 97.6°F | Resp 16 | Ht 61.5 in | Wt 230.6 lb

## 2017-06-14 DIAGNOSIS — Z79899 Other long term (current) drug therapy: Secondary | ICD-10-CM

## 2017-06-14 DIAGNOSIS — E559 Vitamin D deficiency, unspecified: Secondary | ICD-10-CM

## 2017-06-14 DIAGNOSIS — I10 Essential (primary) hypertension: Secondary | ICD-10-CM

## 2017-06-14 DIAGNOSIS — R7303 Prediabetes: Secondary | ICD-10-CM | POA: Diagnosis not present

## 2017-06-14 NOTE — Patient Instructions (Addendum)
Being dehydrated can hurt your kidneys, cause fatigue, headaches, muscle aches, joint pain, and dry skin/nails so please increase your fluids.   Drink 80-100 oz a day of water, measure it out! Slowly increase it every month.  Eat 3 meals a day, have to do breakfast, eat protein- hard boiled eggs, protein bar like nature valley protein bar, greek yogurt like oikos triple zero, chobani 100, or light n fit greek  Can check out plantnanny app on your phone to help you keep track of your water   PLEASE [place] PAL AND BRING IT WITH YOU NEXT TIME.    Vitamin D goal is between 60-80  Please make sure that you are taking your Vitamin D as directed.   It is very important as a natural anti-inflammatory   helping hair, skin, and nails, as well as reducing stroke and heart attack risk.   It helps your bones and helps with mood.  We want you on at least 5000 IU daily  It also decreases numerous cancer risks so please take it as directed.   Low Vit D is associated with a 200-300% higher risk for CANCER   and 200-300% higher risk for HEART   ATTACK  &  STROKE.    .....................................Marland Kitchen  It is also associated with higher death rate at younger ages,   autoimmune diseases like Rheumatoid arthritis, Lupus, Multiple Sclerosis.     Also many other serious conditions, like depression, Alzheimer's  Dementia, infertility, muscle aches, fatigue, fibromyalgia - just to name a few.  +++++++++++++++++++  Can get liquid vitamin D from [place] here in [place] at  [facility] alternatives [address] [place], [place] [address] Or you can try earth fare

## 2017-07-18 ENCOUNTER — Other Ambulatory Visit: Payer: Self-pay | Admitting: Internal Medicine

## 2017-07-18 DIAGNOSIS — Z1231 Encounter for screening mammogram for malignant neoplasm of breast: Secondary | ICD-10-CM

## 2017-08-05 ENCOUNTER — Ambulatory Visit
Admission: RE | Admit: 2017-08-05 | Discharge: 2017-08-05 | Disposition: A | Discharge status: Home / Self Care | Payer: BC Managed Care – PPO | Source: Ambulatory Visit | Attending: Internal Medicine | Admitting: Internal Medicine

## 2017-08-05 DIAGNOSIS — Z1231 Encounter for screening mammogram for malignant neoplasm of breast: Secondary | ICD-10-CM

## 2017-08-22 NOTE — Progress Notes (Signed)
Assessment and Plan:  Kendra Stein was seen today for headache, generalized body aches and sinus problem.  Diagnoses and all orders for this visit:  Acute nasopharyngitis (common cold) superimposed on allergic rhinoconjunctivitis - Discussed the importance of avoiding unnecessary antibiotic therapy. Suggested symptomatic OTC remedies. Nasal saline spray for congestion. Nasal steroids, allergy pill, oral steroids Follow up as needed. Get flu vaccine at pharmacy - n/a in office today -     predniSONE (DELTASONE) 20 MG tablet; 2 tablets daily for 3 days, 1 tablet daily for 4 days. -     promethazine-dextromethorphan (PROMETHAZINE-DM) 6.25-15 MG/5ML syrup; Take 5 mLs by mouth 4 (four) times daily as needed for cough.  Other orders        Fill and take only if develops fever, or increasingly productive cough -     azithromycin (ZITHROMAX) 250 MG tablet; Take 2 tablets (500 mg) on  Day 1,  followed by 1 tablet (250 mg) once daily on Days 2 through 5.   Further disposition pending results of labs. Discussed med's effects and SE's.   Over 15 minutes of exam, counseling, chart review, and critical decision making was performed.   Future Appointments  Date Time Provider [place]  10/12/2017 10:00 AM Vicie Mutters, PA-C [facility] None    ------------------------------------------------------------------------------------------------------------------   HPI BP 122/82   Pulse 96   Temp 98.1 F (36.7 C)   Ht 5' 1.5" (1.562 m)   Wt 239 lb (108.4 kg)   SpO2 98%   BMI 44.43 kg/m   41 y.o.female presents for mild headaches/sinus pressure, nasal congestion and chills, mild sore throat x 1 week. Was getting better but then has gotten worse - mainly nasal congestion and body aches. Now endorses mild wateriness of bilateral eyes. Endorses sneezing today. Endorses mild chills last night and some fatigue. No fevers at home.   Has taken tylenol cold and flu, theraflu, unknown OTC  decongestant, does not currently take any allergy medication  Has not had flu vaccination this season. She is a bus Geophysicist/field seismologist.    Past Medical History:  Diagnosis Date  . Abnormal Pap smear   . Cervical polyp   . Depression   . Headache(784.0)   . Migraines   . Morbid obesity ([place])   . Prediabetes   . Toxemia in pregnancy   . Toxemia in pregnancy   . Umbilical hernia    SOME ABDOMINAL DISCOMFORT  . UTI (urinary tract infection)    ON MEDICATION AS OF 6/10 FOR UTI - HX OF FREQUENT UTI'S  . Vitamin D deficiency      No Known Allergies  Current Outpatient Medications on File Prior to Visit  Medication Sig  . hydrochlorothiazide (MICROZIDE) 12.5 MG capsule Take 1 capsule (12.5 mg total) by mouth daily.  Marland Kitchen ibuprofen (ADVIL,MOTRIN) 200 MG tablet Take 600 mg by mouth every 6 (six) hours as needed for mild pain.  . Multiple Vitamins-Minerals (MULTIVITAMIN GUMMIES ADULT PO) Take by mouth. DAILY  . naproxen (NAPROSYN) 500 MG tablet Take 1 tablet (500 mg total) by mouth 2 (two) times daily.  . SUMAtriptan (IMITREX) 100 MG tablet Take 1 tablet (100 mg total) by mouth once as needed for migraine. May repeat in 2 hours if headache persists or recurs.  . Lorcaserin HCl ER (BELVIQ XR) 20 MG TB24 Take 20 mg by mouth daily. (Patient not taking: Reported on 08/23/2017)   No current facility-administered medications on file prior to visit.     ROS: all negative except above.  Physical Exam:  BP 122/82   Pulse 96   Temp 98.1 F (36.7 C)   Ht 5' 1.5" (1.562 m)   Wt 239 lb (108.4 kg)   SpO2 98%   BMI 44.43 kg/m   General Appearance: Well nourished, in no apparent distress. Eyes: PERRLA, EOMs, conjunctiva mildly injected, watery Sinuses: No Frontal/maxillary tenderness ENT/Mouth: Ext aud canals clear, TMs without erythema, bulging. No erythema, swelling, or exudate on post pharynx.  Tonsils not swollen or erythematous. Hearing normal.  Neck: Supple, thyroid normal.  Respiratory:  Respiratory effort normal, BS equal bilaterally without rales, rhonchi, wheezing or stridor.  Cardio: RRR with no MRGs. Brisk peripheral pulses without edema.  Abdomen: Soft, + BS.  Non tender, no guarding, rebound, hernias, masses. Lymphatics: Non tender without lymphadenopathy.  Musculoskeletal: Full ROM, 5/5 strength, normal gait.  Skin: Warm, dry without rashes, lesions, ecchymosis.  Neuro: Cranial nerves intact. Normal muscle tone, no cerebellar symptoms.   Psych: Awake and oriented X 3, normal affect, Insight and Judgment appropriate.     Izora Ribas, NP 2:30 PM [facility]

## 2017-08-23 ENCOUNTER — Encounter: Payer: Self-pay | Admitting: Adult Health

## 2017-08-23 ENCOUNTER — Ambulatory Visit: Payer: BC Managed Care – PPO | Admitting: Adult Health

## 2017-08-23 VITALS — BP 122/82 | HR 96 | Temp 98.1°F | Ht 61.5 in | Wt 239.0 lb

## 2017-08-23 DIAGNOSIS — J309 Allergic rhinitis, unspecified: Secondary | ICD-10-CM

## 2017-08-23 DIAGNOSIS — H1010 Acute atopic conjunctivitis, unspecified eye: Secondary | ICD-10-CM | POA: Diagnosis not present

## 2017-08-23 DIAGNOSIS — J00 Acute nasopharyngitis [common cold]: Secondary | ICD-10-CM

## 2017-08-23 MED ORDER — METHOCARBAMOL 500 MG PO TABS: 500.0000 mg | ORAL_TABLET | Freq: Three times a day (TID) | ORAL | 0 refills | Status: DC | PRN

## 2017-08-23 MED ORDER — NAPROXEN 500 MG PO TABS: 500.0000 mg | ORAL_TABLET | Freq: Two times a day (BID) | ORAL | 0 refills | Status: DC

## 2017-08-23 MED ORDER — PROMETHAZINE-DM 6.25-15 MG/5ML PO SYRP: 5.0000 mL | ORAL_SOLUTION | Freq: Four times a day (QID) | ORAL | 1 refills | Status: DC | PRN

## 2017-08-23 MED ORDER — AZITHROMYCIN 250 MG PO TABS: ORAL_TABLET | ORAL | 1 refills | Status: AC

## 2017-08-23 MED ORDER — PREDNISONE 20 MG PO TABS: ORAL_TABLET | ORAL | 0 refills | Status: DC

## 2017-08-23 NOTE — Patient Instructions (Signed)

## 2017-08-26 ENCOUNTER — Encounter: Payer: Self-pay | Admitting: Physician Assistant

## 2017-08-26 MED ORDER — PHENTERMINE HCL 37.5 MG PO TABS: 37.5000 mg | ORAL_TABLET | Freq: Every day | ORAL | 1 refills | Status: DC

## 2017-10-10 NOTE — Progress Notes (Signed)
Complete Physical  Assessment and Plan:  Essential hypertension - continue medications, DASH diet, exercise and monitor at home. Call if greater than 130/80.  -     CBC with Differential/Platelet -     BASIC METABOLIC PANEL WITH GFR -     Hepatic function panel -     TSH -     Urinalysis, Routine w reflex microscopic -     Microalbumin / creatinine urine ratio -     EKG 12-Lead  Morbid obesity (HCC) - follow up 3 months for progress monitoring - increase veggies, decrease carbs - long discussion about weight loss, diet, and exercise  Medication management -     Magnesium  Vitamin D deficiency -     VITAMIN D 25 Hydroxy (Vit-D Deficiency, Fractures)  Migraine without status migrainosus, not intractable, unspecified migraine type Controlled, avoid triggers  BMI 40.0-44.9, adult (HCC) Discussed disease progression and risks Discussed diet/exercise, weight management and risk modification  Screening cholesterol level -     Lipid panel  Cervical cancer screening -     Cytology - PAP  Encounter for general adult medical examination with abnormal findings 1 year  Screening for HIV without presence of risk factors -     HIV antibody   Discussed med's effects and SE's. Screening labs and tests as requested with regular follow-up as recommended. Over 40 minutes of exam, counseling, chart review, and complex, high level critical decision making was performed this visit.   HPI  42 y.o. female  presents for a complete physical.  Her blood pressure has been controlled at home, takes the HCTZ as needed, today their BP is BP: 120/80 She does workout. She denies chest pain, shortness of breath, dizziness.  She is not on cholesterol medication and denies myalgias. Her cholesterol is at goal. The cholesterol last visit was:   Lab Results  Component Value Date   CHOL 161 07/09/2015   HDL 44 (L) 07/09/2015   LDLCALC 98 07/09/2015   TRIG 95 07/09/2015   CHOLHDL 3.7 07/09/2015    She has been working on diet and exercise for prediabetes,  and denies paresthesia of the feet, polydipsia, polyuria and visual disturbances. Last A1C in the office was:  Lab Results  Component Value Date   HGBA1C 5.3 03/23/2016   Patient is on Vitamin D supplement.   Lab Results  Component Value Date   VD25OH 16 (L) 07/26/2016     Has not been having as many headaches with eating better and more consistently.  BMI is Body mass index is 43.61 kg/m., she is working on diet and exercise. He weight is down, she has been taking phentermine x 1 month. She has been cutting back on eating out.  Wt Readings from Last 3 Encounters:  10/12/17 234 lb 9.6 oz (106.4 kg)  08/23/17 239 lb (108.4 kg)  06/14/17 230 lb 9.6 oz (104.6 kg)   Current Medications:  Current Outpatient Medications on File Prior to Visit  Medication Sig Dispense Refill  . hydrochlorothiazide (MICROZIDE) 12.5 MG capsule Take 1 capsule (12.5 mg total) by mouth daily. 30 capsule 3  . ibuprofen (ADVIL,MOTRIN) 200 MG tablet Take 600 mg by mouth every 6 (six) hours as needed for mild pain.    . methocarbamol (ROBAXIN) 500 MG tablet Take 1 tablet (500 mg total) by mouth every 8 (eight) hours as needed for muscle spasms. 60 tablet 0  . Multiple Vitamins-Minerals (MULTIVITAMIN GUMMIES ADULT PO) Take by mouth. DAILY    .  naproxen (NAPROSYN) 500 MG tablet Take 1 tablet (500 mg total) by mouth 2 (two) times daily. 30 tablet 0  . phentermine (ADIPEX-P) 37.5 MG tablet Take 1 tablet (37.5 mg total) by mouth daily before breakfast. 30 tablet 1  . SUMAtriptan (IMITREX) 100 MG tablet Take 1 tablet (100 mg total) by mouth once as needed for migraine. May repeat in 2 hours if headache persists or recurs. 6 tablet 2   No current facility-administered medications on file prior to visit.    Health Maintenance:   Immunization History  Administered Date(s) Administered  . Influenza, Seasonal, Injecte, Preservative Fre 07/26/2016  . Tdap  01/10/2011, 08/07/2016   Tetanus: 2018 Pneumovax: N/A Prevnar 13: N/A Flu vaccine: 2018 Zostavax:N/A  LMP: s/p ablation Pap: 2015 DUE, has not followed up with Dr. Mancel Bale due to billing issues.  MGM: 07/2017 DEXA: N/A Colonoscopy: N/A EGD: N/A MRI brain 2006 [place] AB 2015 CXR 2017  Patient Care Team: Unk Pinto, MD as PCP - General (Internal Medicine) Everett Graff, MD as Consulting Physician (Obstetrics and Gynecology)  Medical History:  Past Medical History:  Diagnosis Date  . Abnormal Pap smear   . Cervical polyp   . Depression   . Headache(784.0)   . Migraines   . Morbid obesity ([place])   . Prediabetes   . Toxemia in pregnancy   . Toxemia in pregnancy   . Umbilical hernia    SOME ABDOMINAL DISCOMFORT  . UTI (urinary tract infection)    ON MEDICATION AS OF 6/10 FOR UTI - HX OF FREQUENT UTI'S  . Vitamin D deficiency    Allergies No Known Allergies  SURGICAL HISTORY She  has a past surgical history that includes Cryotherapy; Endometrial ablation; Tubal ligation; Wisdom tooth extraction; Cesarean section; and Umbilical hernia repair (N/A, 12/27/2013). FAMILY HISTORY Her family history includes Arthritis in her mother; Asthma in her daughter; Breast cancer in her maternal aunt; Hypertension in her brother and mother. SOCIAL HISTORY She  reports that she has never smoked. She has never used smokeless tobacco. She reports that she drinks alcohol. She reports that she does not use drugs.   Review of Systems: Review of Systems  Constitutional: Negative.   HENT: Negative.   Eyes: Negative.   Respiratory: Negative.   Cardiovascular: Negative.   Gastrointestinal: Negative.   Genitourinary: Negative.   Musculoskeletal: Negative.   Skin: Negative.   Neurological: Negative.   Endo/Heme/Allergies: Negative.   Psychiatric/Behavioral: Negative.     Physical Exam: Estimated body mass index is 43.61 kg/m as calculated from the following:   Height as of this  encounter: 5' 1.5" (1.562 m).   Weight as of this encounter: 234 lb 9.6 oz (106.4 kg). BP 120/80   Pulse 87   Temp (!) 97.3 F (36.3 C)   Resp 16   Ht 5' 1.5" (1.562 m)   Wt 234 lb 9.6 oz (106.4 kg)   SpO2 97%   BMI 43.61 kg/m    General Appearance: Well nourished, in no apparent distress.  Eyes: PERRLA, EOMs, conjunctiva no swelling or erythema, normal fundi and vessels.  Sinuses: No Frontal/maxillary tenderness  ENT/Mouth: Ext aud canals clear, normal light reflex with TMs without erythema, bulging. Good dentition. No erythema, swelling, or exudate on post pharynx. Tonsils not swollen or erythematous. Hearing normal.  Neck: Supple, thyroid normal. No bruits  Respiratory: Respiratory effort normal, BS equal bilaterally without rales, rhonchi, wheezing or stridor.  Cardio: RRR without murmurs, rubs or gallops. Brisk peripheral pulses without edema.  Chest: symmetric, with normal excursions and percussion.  Breasts: defer OB/GYN Abdomen: Soft, nontender, no guarding, rebound, hernias, masses, or organomegaly.  Lymphatics: Non tender without lymphadenopathy.  Genitourinary: defer OB/GYN Musculoskeletal: Full ROM all peripheral extremities,5/5 strength, and normal gait.  Skin: Warm, dry without rashes, lesions, ecchymosis. Neuro: Cranial nerves intact, reflexes equal bilaterally. Normal muscle tone, no cerebellar symptoms. Sensation intact.  Psych: Awake and oriented X 3, normal affect, Insight and Judgment appropriate.   EKG: will get this year AORTA SCAN: defer  Vicie Mutters 10:08 AM [facility]

## 2017-10-12 ENCOUNTER — Encounter: Payer: Self-pay | Admitting: Physician Assistant

## 2017-10-12 ENCOUNTER — Other Ambulatory Visit (HOSPITAL_COMMUNITY)
Admission: RE | Admit: 2017-10-12 | Discharge: 2017-10-12 | Disposition: A | Discharge status: Home / Self Care | Payer: BC Managed Care – PPO | Source: Ambulatory Visit | Attending: Physician Assistant | Admitting: Physician Assistant

## 2017-10-12 ENCOUNTER — Ambulatory Visit: Payer: BC Managed Care – PPO | Admitting: Physician Assistant

## 2017-10-12 VITALS — BP 120/80 | HR 87 | Temp 97.3°F | Resp 16 | Ht 61.5 in | Wt 234.6 lb

## 2017-10-12 DIAGNOSIS — Z0000 Encounter for general adult medical examination without abnormal findings: Secondary | ICD-10-CM

## 2017-10-12 DIAGNOSIS — E559 Vitamin D deficiency, unspecified: Secondary | ICD-10-CM | POA: Diagnosis not present

## 2017-10-12 DIAGNOSIS — G43909 Migraine, unspecified, not intractable, without status migrainosus: Secondary | ICD-10-CM

## 2017-10-12 DIAGNOSIS — I10 Essential (primary) hypertension: Secondary | ICD-10-CM

## 2017-10-12 DIAGNOSIS — Z124 Encounter for screening for malignant neoplasm of cervix: Secondary | ICD-10-CM | POA: Insufficient documentation

## 2017-10-12 DIAGNOSIS — Z136 Encounter for screening for cardiovascular disorders: Secondary | ICD-10-CM

## 2017-10-12 DIAGNOSIS — Z114 Encounter for screening for human immunodeficiency virus [HIV]: Secondary | ICD-10-CM | POA: Diagnosis not present

## 2017-10-12 DIAGNOSIS — Z6841 Body Mass Index (BMI) 40.0 and over, adult: Secondary | ICD-10-CM

## 2017-10-12 DIAGNOSIS — Z1389 Encounter for screening for other disorder: Secondary | ICD-10-CM | POA: Diagnosis not present

## 2017-10-12 DIAGNOSIS — Z13220 Encounter for screening for lipoid disorders: Secondary | ICD-10-CM

## 2017-10-12 DIAGNOSIS — Z79899 Other long term (current) drug therapy: Secondary | ICD-10-CM

## 2017-10-12 DIAGNOSIS — Z0001 Encounter for general adult medical examination with abnormal findings: Secondary | ICD-10-CM

## 2017-10-12 DIAGNOSIS — Z1329 Encounter for screening for other suspected endocrine disorder: Secondary | ICD-10-CM

## 2017-10-12 NOTE — Patient Instructions (Addendum)
Check out  Mini habits for weight loss book  Veggies are great because you can eat a ton! They are low in calories, great to fill you up, and have a ton of vitamins, minerals, and protein.          When it comes to diets, agreement about the perfect plan isn't easy to find, even among the experts. Experts at the [employer] developed an idea known as the Healthy Eating Plate. Just imagine a plate divided into logical, healthy portions.  The emphasis is on diet quality:  Load up on vegetables and fruits - one-half of your plate: Aim for color and variety, and remember that potatoes don't count.  Go for whole grains - one-quarter of your plate: Whole wheat, barley, wheat berries, quinoa, oats, brown rice, and foods made with them. If you want pasta, go with whole wheat pasta.  Protein power - one-quarter of your plate: Fish, chicken, beans, and nuts are all healthy, versatile protein sources. Limit red meat.  The diet, however, does go beyond the plate, offering a few other suggestions.  Use healthy plant oils, such as olive, canola, soy, corn, sunflower and peanut. Check the labels, and avoid partially hydrogenated oil, which have unhealthy trans fats.  If you're thirsty, drink water. Coffee and tea are good in moderation, but skip sugary drinks and limit milk and dairy products to one or two daily servings.  The type of carbohydrate in the diet is more important than the amount. Some sources of carbohydrates, such as vegetables, fruits, whole grains, and beans-are healthier than others.  Finally, stay active.

## 2017-10-13 LAB — CBC WITH DIFFERENTIAL/PLATELET
Basophils Absolute: 18 cells/uL (ref 0–200)
Basophils Relative: 0.2 %
EOS PCT: 0.4 %
Eosinophils Absolute: 37 cells/uL (ref 15–500)
HEMATOCRIT: 38.6 % (ref 35.0–45.0)
Hemoglobin: 12.9 g/dL (ref 11.7–15.5)
LYMPHS ABS: 2098 {cells}/uL (ref 850–3900)
MCH: 26.4 pg — ABNORMAL LOW (ref 27.0–33.0)
MCHC: 33.4 g/dL (ref 32.0–36.0)
MCV: 79.1 fL — ABNORMAL LOW (ref 80.0–100.0)
MPV: 9.6 fL (ref 7.5–12.5)
Monocytes Relative: 6.5 %
NEUTROS ABS: 6449 {cells}/uL (ref 1500–7800)
NEUTROS PCT: 70.1 %
PLATELETS: 318 10*3/uL (ref 140–400)
RBC: 4.88 10*6/uL (ref 3.80–5.10)
RDW: 13.5 % (ref 11.0–15.0)
Total Lymphocyte: 22.8 %
WBC mixed population: 598 cells/uL (ref 200–950)
WBC: 9.2 10*3/uL (ref 3.8–10.8)

## 2017-10-13 LAB — BASIC METABOLIC PANEL WITH GFR
BUN: 8 mg/dL (ref 7–25)
CALCIUM: 9.1 mg/dL (ref 8.6–10.2)
CHLORIDE: 105 mmol/L (ref 98–110)
CO2: 26 mmol/L (ref 20–32)
Creat: 0.77 mg/dL (ref 0.50–1.10)
GFR, EST NON AFRICAN AMERICAN: 96 mL/min/{1.73_m2} (ref >=60)
GFR, Est African American: 111 mL/min/{1.73_m2} (ref >=60)
Glucose, Bld: 94 mg/dL (ref 65–99)
POTASSIUM: 4 mmol/L (ref 3.5–5.3)
SODIUM: 138 mmol/L (ref 135–146)

## 2017-10-13 LAB — HEPATIC FUNCTION PANEL
AG Ratio: 1.4 (calc) (ref 1.0–2.5)
ALBUMIN MSPROF: 4.2 g/dL (ref 3.6–5.1)
ALKALINE PHOSPHATASE (APISO): 72 U/L (ref 33–115)
ALT: 15 U/L (ref 6–29)
AST: 15 U/L (ref 10–30)
Bilirubin, Direct: 0.1 mg/dL (ref 0.0–0.2)
Globulin: 3 g/dL (calc) (ref 1.9–3.7)
Indirect Bilirubin: 0.4 mg/dL (calc) (ref 0.2–1.2)
TOTAL PROTEIN: 7.2 g/dL (ref 6.1–8.1)
Total Bilirubin: 0.5 mg/dL (ref 0.2–1.2)

## 2017-10-13 LAB — LIPID PANEL
Cholesterol: 201 mg/dL — ABNORMAL HIGH (ref <200)
HDL: 49 mg/dL — AB (ref >50)
LDL Cholesterol (Calc): 130 mg/dL (calc) — ABNORMAL HIGH
Non-HDL Cholesterol (Calc): 152 mg/dL (calc) — ABNORMAL HIGH (ref <130)
TRIGLYCERIDES: 117 mg/dL (ref <150)
Total CHOL/HDL Ratio: 4.1 (calc) (ref <5.0)

## 2017-10-13 LAB — URINALYSIS, ROUTINE W REFLEX MICROSCOPIC
BILIRUBIN URINE: NEGATIVE
GLUCOSE, UA: NEGATIVE
Hgb urine dipstick: NEGATIVE
Ketones, ur: NEGATIVE
Leukocytes, UA: NEGATIVE
Nitrite: NEGATIVE
PH: 5.5 (ref 5.0–8.0)
Protein, ur: NEGATIVE
SPECIFIC GRAVITY, URINE: 1.013 (ref 1.001–1.03)

## 2017-10-13 LAB — MICROALBUMIN / CREATININE URINE RATIO
Creatinine, Urine: 119 mg/dL (ref 20–275)
Microalb Creat Ratio: 8 mcg/mg creat (ref <30)
Microalb, Ur: 0.9 mg/dL

## 2017-10-13 LAB — MAGNESIUM: Magnesium: 2 mg/dL (ref 1.5–2.5)

## 2017-10-13 LAB — TSH: TSH: 1.07 m[IU]/L

## 2017-10-13 LAB — HIV ANTIBODY (ROUTINE TESTING W REFLEX): HIV: NONREACTIVE

## 2017-10-13 LAB — VITAMIN D 25 HYDROXY (VIT D DEFICIENCY, FRACTURES): Vit D, 25-Hydroxy: 36 ng/mL (ref 30–100)

## 2017-10-14 LAB — CYTOLOGY - PAP
Diagnosis: NEGATIVE
HPV: NOT DETECTED

## 2017-11-07 ENCOUNTER — Other Ambulatory Visit: Payer: Self-pay | Admitting: Physician Assistant

## 2018-01-19 DIAGNOSIS — E785 Hyperlipidemia, unspecified: Secondary | ICD-10-CM | POA: Insufficient documentation

## 2018-01-19 NOTE — Progress Notes (Signed)
FOLLOW UP  Assessment and Plan:   Hypertension Well controlled with current medications  Monitor blood pressure at home; patient to call if consistently greater than 130/80 Continue DASH diet.   Reminder to go to the ER if any CP, SOB, nausea, dizziness, severe HA, changes vision/speech, left arm numbness and tingling and jaw pain.  Cholesterol Currently above goal, treated by lifestyle only Continue low cholesterol diet and exercise.  Defer lipid panel to next visit - will be aggressively working on lifesytle  Obesity with co morbidities Long discussion about weight loss, diet, and exercise Recommended diet heavy in fruits and veggies and low in animal meats, cheeses, and dairy products, appropriate calorie intake Discussed ideal weight for height and initial weight goal (229lb) Patient will work on tracking calories, get on weight watchers or similar, start back at [facility] Patient on phentermine with benefit and no SE, taking drug breaks; continue close follow up. Will restart wellbutrin 150 mg  Will follow up in 3 months  Vitamin D Def Below goal at last visit; she has initiated supplement , taking 5000-10000 IU daily Discussed goal of 70-100 Defer Vit D level  Defer labs as we have opted to follow up in 3 months.   Continue diet and meds as discussed. Further disposition pending results of labs. Discussed med's effects and SE's.   Over 30 minutes of exam, counseling, chart review, and critical decision making was performed.   Future Appointments  Date Time Provider [place]  10/16/2018 10:00 AM Vicie Mutters, PA-C [facility] None    ----------------------------------------------------------------------------------------------------------------------  HPI 41 y.o. female  presents for 3 month follow up on hypertension, cholesterol, glucose management, morbid obesity and vitamin D deficiency.   she is prescribed phentermine for weight loss.  While on the medication  they have lost 0 lbs since last visit. They deny palpitations, anxiety, trouble sleeping, elevated BP.   BMI is Body mass index is 43.5 kg/m., she is working on diet and exercise. 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 Tracking app) previously and was able to lose 15 lb on this. She is a bit frustrated with not seeing progress at this time. She mainly struggles with not being sure as to what to eat. Discussed at length today, and she feels she would do best with a pre-planned program with meal plan or similar that will simplify diet.   Her blood pressure has been controlled at home, today their BP is BP: 128/86  She does not workout. She denies chest pain, shortness of breath, dizziness.   She is not on cholesterol medication and denies myalgias. Her cholesterol is not at goal. The cholesterol last visit was:   Lab Results  Component Value Date   CHOL 201 (H) 10/12/2017   HDL 49 (L) 10/12/2017   LDLCALC 130 (H) 10/12/2017   TRIG 117 10/12/2017   CHOLHDL 4.1 10/12/2017    She has been working on diet and exercise for glucose management, and denies foot  ulcerations, increased appetite, nausea, paresthesia of the feet, polydipsia, polyuria, visual disturbances, vomiting and weight loss. Last A1C in the office was:  Lab Results  Component Value Date   HGBA1C 5.3 03/23/2016   Patient is on Vitamin D supplement.   Lab Results  Component Value Date   VD25OH 36 10/12/2017        Current Medications:  Current Outpatient Medications on File Prior to Visit   Medication Sig  . ibuprofen (ADVIL,MOTRIN) 200 MG tablet Take 600 mg by mouth every 6 (six) hours as needed for mild pain.  . methocarbamol (ROBAXIN) 500 MG tablet Take 1 tablet (500 mg total) by mouth every 8 (eight) hours as needed for muscle spasms.  . Multiple Vitamins-Minerals (MULTIVITAMIN GUMMIES ADULT PO) Take by mouth. DAILY  . naproxen (NAPROSYN) 500 MG tablet Take 1 tablet (500 mg total) by mouth 2 (two) times daily.  . phentermine (ADIPEX-P) 37.5 MG tablet TAKE ONE TABLET BY MOUTH DAILY BEFORE BREAKFAST  . SUMAtriptan (IMITREX) 100 MG tablet Take 1 tablet (100 mg total) by mouth once as needed for migraine. May repeat in 2 hours if headache persists or recurs.  . hydrochlorothiazide (MICROZIDE) 12.5 MG capsule Take 1 capsule (12.5 mg total) by mouth daily. (Patient not taking: Reported on 01/20/2018)   No current facility-administered medications on file prior to visit.      Allergies: No Known Allergies   Medical History:  Past Medical History:  Diagnosis Date  . Abnormal Pap smear   . Cervical polyp   . Depression   . Headache(784.0)   . Migraines   . Morbid obesity ([place])   . Prediabetes   . Toxemia in pregnancy   . Toxemia in pregnancy   . Umbilical hernia    SOME ABDOMINAL DISCOMFORT  . UTI (urinary tract infection)    ON MEDICATION AS OF 6/10 FOR UTI - HX OF FREQUENT UTI'S  . Vitamin D deficiency    Family history- Reviewed and unchanged Social history- Reviewed and unchanged   Review of Systems:  Review of Systems  Constitutional: Negative for malaise/fatigue and weight loss.  HENT: Negative for hearing loss and tinnitus.   Eyes: Negative for blurred vision and double vision.  Respiratory: Negative for cough, shortness of breath and wheezing.   Cardiovascular: Negative for chest pain, palpitations, orthopnea, claudication and leg swelling.  Gastrointestinal: Negative for abdominal pain, blood in stool, constipation, diarrhea, heartburn, melena, nausea  and vomiting.  Genitourinary: Negative.   Musculoskeletal: Negative for joint pain and myalgias.  Skin: Negative for rash.  Neurological: Negative for dizziness, tingling, sensory change, weakness and headaches.  Endo/Heme/Allergies: Negative for polydipsia.  Psychiatric/Behavioral: Negative.   All other systems reviewed and are negative.     Physical Exam: BP 128/86   Pulse 88   Temp 97.6 F (36.4 C)   Ht 5' 1.5" (1.562 m)   Wt 234 lb (106.1 kg)   SpO2 98%   BMI 43.50 kg/m  Wt Readings from Last 3 Encounters:  01/20/18 234 lb (106.1 kg)  10/12/17 234 lb 9.6 oz (106.4 kg)  08/23/17 239 lb (108.4 kg)   General Appearance: Well nourished, in no apparent distress. Eyes: PERRLA, EOMs, conjunctiva no swelling or erythema Sinuses: No Frontal/maxillary tenderness ENT/Mouth: Ext aud canals clear, TMs without erythema, bulging. No erythema, swelling, or exudate on post pharynx.  Tonsils not swollen or erythematous. Hearing normal.  Neck: Supple, thyroid normal.  Respiratory: Respiratory effort normal, BS equal bilaterally without rales, rhonchi, wheezing or stridor.  Cardio: RRR with no MRGs. Brisk peripheral pulses without edema.  Abdomen: Soft, + BS.  Non tender, no guarding, rebound, hernias, masses. Lymphatics: Non tender without lymphadenopathy.  Musculoskeletal: Full ROM, 5/5 strength, Normal gait Skin: Warm, dry without rashes, lesions, ecchymosis.  Neuro: Cranial nerves intact. No cerebellar symptoms.  Psych: Awake and oriented X 3, normal affect, Insight and Judgment appropriate.    Izora Ribas, NP 10:13 AM [facility]

## 2018-01-20 ENCOUNTER — Ambulatory Visit: Payer: BC Managed Care – PPO | Admitting: Adult Health

## 2018-01-20 ENCOUNTER — Encounter: Payer: Self-pay | Admitting: Adult Health

## 2018-01-20 VITALS — BP 128/86 | HR 88 | Temp 97.6°F | Ht 61.5 in | Wt 234.0 lb

## 2018-01-20 DIAGNOSIS — Z79899 Other long term (current) drug therapy: Secondary | ICD-10-CM

## 2018-01-20 DIAGNOSIS — I10 Essential (primary) hypertension: Secondary | ICD-10-CM | POA: Diagnosis not present

## 2018-01-20 DIAGNOSIS — R7309 Other abnormal glucose: Secondary | ICD-10-CM | POA: Diagnosis not present

## 2018-01-20 DIAGNOSIS — E782 Mixed hyperlipidemia: Secondary | ICD-10-CM | POA: Diagnosis not present

## 2018-01-20 DIAGNOSIS — E559 Vitamin D deficiency, unspecified: Secondary | ICD-10-CM

## 2018-01-20 MED ORDER — BUPROPION HCL ER (XL) 150 MG PO TB24
150.0000 mg | ORAL_TABLET | Freq: Every day | ORAL | 1 refills | Status: DC
Start: 2018-01-20 — End: 2018-04-27

## 2018-01-20 MED ORDER — HYDROCHLOROTHIAZIDE 12.5 MG PO CAPS: 12.5000 mg | ORAL_CAPSULE | Freq: Every day | ORAL | 1 refills | Status: DC

## 2018-01-20 NOTE — Patient Instructions (Addendum)
Try lose it! Or MyFitnessPAL app tracking for 2-4 weeks    Look into getting on weight watchers or other food program -        Aim for 7+ servings of fruits and vegetables daily  80+ fluid ounces of water or unsweet tea for healthy kidneys  Limit to max 1 drink of alcohol per day  Limit animal fats in diet for cholesterol and heart health - choose grass fed whenever available  Aim for low stress - take time to unwind and care for your mental health  Aim for 150 min of moderate intensity exercise weekly for heart health, and weights twice weekly for bone health  Aim for 7-9 hours of sleep daily    Drink 1/2 your body weight in fluid ounces of water daily; drink a tall glass of water 30 min before meals  Don't eat until you're stuffed- listen to your stomach and eat until you are 80% full   Try eating off of a salad plate; wait 10 min after finishing before going back for seconds  Start by eating the vegetables on your plate; aim for 50% of your meals to be fruits or vegetables  Then eat your protein - lean meats (grass fed if possible), fish, beans, nuts in moderation  Eat your carbs/starch last ONLY if you still are hungry. If you can, stop before finishing it all  Avoid sugar and flour - the closer it looks to it's original form in nature, typically the better it is for you  Splurge in moderation - "assign" days when you get to splurge and have the "bad stuff" - I like to follow a 80% - 20% plan- "good" choices 80 % of the time, "bad" choices in moderation 20% of the time  Simple equation is: Calories out > calories in = weight loss - even if you eat the bad stuff, if you limit portions, you will still lose weight       When it comes to diets, agreement about the perfect plan isn't easy to find, even among the experts. Experts at the [employer] developed an idea known as the Healthy Eating Plate. Just imagine a plate divided into logical,  healthy portions.  The emphasis is on diet quality:  Load up on vegetables and fruits - one-half of your plate: Aim for color and variety, and remember that potatoes don't count.  Go for whole grains - one-quarter of your plate: Whole wheat, barley, wheat berries, quinoa, oats, brown rice, and foods made with them. If you want pasta, go with whole wheat pasta.  Protein power - one-quarter of your plate: Fish, chicken, beans, and nuts are all healthy, versatile protein sources. Limit red meat.  The diet, however, does go beyond the plate, offering a few other suggestions.  Use healthy plant oils, such as olive, canola, soy, corn, sunflower and peanut. Check the labels, and avoid partially hydrogenated oil, which have unhealthy trans fats.  If you're thirsty, drink water. Coffee and tea are good in moderation, but skip sugary drinks and limit milk and dairy products to one or two daily servings.  The type of carbohydrate in the diet is more important than the amount. Some sources of carbohydrates, such as vegetables, fruits, whole grains, and beans-are healthier than others.  Finally, stay active.

## 2018-04-26 NOTE — Progress Notes (Signed)
FOLLOW UP  Assessment and Plan:   Hypertension Well controlled with current medications  Monitor blood pressure at home; patient to call if consistently greater than 130/80 Continue DASH diet.   Reminder to go to the ER if any CP, SOB, nausea, dizziness, severe HA, changes vision/speech, left arm numbness and tingling and jaw pain.  Cholesterol Currently above goal, treated by lifestyle only Continue low cholesterol diet and exercise.  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 Will follow up in 3 months  Vitamin D Def Below goal at last visit; she has initiated supplement , taking 5000-10000 IU daily Discussed goal of 70-100 Check Vit D level  Defer labs as we have opted to follow up in 3 months.   Continue diet and meds as discussed. Further disposition pending results of labs. Discussed med's effects and SE's.   Over 30 minutes of exam, counseling, chart review, and critical decision making was performed.   Future Appointments  Date Time Provider [place]  04/27/2018 10:30 AM Liane Comber, NP [facility] None  10/16/2018 10:00 AM Vicie Mutters, PA-C [facility] None    ----------------------------------------------------------------------------------------------------------------------  HPI 42 y.o. female  presents for 3 month follow up on hypertension, cholesterol, glucose management, morbid obesity and vitamin D deficiency.   she is prescribed phentermine (taking occasionally only) and wellbutrin for weight loss.   While on the medication they have lost 1 lbs since last visit. (she reports she lost 11 lb on home scale then in the past 2 weeks seems to have gained 6 lb back- she is trying not to be discouraged). They deny palpitations, anxiety, trouble sleeping, elevated BP.   BMI is Body mass index is 43.39 kg/m., she is working on diet and exercise, plans to start going to the gym now that it is getting cooler. She is pushing water intake, has timer set to drink every 2 hours. Has 2 eggs or oatmeal and pieces of fruit, has celery/carrots and grapes for snacks, if she eats out she will get a side salad with protein.  Wt Readings from Last 3 Encounters:  04/27/18 233 lb 6.4 oz (105.9 kg)  01/20/18 234 lb (106.1 kg)  10/12/17 234 lb 9.6 oz (106.4 kg)   She reports she has been on contrave (+ lose it! Tracking app) previously and was able to lose 15 lb on this.   Her blood pressure has been controlled at home, today their BP is BP: 130/86  She does workout. She denies chest pain, shortness of breath, dizziness.   She is not on cholesterol medication and denies myalgias. Her cholesterol is not at goal. The cholesterol last visit was:   Lab Results  Component Value Date   CHOL 201 (H) 10/12/2017   HDL 49 (L) 10/12/2017   LDLCALC 130 (H) 10/12/2017   TRIG 117 10/12/2017   CHOLHDL 4.1 10/12/2017    She has been working on diet and exercise for glucose management, and denies foot ulcerations, increased appetite, nausea, paresthesia of the feet, polydipsia, polyuria, visual disturbances, vomiting and weight loss. Last A1C in the office was:  Lab Results  Component Value Date   HGBA1C 5.3 03/23/2016   Patient is on Vitamin D supplement.   Lab Results  Component Value Date   VD25OH 36 10/12/2017       Current Medications:  Current Outpatient Medications on File Prior to Visit  Medication Sig  . Cholecalciferol 5000 units TABS Take 5,000 Units by mouth daily.  Marland Kitchen ibuprofen (ADVIL,MOTRIN) 200 MG tablet  Take 600 mg by mouth every 6 (six) hours as needed for mild pain.  . methocarbamol (ROBAXIN) 500 MG tablet Take 1 tablet (500 mg total) by mouth every 8 (eight) hours as needed for muscle spasms.  . Multiple Vitamins-Minerals (MULTIVITAMIN GUMMIES ADULT PO) Take by mouth. DAILY  . naproxen (NAPROSYN) 500 MG tablet Take 1 tablet (500 mg total) by mouth 2 (two) times daily. (Patient taking differently: Take 500 mg by mouth as needed. )  . phentermine (ADIPEX-P) 37.5 MG tablet TAKE ONE TABLET BY MOUTH DAILY BEFORE BREAKFAST  . SUMAtriptan (IMITREX) 100 MG tablet Take 1 tablet (100 mg total) by mouth once as needed for migraine. May repeat in 2 hours if headache persists or recurs.   No current facility-administered medications on file prior to visit.      Allergies: No Known Allergies   Medical History:  Past Medical History:  Diagnosis Date  . Abnormal Pap smear   . Cervical polyp   . Depression   . Headache(784.0)   . Migraines   . Morbid obesity ([place])   . Prediabetes   . Toxemia in pregnancy   . Toxemia in pregnancy   . Umbilical hernia    SOME ABDOMINAL DISCOMFORT  . UTI (urinary tract infection)    ON MEDICATION AS OF 6/10 FOR UTI - HX OF FREQUENT UTI'S  . Vitamin D deficiency    Family history- Reviewed and unchanged Social history- Reviewed and unchanged   Review of Systems:  Review of Systems  Constitutional: Negative for malaise/fatigue and weight loss.  HENT: Negative for hearing loss and tinnitus.   Eyes: Negative for blurred vision and double vision.  Respiratory: Negative for cough, shortness of breath and wheezing.   Cardiovascular: Negative for chest pain, palpitations, orthopnea, claudication and leg swelling.  Gastrointestinal: Negative for abdominal pain, blood in stool, constipation, diarrhea, heartburn, melena, nausea and vomiting.  Genitourinary: Negative.   Musculoskeletal: Negative for joint pain and myalgias.  Skin: Negative for rash.  Neurological:  Negative for dizziness, tingling, sensory change, weakness and headaches.  Endo/Heme/Allergies: Negative for polydipsia.  Psychiatric/Behavioral: Negative.   All other systems reviewed and are negative.     Physical Exam: BP 130/86   Pulse 90   Temp (!) 97 F (36.1 C)   Wt 233 lb 6.4 oz (105.9 kg)   SpO2 98%   BMI 43.39 kg/m  Wt Readings from Last 3 Encounters:  04/27/18 233 lb 6.4 oz (105.9 kg)  01/20/18 234 lb (106.1 kg)  10/12/17 234 lb 9.6 oz (106.4 kg)   General Appearance: Well nourished, in no apparent distress. Eyes: PERRLA, EOMs, conjunctiva no swelling or erythema Sinuses: No Frontal/maxillary tenderness ENT/Mouth: Ext aud canals clear, TMs without erythema, bulging. No erythema, swelling, or exudate on post pharynx.  Tonsils not swollen or erythematous. Hearing normal.  Neck: Supple, thyroid normal.  Respiratory: Respiratory effort normal, BS equal bilaterally without rales, rhonchi, wheezing or stridor.  Cardio: RRR with no MRGs. Brisk peripheral pulses without edema.  Abdomen: Soft, + BS.  Non tender, no guarding, rebound, hernias, masses. Lymphatics: Non tender without  lymphadenopathy.  Musculoskeletal: Full ROM, 5/5 strength, Normal gait Skin: Warm, dry without rashes, lesions, ecchymosis.  Neuro: Cranial nerves intact. No cerebellar symptoms.  Psych: Awake and oriented X 3, normal affect, Insight and Judgment appropriate.    Izora Ribas, NP 10:29 AM [facility]

## 2018-04-27 ENCOUNTER — Ambulatory Visit: Payer: BC Managed Care – PPO | Admitting: Adult Health

## 2018-04-27 ENCOUNTER — Encounter: Payer: Self-pay | Admitting: Adult Health

## 2018-04-27 VITALS — BP 130/86 | HR 90 | Temp 97.0°F | Wt 233.4 lb

## 2018-04-27 DIAGNOSIS — I10 Essential (primary) hypertension: Secondary | ICD-10-CM

## 2018-04-27 DIAGNOSIS — E782 Mixed hyperlipidemia: Secondary | ICD-10-CM | POA: Diagnosis not present

## 2018-04-27 DIAGNOSIS — R7309 Other abnormal glucose: Secondary | ICD-10-CM | POA: Diagnosis not present

## 2018-04-27 DIAGNOSIS — Z79899 Other long term (current) drug therapy: Secondary | ICD-10-CM

## 2018-04-27 DIAGNOSIS — E559 Vitamin D deficiency, unspecified: Secondary | ICD-10-CM | POA: Diagnosis not present

## 2018-04-27 MED ORDER — HYDROCHLOROTHIAZIDE 12.5 MG PO CAPS: 12.5000 mg | ORAL_CAPSULE | Freq: Every day | ORAL | 1 refills | Status: DC

## 2018-04-27 MED ORDER — BUPROPION HCL ER (XL) 150 MG PO TB24: 150.0000 mg | ORAL_TABLET | Freq: Every day | ORAL | 1 refills | Status: DC

## 2018-04-27 NOTE — Patient Instructions (Addendum)
Goals    . DIET - EAT MORE FRUITS AND VEGETABLES     Aim for 7-10 servings (1/2 cup per serving)     . DIET - INCREASE WATER INTAKE     Aim for 65-80+ fluid ounces daily    . Exercise 150 min/wk Moderate Activity      Set up 3 schedules you can rotate through - 1 higher calorie day with higher carbs, good protein/fat (for cardio days)  1 days with high protein, mod fat, mod carbs (weights day)  1 day with high protein and fat, low carb  When you hit a plateau (typically every 6-12 weeks) and stop seeing progress, plan to change up your routine. Avoid cutting calories too much, but rather aim to continually keep your body guessing      Drink 1/2 your body weight in fluid ounces of water daily; drink a tall glass of water 30 min before meals  Don't eat until you're stuffed- listen to your stomach and eat until you are 80% full   Try eating off of a salad plate; wait 10 min after finishing before going back for seconds  Start by eating the vegetables on your plate; aim for 50% of your meals to be fruits or vegetables  Then eat your protein - lean meats (grass fed if possible), fish, beans, nuts in moderation  Eat your carbs/starch last ONLY if you still are hungry. If you can, stop before finishing it all  Avoid sugar and flour - the closer it looks to it's original form in nature, typically the better it is for you  Splurge in moderation - "assign" days when you get to splurge and have the "bad stuff" - I like to follow a 80% - 20% plan- "good" choices 80 % of the time, "bad" choices in moderation 20% of the time  Simple equation is: Calories out > calories in = weight loss - even if you eat the bad stuff, if you limit portions, you will still lose weight   8 Critical Weight-Loss Tips That Aren't Diet and Exercise  1. STARVE THE DISTRACTIONS  All too often when we eat, we're also multitasking: watching TV, answering emails, scrolling through social media. These habits are  detrimental to having a strong, clear, healthy relationship with food, and they can hinder our ability to make dietary changes.  In order to truly focus on what you're eating, how much you're eating, why you're eating those specific foods and, most importantly, how those foods make you feel, you need to starve the distractions. That means when you eat, just eat. Focus on your food, the process it went through to end up on your plate, where it came from and how it nourishes you. With this technique, you're more likely to finish a meal feeling satiated.  2.  CONSIDER WHAT YOU'RE NOT WILLING TO DO  This might sound counterintuitive, but it can help provide a "why" when motivation is waning. Declare, in writing, what you are unwilling to do, for example "I am unwilling to be the old dad who cannot play sports with my children".  So consider what you're not willing to accept, write it down, and keep it at the ready.  3.  STOP LABELING FOOD "GOOD" AND "BAD"  You've probably heard someone say they ate something "bad." Maybe you've even said it yourself.  The trouble with 'bad' foods isn't that they'll send you to the grave after a bite or two. The trouble comes  when we eat excessive portions of really calorie-dense foods meal after meal, day after day.  Instead of labeling foods as good or bad, think about which foods you can eat a lot of, and which ones you should just eat a little of. Then, plan ways to eat the foods you really like in portions that fit with your overall goals. A good example of this would be having a slice of pizza alongside a club salad with chicken breast, avocado and a bit of dressing. This is vastly different than 3 slices of pizza, 4 breadsticks with cheese sauce and half of a liter of regular soda.  4.  BRUSH YOUR TEETH AFTER YOU EAT  Getting your mindset in order is important, but sometimes small habits can make a big difference. After eating, you still have the taste of food  in their mouth, which often causes people to eat more even if they are full or engage in a nibble or two of dessert.  Brushing your teeth will remove the taste of food from your mouth, and the clean, minty freshness will serve as a cue that mealtime is over.  5.  FOCUS ON CROWDING NOT CUTTING  The most common first step during 'dieting' is to cut. We cut our portion sizes down, we cut out 'bad' foods, we cut out entire food groups. This act of cutting puts [place] and our minds into scarcity mode.  When something is off-limits, even if you're able to avoid it for a while, you could end up bingeing on it later because you've gone so long without it. So, instead of cutting, focus on crowding. If you crowd your plate and fill it up with more foods like veggies and protein, it simply allows less room for the other stuff. In other words, shift your focus away from what you can't eat, and celebrate the foods that will help you reach your goals.  6.  TAKE TRACKING A STEP FURTHER  Track what you eat, when you ate it, how much you ate and how that food made you feel. Being completely honest with yourself and writing down every single thing that passes through your lips will help you start to notice that maybe you actually do snack, possibly take in more sugar than you thought, eat when you're bored rather than just hungry or maybe that you have a habit of snacking before bed while watching TV.  The difference from simply tracking your food intake is you're taking into account how food makes you feel, as well as what you're doing while you're eating. This is about becoming more mindful of what, when and why you eat.  7.  PRIORITIZE GOOD SLEEP  One of the strongest risk factors for being overweight is poor sleep. When you're feeling tired, you're more likely to choose unhealthy comfort foods and to skip your workout. Additionally, sleep deprivation may slow down your metabolism. [employer]! Therefore, sleeping 7-8 hours  per night can help with weight loss without having to change your diet or increase your physical activity. And if you feel you snore and still wake up tired, talk with me about sleep apnea.  8.  SET ASIDE TIME TO DISCONNECT  Just get out there. Disconnect from the electronics and connect to the elements. Not only will this help reduce stress (a major factor in weight gain) by giving your mind a break from the constant stimulation we've all become so accustomed to, but it may also reprogram your brain to  connect with yourself and what you're feeling.    SMART goals Having a solid fitness goal is an amazing way to power you towards success, but not all goals are created equal. While it's great to have an end-game in mind, there are some best practices when it comes to goal setting. Whether you want to lose weight, improve your fitness level, or train for an event, putting the SMART method into action can help you achieve what you set out to do.  SMART stands for specific, measurable, attainable, relevant, and timely-all of which are important in reaching a fitness objective. SMART goals can help keep you on track and remind you of your priorities, so you're able to follow through with every workout or healthy meal you have planned.   Get SMART and put these five elements into action when you're setting your fitness goal.  1. Specific  You need something that's not too arbitrary. For example, a bad goal would be, say, 'get healthy'. A specific goal would be to lose weight. You'll narrow down that goal even further by using the rest of the method, but whether you want to get stronger, faster, or smaller, having a baseline points you in the right direction.  2. Measurable  Here's where you determine exactly how you'll measure your goal. If you're going to follow the bad goal, it would be get really healthy.That's not quantifiable. A measurable goal would be, say, 'lose 10 pounds'. You can quantify  your progress, and you can sort of back into a time frame once you have that. Your goal may be to master a pull-up, run five miles, or go to the gym four days a week-whatever it is, you should have a definite way of knowing when you've reached your goal.  3. Attainable  While it can be helpful to set big-picture goals in the long-term, you need a more achievable goal on the horizon to keep you on track. You want to start small and see early wins, which encourages long-term consistency.  If you set something too lofty right off the bat, it might be discouraging to not make progress as fast as you would like. You should also consider the size of your goal-for example, a goal of losing 30 pounds in one month just isn't going to happen, so you're better off setting smaller goals that are in closer reach.  4. Relevant  This is where things get a little tricky, finding your "why" is easier said than done. Ask yourself, 'is this goal worthwhile, and am I motivated to do it?' Creating a goal with some type of motivation attached to it, like I want to lose 10 pounds in two months to be ready for my wedding, can give a bit of relevancy to your goal. Whether you want to feel confident at a big event or perform better during everyday activities, pinpoint why a goal is important to you.  5. Timely  You want to be strict about a deadline-doing so creates urgency. It's also important not to set your sights too far out. If you give yourself four months to lose 10 pounds, that might be too long because you aren't incentivized to start working at it immediately. Instead, consider setting smaller goals along the way, like "I want to lose three pounds in two weeks." Maybe running a marathon is your long-term goal, but if you've never been a runner, signing up for one that's a month away isn't realistic-instead, set smaller mileage goals  for shorter time periods and work your way up.  You should also be honest with  yourself about what you're able to accomplish in a given time frame. You just need to adjust your expectations so they're in line with your schedule and commitments.  Once you have your goal in place, it's all about the follow-through. Whether you want to lose one pound a week, be able to do five full push-ups in two weeks, or run a 5K in under 30 minutes in four weeks, you can come up with a plan to help get your where you want to go-but it all starts with deciding what you want. Be accountable to yourself, stay consistent, and the results will follow.  So try the exercise at home and at your next visit come in with your SMART goal and we can discuss how together we can achieve this goal!

## 2018-04-28 ENCOUNTER — Encounter: Payer: Self-pay | Admitting: Adult Health

## 2018-04-28 DIAGNOSIS — R7303 Prediabetes: Secondary | ICD-10-CM | POA: Insufficient documentation

## 2018-04-28 LAB — COMPLETE METABOLIC PANEL WITH GFR
AG RATIO: 1.3 (calc) (ref 1.0–2.5)
ALT: 13 U/L (ref 6–29)
AST: 14 U/L (ref 10–30)
Albumin: 3.9 g/dL (ref 3.6–5.1)
Alkaline phosphatase (APISO): 78 U/L (ref 33–115)
BUN: 8 mg/dL (ref 7–25)
CALCIUM: 8.9 mg/dL (ref 8.6–10.2)
CO2: 27 mmol/L (ref 20–32)
CREATININE: 0.7 mg/dL (ref 0.50–1.10)
Chloride: 105 mmol/L (ref 98–110)
GFR, EST AFRICAN AMERICAN: 124 mL/min/{1.73_m2} (ref >=60)
GFR, EST NON AFRICAN AMERICAN: 107 mL/min/{1.73_m2} (ref >=60)
Globulin: 3 g/dL (calc) (ref 1.9–3.7)
Glucose, Bld: 86 mg/dL (ref 65–99)
POTASSIUM: 4 mmol/L (ref 3.5–5.3)
Sodium: 138 mmol/L (ref 135–146)
TOTAL PROTEIN: 6.9 g/dL (ref 6.1–8.1)
Total Bilirubin: 0.4 mg/dL (ref 0.2–1.2)

## 2018-04-28 LAB — CBC WITH DIFFERENTIAL/PLATELET
BASOS PCT: 0.3 %
Basophils Absolute: 27 cells/uL (ref 0–200)
EOS PCT: 0.9 %
Eosinophils Absolute: 80 cells/uL (ref 15–500)
HEMATOCRIT: 37.3 % (ref 35.0–45.0)
Hemoglobin: 12.2 g/dL (ref 11.7–15.5)
Lymphs Abs: 2056 cells/uL (ref 850–3900)
MCH: 26.3 pg — ABNORMAL LOW (ref 27.0–33.0)
MCHC: 32.7 g/dL (ref 32.0–36.0)
MCV: 80.6 fL (ref 80.0–100.0)
MPV: 10.1 fL (ref 7.5–12.5)
Monocytes Relative: 5.9 %
Neutro Abs: 6212 cells/uL (ref 1500–7800)
Neutrophils Relative %: 69.8 %
PLATELETS: 316 10*3/uL (ref 140–400)
RBC: 4.63 10*6/uL (ref 3.80–5.10)
RDW: 13.4 % (ref 11.0–15.0)
TOTAL LYMPHOCYTE: 23.1 %
WBC mixed population: 525 cells/uL (ref 200–950)
WBC: 8.9 10*3/uL (ref 3.8–10.8)

## 2018-04-28 LAB — HEMOGLOBIN A1C
HEMOGLOBIN A1C: 5.8 %{Hb} — AB (ref <5.7)
MEAN PLASMA GLUCOSE: 120 (calc)
eAG (mmol/L): 6.6 (calc)

## 2018-04-28 LAB — LIPID PANEL
CHOL/HDL RATIO: 4.3 (calc) (ref <5.0)
Cholesterol: 194 mg/dL (ref <200)
HDL: 45 mg/dL — AB (ref >50)
LDL CHOLESTEROL (CALC): 122 mg/dL — AB
NON-HDL CHOLESTEROL (CALC): 149 mg/dL — AB (ref <130)
TRIGLYCERIDES: 152 mg/dL — AB (ref <150)

## 2018-04-28 LAB — TSH: TSH: 1.07 m[IU]/L

## 2018-06-28 ENCOUNTER — Other Ambulatory Visit: Payer: Self-pay | Admitting: Adult Health

## 2018-06-28 ENCOUNTER — Encounter: Payer: Self-pay | Admitting: Adult Health

## 2018-06-28 DIAGNOSIS — M25561 Pain in right knee: Principal | ICD-10-CM

## 2018-06-28 DIAGNOSIS — M25562 Pain in left knee: Principal | ICD-10-CM

## 2018-06-28 DIAGNOSIS — G8929 Other chronic pain: Secondary | ICD-10-CM

## 2018-07-11 ENCOUNTER — Encounter (INDEPENDENT_AMBULATORY_CARE_PROVIDER_SITE_OTHER): Payer: Self-pay | Admitting: Family Medicine

## 2018-07-11 ENCOUNTER — Ambulatory Visit (INDEPENDENT_AMBULATORY_CARE_PROVIDER_SITE_OTHER): Payer: BC Managed Care – PPO | Admitting: Family Medicine

## 2018-07-11 DIAGNOSIS — G8929 Other chronic pain: Secondary | ICD-10-CM | POA: Diagnosis not present

## 2018-07-11 DIAGNOSIS — K219 Gastro-esophageal reflux disease without esophagitis: Secondary | ICD-10-CM | POA: Insufficient documentation

## 2018-07-11 DIAGNOSIS — M25561 Pain in right knee: Secondary | ICD-10-CM | POA: Diagnosis not present

## 2018-07-11 DIAGNOSIS — M25562 Pain in left knee: Secondary | ICD-10-CM

## 2018-07-11 DIAGNOSIS — N760 Acute vaginitis: Secondary | ICD-10-CM | POA: Insufficient documentation

## 2018-07-11 DIAGNOSIS — Z6841 Body Mass Index (BMI) 40.0 and over, adult: Secondary | ICD-10-CM

## 2018-07-11 DIAGNOSIS — N920 Excessive and frequent menstruation with regular cycle: Secondary | ICD-10-CM | POA: Insufficient documentation

## 2018-07-11 DIAGNOSIS — N750 Cyst of Bartholin's gland: Secondary | ICD-10-CM | POA: Insufficient documentation

## 2018-07-11 DIAGNOSIS — D259 Leiomyoma of uterus, unspecified: Secondary | ICD-10-CM | POA: Insufficient documentation

## 2018-07-11 DIAGNOSIS — N926 Irregular menstruation, unspecified: Secondary | ICD-10-CM | POA: Insufficient documentation

## 2018-07-11 DIAGNOSIS — R87619 Unspecified abnormal cytological findings in specimens from cervix uteri: Secondary | ICD-10-CM | POA: Insufficient documentation

## 2018-07-11 DIAGNOSIS — R102 Pelvic and perineal pain: Secondary | ICD-10-CM | POA: Insufficient documentation

## 2018-07-11 NOTE — Patient Instructions (Signed)
   Knees:   - Straight leg raises with foot turned outward.  - Avoid squats, lunges, stair climbing.  - Glucosamine sulfate 1,000 mg twice daily.  - Turmeric capsules 500 mg twice daily.   Diet:  - Minimize intake of processed carbs (breads; pastas; cereals; sugars/sweets).  - Low glycemic index fruits ([email or web address]).  - Eggs, ham, etc., for breakfast.  - All the veggies you want.  - Nuts (other than peanuts) can be good for snacks.

## 2018-07-11 NOTE — Progress Notes (Signed)
Office Visit Note   Patient: Kendra Stein           Date of Birth: 01-Nov-1975           MRN: [id number] Visit Date: 07/11/2018 Requested by: Liane Comber, NP [address] [place], [place] [address] PCP: Unk Pinto, MD  Subjective: Chief Complaint  Patient presents with  . Right Knee - Pain    NKI. Pain x over 1 year. Swelling and popping. Pain medial aspect - occasionally radiates up to right groin area.  . Left Knee - Pain    NKI. Pain and swelling x over 1 year.  "Feels like it's swollen all the time." Pain in medial and lateral aspects.    HPI: She is a 42 year old with bilateral knee pain.  Intermittent pain for at least a year in both knees, and much longer than that for the left knee.  Anterior pain on the left, anteromedial on the right.  Occasional popping, though no locking.  The left leg sometimes feels like is going to give way.  They bother her especially at night, aching pain.  Ibuprofen sometimes helps.  She cannot recall a specific injury.  She has gained quite a bit of weight over the years.  She wonders whether that might be contributing.  She has had a very difficult time losing weight.  Current weight is about 220 and height is 5 foot 2.  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               ROS: She has hypertension and migraines.  Recent labs showed prediabetes with elevated triglycerides relative to HDL.  Vitamin D was normal.  Other systems were reviewed and are negative.  Objective: Vital Signs: There were no vitals taken for this visit.  Physical Exam:  Knees: Bilateral wide Q angles.  No effusions.  Occasional patellofemoral click on the right.  No patellofemoral crepitus.  Both knees have some pain with patella compression.  Mild medial joint line tenderness on the left with no palpable click with McMurray's.  Lockman's is solid bilaterally.  Imaging: None today.  X-rays of left knee earlier this year showed no  significant abnormalities.  Assessment & Plan: 1.  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  Symptoms exacerbated by obesity. -VMO strengthening, glucosamine.  We discussed strategies for weight loss. -Physical therapy if symptoms persist.  Could consider MRI scan of the most symptomatic knee if not improving.   Follow-Up Instructions: No follow-ups on file.      Procedures: No procedures performed  No notes on file    PMFS History: Patient Active Problem List   Diagnosis Date Noted  . Abnormal cervical Papanicolaou smear 07/11/2018  . Cyst of Bartholin's gland duct 07/11/2018  . Gastroesophageal reflux disease 07/11/2018  . Increased body mass index 07/11/2018  . Irregular periods 07/11/2018  . Menorrhagia 07/11/2018  . Pain in pelvis 07/11/2018  . Uterine leiomyoma 07/11/2018  . Vaginitis and vulvovaginitis 07/11/2018  . Prediabetes 04/28/2018  . Hyperlipidemia 01/19/2018  . Medication management 12/02/2014  . Knee pain, bilateral 09/09/2014  . Essential hypertension 08/01/2014  . Vitamin D deficiency   . Morbid obesity ([place])   . Other abnormal glucose   . Migraines    Past Medical History:  Diagnosis Date  . Abnormal Pap smear   . Cervical polyp   . Depression   . Headache(784.0)   . Migraines   . Morbid obesity ([place])   .  Prediabetes   . Toxemia in pregnancy   . Toxemia in pregnancy   . Umbilical hernia    SOME ABDOMINAL DISCOMFORT  . UTI (urinary tract infection)    ON MEDICATION AS OF 6/10 FOR UTI - HX OF FREQUENT UTI'S  . Vitamin D deficiency     Family History  Problem Relation Age of Onset  . Hypertension Mother   . Arthritis Mother   . Hypertension Brother   . Asthma Daughter   . Breast cancer Maternal Aunt        around 62  . Anesthesia problems Neg Hx   . Hypotension Neg Hx   . Malignant hyperthermia Neg Hx   . Pseudochol deficiency Neg Hx     Past Surgical History:  Procedure Laterality Date  . CESAREAN  SECTION    . CRYOTHERAPY    . ENDOMETRIAL ABLATION    . TUBAL LIGATION    . UMBILICAL HERNIA REPAIR N/A 12/27/2013   Procedure: HERNIA REPAIR UMBILICAL ADULT;  Surgeon: Harl Bowie, MD;  Location: [facility];  Service: General;  Laterality: N/A;  . WISDOM TOOTH EXTRACTION     Social History   Occupational History  . Not on file  Tobacco Use  . Smoking status: Never Smoker  . Smokeless tobacco: Never Used  Substance and Sexual Activity  . Alcohol use: Yes    Comment: occasionally  . Drug use: No  . Sexual activity: Not on file

## 2018-07-12 DIAGNOSIS — M1990 Unspecified osteoarthritis, unspecified site: Secondary | ICD-10-CM

## 2018-07-12 HISTORY — DX: Unspecified osteoarthritis, unspecified site: M19.90

## 2018-07-14 MED ORDER — ETODOLAC 400 MG PO TABS: 400.0000 mg | ORAL_TABLET | Freq: Two times a day (BID) | ORAL | 3 refills | Status: DC | PRN

## 2018-08-01 ENCOUNTER — Ambulatory Visit: Payer: BC Managed Care – PPO | Admitting: Adult Health

## 2018-08-01 ENCOUNTER — Encounter: Payer: Self-pay | Admitting: Adult Health

## 2018-08-01 VITALS — BP 106/76 | HR 96 | Temp 97.7°F | Ht 61.5 in | Wt 234.0 lb

## 2018-08-01 DIAGNOSIS — Z79899 Other long term (current) drug therapy: Secondary | ICD-10-CM

## 2018-08-01 DIAGNOSIS — E782 Mixed hyperlipidemia: Secondary | ICD-10-CM

## 2018-08-01 DIAGNOSIS — E559 Vitamin D deficiency, unspecified: Secondary | ICD-10-CM

## 2018-08-01 DIAGNOSIS — S70362A Insect bite (nonvenomous), left thigh, initial encounter: Secondary | ICD-10-CM

## 2018-08-01 DIAGNOSIS — R7303 Prediabetes: Secondary | ICD-10-CM

## 2018-08-01 DIAGNOSIS — I10 Essential (primary) hypertension: Secondary | ICD-10-CM

## 2018-08-01 DIAGNOSIS — W57XXXA Bitten or stung by nonvenomous insect and other nonvenomous arthropods, initial encounter: Secondary | ICD-10-CM

## 2018-08-01 MED ORDER — PREDNISONE 20 MG PO TABS: ORAL_TABLET | ORAL | 0 refills | Status: DC

## 2018-08-01 MED ORDER — PHENTERMINE HCL 37.5 MG PO TABS: ORAL_TABLET | ORAL | 2 refills | Status: DC

## 2018-08-01 NOTE — Patient Instructions (Signed)
Goals    . DIET - EAT MORE FRUITS AND VEGETABLES     Aim for 7-10 servings (1/2 cup per serving)     . DIET - INCREASE WATER INTAKE     Aim for 65-80+ fluid ounces daily    . Exercise 150 min/wk Moderate Activity       Weight goal: get down to size 12 (patient set goal)  Go ahead and talk with your accountability partner this week and set goal to sign up for weight watchers by this weekend   Kendra Stein's general weight loss tips:   Drink 1/2 your body weight in fluid ounces of water daily; drink a tall glass of water 30 min before meals  Don't eat until you're stuffed- listen to your stomach and eat until you are 80% full   Try eating off of a salad plate; wait 10 min after finishing before going back for seconds  Start by eating the vegetables on your plate; aim for 50% of your meals to be fruits or vegetables  Then eat your protein - lean meats (grass fed if possible), fish, beans, nuts in moderation  Eat your carbs/starch last ONLY if you still are hungry. If you can, stop before finishing it all  Avoid sugar and flour - the closer it looks to it's original form in nature, typically the better it is for you  Splurge in moderation - "assign" days when you get to splurge and have the "bad stuff" - I like to follow a 80% - 20% plan- "good" choices 80 % of the time, "bad" choices in moderation 20% of the time  Simple equation is: Calories out > calories in = weight loss - even if you eat the bad stuff, if you limit portions, you will still lose weight  Keep it easy for yourself - don't keep "bad" foods in the house, shop when you're not hungry and get things that you actually enjoy  Think in rough terms - "high fiber" "good protein source," "healthy fat" etc - best combo for satisfying snack or meal is to start with fluid, then get fiber+protein+healthy fat      SMART goals Having a solid fitness goal is an amazing way to power you towards success, but not all goals are created  equal. While it's great to have an end-game in mind, there are some best practices when it comes to goal setting. Whether you want to lose weight, improve your fitness level, or train for an event, putting the SMART method into action can help you achieve what you set out to do.  SMART stands for specific, measurable, attainable, relevant, and timely-all of which are important in reaching a fitness objective. SMART goals can help keep you on track and remind you of your priorities, so you're able to follow through with every workout or healthy meal you have planned.   Get SMART and put these five elements into action when you're setting your fitness goal.  1. Specific  You need something that's not too arbitrary. For example, a bad goal would be, say, 'get healthy'. A specific goal would be to lose weight. You'll narrow down that goal even further by using the rest of the method, but whether you want to get stronger, faster, or smaller, having a baseline points you in the right direction.  2. Measurable  Here's where you determine exactly how you'll measure your goal. If you're going to follow the bad goal, it would be get really healthy.That's  not quantifiable. A measurable goal would be, say, 'lose 10 pounds'. You can quantify your progress, and you can sort of back into a time frame once you have that. Your goal may be to master a pull-up, run five miles, or go to the gym four days a week-whatever it is, you should have a definite way of knowing when you've reached your goal.  3. Attainable  While it can be helpful to set big-picture goals in the long-term, you need a more achievable goal on the horizon to keep you on track. You want to start small and see early wins, which encourages long-term consistency.  If you set something too lofty right off the bat, it might be discouraging to not make progress as fast as you would like. You should also consider the size of your goal-for example, a goal of  losing 30 pounds in one month just isn't going to happen, so you're better off setting smaller goals that are in closer reach.  4. Relevant  This is where things get a little tricky, finding your "why" is easier said than done. Ask yourself, 'is this goal worthwhile, and am I motivated to do it?' Creating a goal with some type of motivation attached to it, like I want to lose 10 pounds in two months to be ready for my wedding, can give a bit of relevancy to your goal. Whether you want to feel confident at a big event or perform better during everyday activities, pinpoint why a goal is important to you.  5. Timely  You want to be strict about a deadline-doing so creates urgency. It's also important not to set your sights too far out. If you give yourself four months to lose 10 pounds, that might be too long because you aren't incentivized to start working at it immediately. Instead, consider setting smaller goals along the way, like "I want to lose three pounds in two weeks." Maybe running a marathon is your long-term goal, but if you've never been a runner, signing up for one that's a month away isn't realistic-instead, set smaller mileage goals for shorter time periods and work your way up.  You should also be honest with yourself about what you're able to accomplish in a given time frame. You just need to adjust your expectations so they're in line with your schedule and commitments.  Once you have your goal in place, it's all about the follow-through. Whether you want to lose one pound a week, be able to do five full push-ups in two weeks, or run a 5K in under 30 minutes in four weeks, you can come up with a plan to help get your where you want to go-but it all starts with deciding what you want. Be accountable to yourself, stay consistent, and the results will follow.  So try the exercise at home and at your next visit come in with your SMART goal and we can discuss how together we can achieve this  goal!

## 2018-08-01 NOTE — Progress Notes (Signed)
FOLLOW UP  Assessment and Plan:   Hypertension Well controlled with current medications  Monitor blood pressure at home; patient to call if consistently greater than 130/80 Continue DASH diet.   Reminder to go to the ER if any CP, SOB, nausea, dizziness, severe HA, changes vision/speech, left arm numbness and tingling and jaw pain.  Cholesterol Currently above goal, treated by lifestyle only Continue low cholesterol diet and exercise.  Check lipid panel  Obesity with co morbidities Long discussion about weight loss, diet, and exercise Recommended diet heavy in fruits and veggies and low in animal meats, cheeses, and dairy products, appropriate calorie intake Discussed ideal weight for height and set goal - she prefers to work on clothing sizes, goal to get down to pant size 12 by next visit Patient will work on getting accountability partner, signing up for weight watchers to help with meal choices and accountability  Patient on phentermine/wellbutrin 150 mg with benefit and no SE, taking drug breaks; continue close follow up.  Will follow up in 3 months  Vitamin D Def Below goal at last visit; she has initiated supplement , taking 5000-10000 IU daily Discussed goal of 70-100 Defer Vit D level  Continue diet and meds as discussed. Further disposition pending results of labs. Discussed med's effects and SE's.   Over 30 minutes of exam, counseling, chart review, and critical decision making was performed.   Future Appointments  Date Time Provider [place]  10/16/2018 10:00 AM Vicie Mutters, PA-C [id number] None   ----------------------------------------------------------------------------------------------------------------------  HPI 43 y.o. female  presents for 3 month follow up on hypertension, cholesterol, glucose management, morbid obesity and vitamin D deficiency.   Presents today with possible spider bites that she woke up with to L thigh; itchy, inflamed,  tender. She tried topical hydrocortisone this AM prior to work, unsure if this helped. Has Tdap 08/02/2016  she is prescribed phentermine (taking occasionally only) and wellbutrin for weight loss.  While on the medication they have lost 0 lbs since last visit, but reports she is losing inches, is down 1 pant size and now wearing a size 14. They deny palpitations, anxiety, trouble sleeping, elevated BP.   BMI is Body mass index is 43.5 kg/m., she is working on diet and exercise, has been walking with her mom, hasn't joined the gym. She is pushing water intake, has timer set to drink every 2 hours, has cut out soda. She has cut down on sweets, eating fruits instead. Has 2 eggs or oatmeal and pieces of fruit, has celery/carrots and grapes for snacks, if she eats out she will get a side salad with protein. She is thinking about joining weight watchers for [employer] Readings from Last 3 Encounters:  08/01/18 234 lb (106.1 kg)  04/27/18 233 lb 6.4 oz (105.9 kg)  01/20/18 234 lb (106.1 kg)   She reports she has been on contrave (+ lose it! Tracking app) previously and was able to lose 15 lb on this.   Her blood pressure has been controlled at home, today their BP is BP: 106/76  She does workout. She denies chest pain, shortness of breath, dizziness.   She is not on cholesterol medication and denies myalgias. Her cholesterol is not at goal. The cholesterol last visit was:   Lab Results  Component Value Date   CHOL 194 04/27/2018   HDL 45 (L) 04/27/2018   LDLCALC 122 (H) 04/27/2018   TRIG 152 (H) 04/27/2018   CHOLHDL 4.3 04/27/2018    She  has been working on diet and exercise for glucose management, and denies foot ulcerations, increased appetite, nausea, paresthesia of the feet, polydipsia, polyuria, visual disturbances, vomiting and weight loss. Last A1C in the office was:  Lab Results  Component Value Date   HGBA1C 5.8 (H) 04/27/2018   Patient is on Vitamin D supplement, taking 5000 IU  daily  Lab Results  Component Value Date   VD25OH 36 10/12/2017       Current Medications:  Current Outpatient Medications on File Prior to Visit  Medication Sig  . buPROPion (WELLBUTRIN XL) 150 MG 24 hr tablet Take 1 tablet (150 mg total) by mouth daily.  . Cholecalciferol 5000 units TABS Take 5,000 Units by mouth daily.  Marland Kitchen etodolac (LODINE) 400 MG tablet Take 1 tablet (400 mg total) by mouth 2 (two) times daily as needed.  . hydrochlorothiazide (MICROZIDE) 12.5 MG capsule Take 1 capsule (12.5 mg total) by mouth daily.  Marland Kitchen ibuprofen (ADVIL,MOTRIN) 200 MG tablet Take 600 mg by mouth every 6 (six) hours as needed for mild pain.  . methocarbamol (ROBAXIN) 500 MG tablet Take 1 tablet (500 mg total) by mouth every 8 (eight) hours as needed for muscle spasms.  . Multiple Vitamins-Minerals (MULTIVITAMIN GUMMIES ADULT PO) Take by mouth. DAILY  . naproxen (NAPROSYN) 500 MG tablet Take 1 tablet (500 mg total) by mouth 2 (two) times daily. (Patient taking differently: Take 500 mg by mouth as needed. )  . SUMAtriptan (IMITREX) 100 MG tablet Take 1 tablet (100 mg total) by mouth once as needed for migraine. May repeat in 2 hours if headache persists or recurs.  . phentermine (ADIPEX-P) 37.5 MG tablet TAKE ONE TABLET BY MOUTH DAILY BEFORE BREAKFAST (Patient not taking: Reported on 08/01/2018)   No current facility-administered medications on file prior to visit.      Allergies: No Known Allergies   Medical History:  Past Medical History:  Diagnosis Date  . Abnormal Pap smear   . Cervical polyp   . Depression   . Headache(784.0)   . Migraines   . Morbid obesity ([place])   . Prediabetes   . Toxemia in pregnancy   . Toxemia in pregnancy   . Umbilical hernia    SOME ABDOMINAL DISCOMFORT  . UTI (urinary tract infection)    ON MEDICATION AS OF 6/10 FOR UTI - HX OF FREQUENT UTI'S  . Vitamin D deficiency    Family history- Reviewed and unchanged Social history- Reviewed and unchanged   Review  of Systems:  Review of Systems  Constitutional: Negative for malaise/fatigue and weight loss.  HENT: Negative for hearing loss and tinnitus.   Eyes: Negative for blurred vision and double vision.  Respiratory: Negative for cough, shortness of breath and wheezing.   Cardiovascular: Negative for chest pain, palpitations, orthopnea, claudication and leg swelling.  Gastrointestinal: Negative for abdominal pain, blood in stool, constipation, diarrhea, heartburn, melena, nausea and vomiting.  Genitourinary: Negative.   Musculoskeletal: Negative for joint pain and myalgias.  Skin: Negative for rash.  Neurological: Negative for dizziness, tingling, sensory change, weakness and headaches.  Endo/Heme/Allergies: Negative for polydipsia.  Psychiatric/Behavioral: Negative.   All other systems reviewed and are negative.   Physical Exam: BP 106/76   Pulse 96   Temp 97.7 F (36.5 C)   Ht 5' 1.5" (1.562 m)   Wt 234 lb (106.1 kg)   SpO2 99%   BMI 43.50 kg/m  Wt Readings from Last 3 Encounters:  08/01/18 234 lb (106.1 kg)  04/27/18 233  lb 6.4 oz (105.9 kg)  01/20/18 234 lb (106.1 kg)   General Appearance: Well nourished, in no apparent distress. Eyes: PERRLA, EOMs, conjunctiva no swelling or erythema Sinuses: No Frontal/maxillary tenderness ENT/Mouth: Ext aud canals clear, TMs without erythema, bulging. No erythema, swelling, or exudate on post pharynx.  Tonsils not swollen or erythematous. Hearing normal.  Neck: Supple, thyroid normal.  Respiratory: Respiratory effort normal, BS equal bilaterally without rales, rhonchi, wheezing or stridor.  Cardio: RRR with no MRGs. Brisk peripheral pulses without edema.  Abdomen: Soft, + BS.  Non tender, no guarding, rebound, hernias, masses. Lymphatics: Non tender without lymphadenopathy.  Musculoskeletal: Full ROM, 5/5 strength, Normal gait Skin: Warm, dry without rashes, lesions, ecchymosis.  Neuro: Cranial nerves intact. No cerebellar symptoms.   Psych: Awake and oriented X 3, normal affect, Insight and Judgment appropriate.    Izora Ribas, NP 2:54 PM [facility]

## 2018-08-02 LAB — COMPLETE METABOLIC PANEL WITH GFR
AG Ratio: 1.4 (calc) (ref 1.0–2.5)
ALBUMIN MSPROF: 4.1 g/dL (ref 3.6–5.1)
ALT: 14 U/L (ref 6–29)
AST: 15 U/L (ref 10–30)
Alkaline phosphatase (APISO): 75 U/L (ref 33–115)
BUN: 11 mg/dL (ref 7–25)
CO2: 27 mmol/L (ref 20–32)
Calcium: 9.1 mg/dL (ref 8.6–10.2)
Chloride: 104 mmol/L (ref 98–110)
Creat: 0.83 mg/dL (ref 0.50–1.10)
GFR, Est African American: 101 mL/min/{1.73_m2} (ref >=60)
GFR, Est Non African American: 87 mL/min/{1.73_m2} (ref >=60)
Globulin: 2.9 g/dL (calc) (ref 1.9–3.7)
Glucose, Bld: 78 mg/dL (ref 65–99)
Potassium: 3.7 mmol/L (ref 3.5–5.3)
Sodium: 139 mmol/L (ref 135–146)
Total Bilirubin: 0.3 mg/dL (ref 0.2–1.2)
Total Protein: 7 g/dL (ref 6.1–8.1)

## 2018-08-02 LAB — CBC WITH DIFFERENTIAL/PLATELET
ABSOLUTE MONOCYTES: 775 {cells}/uL (ref 200–950)
Basophils Absolute: 38 cells/uL (ref 0–200)
Basophils Relative: 0.3 %
Eosinophils Absolute: 63 cells/uL (ref 15–500)
Eosinophils Relative: 0.5 %
HEMATOCRIT: 36.8 % (ref 35.0–45.0)
Hemoglobin: 12.5 g/dL (ref 11.7–15.5)
LYMPHS ABS: 2225 {cells}/uL (ref 850–3900)
MCH: 26.9 pg — ABNORMAL LOW (ref 27.0–33.0)
MCHC: 34 g/dL (ref 32.0–36.0)
MCV: 79.3 fL — AB (ref 80.0–100.0)
MPV: 9.7 fL (ref 7.5–12.5)
Monocytes Relative: 6.2 %
Neutro Abs: 9400 cells/uL — ABNORMAL HIGH (ref 1500–7800)
Neutrophils Relative %: 75.2 %
Platelets: 308 10*3/uL (ref 140–400)
RBC: 4.64 10*6/uL (ref 3.80–5.10)
RDW: 13.3 % (ref 11.0–15.0)
Total Lymphocyte: 17.8 %
WBC: 12.5 10*3/uL — ABNORMAL HIGH (ref 3.8–10.8)

## 2018-08-02 LAB — LIPID PANEL
Cholesterol: 196 mg/dL (ref <200)
HDL: 44 mg/dL — ABNORMAL LOW (ref >50)
LDL Cholesterol (Calc): 122 mg/dL (calc) — ABNORMAL HIGH
Non-HDL Cholesterol (Calc): 152 mg/dL (calc) — ABNORMAL HIGH (ref <130)
Total CHOL/HDL Ratio: 4.5 (calc) (ref <5.0)
Triglycerides: 179 mg/dL — ABNORMAL HIGH (ref <150)

## 2018-08-02 LAB — HEMOGLOBIN A1C
Hgb A1c MFr Bld: 5.8 % of total Hgb — ABNORMAL HIGH (ref <5.7)
Mean Plasma Glucose: 120 (calc)
eAG (mmol/L): 6.6 (calc)

## 2018-08-02 LAB — TSH: TSH: 0.78 mIU/L

## 2018-08-03 ENCOUNTER — Ambulatory Visit: Payer: Self-pay | Admitting: Adult Health

## 2018-09-13 ENCOUNTER — Other Ambulatory Visit: Payer: Self-pay | Admitting: Internal Medicine

## 2018-09-13 DIAGNOSIS — Z1231 Encounter for screening mammogram for malignant neoplasm of breast: Secondary | ICD-10-CM

## 2018-10-13 DIAGNOSIS — R7309 Other abnormal glucose: Secondary | ICD-10-CM | POA: Insufficient documentation

## 2018-10-13 NOTE — Progress Notes (Signed)
Complete Physical  Assessment and Plan: Encounter for general adult medical examination with abnormal findings 1 year Get MGM, delayed due to COVID  Essential hypertension - continue medications, DASH diet, exercise and monitor at home. Call if greater than 130/80.  -     CBC with Differential/Platelet -     COMPLETE METABOLIC PANEL WITH GFR -     TSH -     Urinalysis, Routine w reflex microscopic -     Microalbumin / creatinine urine ratio -     EKG 12-Lead  Vitamin D deficiency -     VITAMIN D 25 Hydroxy (Vit-D Deficiency, Fractures)  Abnormal glucose -     Hemoglobin A1c Discussed disease progression and risks Discussed diet/exercise, weight management and risk modification  Morbid obesity ([place]) -     EKG 12-Lead - follow up 6 months for progress monitoring - increase veggies, decrease carbs - long discussion about weight loss, diet, and exercis  Medication management -     Magnesium  Morbid obesity with BMI of 40.0-44.9, adult (HCC) -     phentermine (ADIPEX-P) 37.5 MG tablet; TAKE ONE TABLET BY MOUTH DAILY BEFORE BREAKFAST - follow up 6 months for progress monitoring - increase veggies, decrease carbs - long discussion about weight loss, diet, and exercise - try 7 min J&J workout app  Mixed hyperlipidemia -     Lipid panel check lipids decrease fatty foods increase activity.   Abnormal cervical Papanicolaou smear, unspecified abnormal pap finding UTD  Gastroesophageal reflux disease, esophagitis presence not specified Continue PPI/H2 blocker, diet discussed  Migraine without status migrainosus, not intractable, unspecified migraine type Controlled  Screening, anemia, deficiency, iron -     Iron,Total/Total Iron Binding Cap -     Vitamin B12 -     Ferritin      Discussed med's effects and SE's. Screening labs and tests as requested with regular follow-up as recommended. Over 40 minutes of exam, counseling, chart review, and complex, high level  critical decision making was performed this visit.   HPI  43 y.o. female  presents for a complete physical.  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  She does workout. She denies chest pain, shortness of breath, dizziness.  BMI is Body mass index is 44.43 kg/m., she is working on diet and exercise. Wt Readings from Last 3 Encounters:  10/16/18 239 lb (108.4 kg)  08/01/18 234 lb (106.1 kg)  04/27/18 233 lb 6.4 oz (105.9 kg)    She is not on cholesterol medication and denies myalgias. Her cholesterol is at goal. The cholesterol last visit was:   Lab Results  Component Value Date   CHOL 196 08/01/2018   HDL 44 (L) 08/01/2018   LDLCALC 122 (H) 08/01/2018   TRIG 179 (H) 08/01/2018   CHOLHDL 4.5 08/01/2018   She has been working on diet and exercise for prediabetes,  and denies paresthesia of the feet, polydipsia, polyuria and visual disturbances. Last A1C in the office was:  Lab Results  Component Value Date   HGBA1C 5.8 (H) 08/01/2018   Patient is on Vitamin D supplement.   Lab Results  Component Value Date   VD25OH 36 10/12/2017     Has not been having as many headaches with eating better and more consistently.   Current Medications:  Current Outpatient Medications on File Prior  to Visit  Medication Sig Dispense Refill  . buPROPion (WELLBUTRIN XL) 150 MG 24 hr tablet Take 1 tablet (150 mg total) by mouth daily. 90 tablet 1  . Cholecalciferol 5000 units TABS Take 5,000 Units by mouth daily.    Marland Kitchen etodolac (LODINE) 400 MG tablet Take 1 tablet (400 mg total) by mouth 2 (two) times daily as needed. 60 tablet 3  . hydrochlorothiazide (MICROZIDE) 12.5 MG capsule Take 1 capsule (12.5 mg total) by mouth daily. 90 capsule 1  . ibuprofen (ADVIL,MOTRIN) 200 MG tablet Take 600 mg by mouth every 6 (six) hours as needed for mild pain.    . methocarbamol (ROBAXIN) 500  MG tablet Take 1 tablet (500 mg total) by mouth every 8 (eight) hours as needed for muscle spasms. 60 tablet 0  . Multiple Vitamins-Minerals (MULTIVITAMIN GUMMIES ADULT PO) Take by mouth. DAILY    . naproxen (NAPROSYN) 500 MG tablet Take 1 tablet (500 mg total) by mouth 2 (two) times daily. (Patient taking differently: Take 500 mg by mouth as needed. ) 30 tablet 0  . SUMAtriptan (IMITREX) 100 MG tablet Take 1 tablet (100 mg total) by mouth once as needed for migraine. May repeat in 2 hours if headache persists or recurs. 6 tablet 2   No current facility-administered medications on file prior to visit.    Health Maintenance:   Immunization History  Administered Date(s) Administered  . Influenza, Seasonal, Injecte, Preservative Fre 07/26/2016  . Influenza-Unspecified 04/28/2018  . Tdap 01/10/2011, 08/07/2016   Tetanus: 2018 Pneumovax: N/A Prevnar 13: N/A Flu vaccine: 2018 Zostavax:N/A  LMP: s/p ablation Pap: 2019 MGM: 07/2017 DUe will have to get after COVID DEXA: N/A Colonoscopy: N/A EGD: N/A MRI brain 2006 [place] AB 2015 CXR 2017  Patient Care Team: Unk Pinto, MD as PCP - General (Internal Medicine) Everett Graff, MD as Consulting Physician (Obstetrics and Gynecology)  Medical History:  Past Medical History:  Diagnosis Date  . Abnormal Pap smear   . Cervical polyp   . Depression   . Headache(784.0)   . Migraines   . Morbid obesity ([place])   . Prediabetes   . Toxemia in pregnancy   . Toxemia in pregnancy   . Umbilical hernia    SOME ABDOMINAL DISCOMFORT  . UTI (urinary tract infection)    ON MEDICATION AS OF 6/10 FOR UTI - HX OF FREQUENT UTI'S  . Vitamin D deficiency    Allergies No Known Allergies  SURGICAL HISTORY She  has a past surgical history that includes Cryotherapy; Endometrial ablation; Tubal ligation; Wisdom tooth extraction; Cesarean section; and Umbilical hernia repair (N/A, 12/27/2013). FAMILY HISTORY Her family history includes Arthritis in  her mother; Asthma in her daughter; Breast cancer in her maternal aunt; Hypertension in her brother and mother. SOCIAL HISTORY She  reports that she has never smoked. She has never used smokeless tobacco. She reports current alcohol use. She reports that she does not use drugs.   Review of Systems: Review of Systems  Constitutional: Negative.   HENT: Negative.   Eyes: Negative.   Respiratory: Negative.   Cardiovascular: Negative.   Gastrointestinal: Negative.   Genitourinary: Negative.   Musculoskeletal: Negative.   Skin: Negative.   Neurological: Negative.   Endo/Heme/Allergies: Negative.   Psychiatric/Behavioral: Negative.     Physical Exam: Estimated body mass index is 44.43 kg/m as calculated from the following:   Height as of this encounter: 5' 1.5" (1.562 m).   Weight as of this encounter: 239 lb (108.4 kg). BP  118/76   Pulse 96   Temp (!) 97.2 F (36.2 C)   Ht 5' 1.5" (1.562 m)   Wt 239 lb (108.4 kg)   SpO2 99%   BMI 44.43 kg/m    General Appearance: Well nourished, in no apparent distress.  Eyes: PERRLA, EOMs, conjunctiva no swelling or erythema, normal fundi and vessels.  Sinuses: No Frontal/maxillary tenderness  ENT/Mouth: Ext aud canals clear, normal light reflex with TMs without erythema, bulging. Good dentition. No erythema, swelling, or exudate on post pharynx. Tonsils not swollen or erythematous. Hearing normal.  Neck: Supple, thyroid normal. No bruits  Respiratory: Respiratory effort normal, BS equal bilaterally without rales, rhonchi, wheezing or stridor.  Cardio: RRR without murmurs, rubs or gallops. Brisk peripheral pulses without edema.  Chest: symmetric, with normal excursions and percussion.  Breasts: defer OB/GYN Abdomen: Soft, nontender, no guarding, rebound, hernias, masses, or organomegaly.  Lymphatics: Non tender without lymphadenopathy.  Genitourinary: defer OB/GYN Musculoskeletal: Full ROM all peripheral extremities,5/5 strength, and normal  gait.  Skin: Warm, dry without rashes, lesions, ecchymosis. Neuro: Cranial nerves intact, reflexes equal bilaterally. Normal muscle tone, no cerebellar symptoms. Sensation intact.  Psych: Awake and oriented X 3, normal affect, Insight and Judgment appropriate.   EKG: WNL, no ST changes AORTA SCAN: defer  Vicie Mutters 11:13 AM [facility]

## 2018-10-16 ENCOUNTER — Other Ambulatory Visit: Payer: Self-pay

## 2018-10-16 ENCOUNTER — Ambulatory Visit: Payer: BC Managed Care – PPO

## 2018-10-16 ENCOUNTER — Ambulatory Visit: Payer: BC Managed Care – PPO | Admitting: Physician Assistant

## 2018-10-16 ENCOUNTER — Encounter: Payer: Self-pay | Admitting: Physician Assistant

## 2018-10-16 VITALS — BP 118/76 | HR 96 | Temp 97.2°F | Ht 61.5 in | Wt 239.0 lb

## 2018-10-16 DIAGNOSIS — E782 Mixed hyperlipidemia: Secondary | ICD-10-CM

## 2018-10-16 DIAGNOSIS — Z136 Encounter for screening for cardiovascular disorders: Secondary | ICD-10-CM

## 2018-10-16 DIAGNOSIS — Z130 Encounter for screening for diseases of the blood and blood-forming organs and certain disorders involving the immune mechanism: Secondary | ICD-10-CM

## 2018-10-16 DIAGNOSIS — Z6841 Body Mass Index (BMI) 40.0 and over, adult: Secondary | ICD-10-CM

## 2018-10-16 DIAGNOSIS — E559 Vitamin D deficiency, unspecified: Secondary | ICD-10-CM

## 2018-10-16 DIAGNOSIS — Z8639 Personal history of other endocrine, nutritional and metabolic disease: Secondary | ICD-10-CM

## 2018-10-16 DIAGNOSIS — R87619 Unspecified abnormal cytological findings in specimens from cervix uteri: Secondary | ICD-10-CM

## 2018-10-16 DIAGNOSIS — I10 Essential (primary) hypertension: Secondary | ICD-10-CM

## 2018-10-16 DIAGNOSIS — R7303 Prediabetes: Secondary | ICD-10-CM

## 2018-10-16 DIAGNOSIS — Z0001 Encounter for general adult medical examination with abnormal findings: Secondary | ICD-10-CM

## 2018-10-16 DIAGNOSIS — Z1329 Encounter for screening for other suspected endocrine disorder: Secondary | ICD-10-CM

## 2018-10-16 DIAGNOSIS — Z1389 Encounter for screening for other disorder: Secondary | ICD-10-CM

## 2018-10-16 DIAGNOSIS — Z0000 Encounter for general adult medical examination without abnormal findings: Secondary | ICD-10-CM

## 2018-10-16 DIAGNOSIS — G43909 Migraine, unspecified, not intractable, without status migrainosus: Secondary | ICD-10-CM

## 2018-10-16 DIAGNOSIS — K219 Gastro-esophageal reflux disease without esophagitis: Secondary | ICD-10-CM

## 2018-10-16 DIAGNOSIS — Z13220 Encounter for screening for lipoid disorders: Secondary | ICD-10-CM

## 2018-10-16 DIAGNOSIS — R7309 Other abnormal glucose: Secondary | ICD-10-CM

## 2018-10-16 DIAGNOSIS — Z79899 Other long term (current) drug therapy: Secondary | ICD-10-CM

## 2018-10-16 MED ORDER — PHENTERMINE HCL 37.5 MG PO TABS: ORAL_TABLET | ORAL | 2 refills | Status: DC

## 2018-10-16 NOTE — Patient Instructions (Signed)
Drink 1/2 your body weight in fluid ounces of water daily; drink a tall glass of water 30 min before meals  Don't eat until you're stuffed- listen to your stomach and eat until you are 80% full   Try eating off of a salad plate; wait 10 min after finishing before going back for seconds  Start by eating the vegetables on your plate; aim for 50% of your meals to be fruits or vegetables  Then eat your protein - lean meats (grass fed if possible), fish, beans, nuts in moderation  Eat your carbs/starch last ONLY if you still are hungry. If you can, stop before finishing it all  Avoid sugar and flour - the closer it looks to it's original form in nature, typically the better it is for you  Splurge in moderation - "assign" days when you get to splurge and have the "bad stuff" - I like to follow a 80% - 20% plan- "good" choices 80 % of the time, "bad" choices in moderation 20% of the time  Simple equation is: Calories out > calories in = weight loss - even if you eat the bad stuff, if you limit portions, you will still lose weight   Check out 7 min app FROM Wynetta Emery and Wynetta Emery- should have J&J with it  Use of Cloth Face Coverings to Help Slow the Spread of COVID-19  You can check out [email or web address].[email or web address]  How to Wear Cloth Face Coverings Cloth face coverings should- -fit snugly but comfortably against the side of the face -be secured with ties or ear loops -include multiple layers of fabric -allow for breathing without restriction -be able to be laundered and machine dried without damage or  change to shape  CDC on Homemade Cloth Face Coverings CDC recommends wearing cloth face coverings in public settings where other social distancing measures are difficult to maintain (e.g., grocery stores and pharmacies), especially in areas of significant community-based transmission.   CDC also advises the use of simple cloth  face coverings to slow the spread of the virus and help people who may have the virus and do not know it from transmitting it to others.  Cloth face coverings fashioned from household items or made at home from common materials at low cost can be used as an additional, [employer] fast streamer.  Cloth face coverings should not be placed on young children under age 1, anyone who has trouble breathing, or is unconscious, incapacitated or otherwise unable to remove the cloth face covering without assistance. The cloth face coverings recommended are not surgical masks or N-95 respirators.  Those are critical supplies that must continue to be reserved for healthcare workers and other medical first responders, as recommended by current CDC guidance.          Should cloth face coverings be washed or otherwise  cleaned regularly? How regularly? Yes. They should be routinely washed depending on the frequency  of use.  How does one safely sterilize/clean a cloth face covering? A washing machine should suffice in properly washing a cloth face covering.  How does one safely remove a used cloth face covering? Individuals should be careful not to touch their eyes, nose, and mouth  when removing their cloth face covering and wash hands immediately  after removing.    3. Run a 6-inch length of 1/8-inch wide elastic through the wider hem on each side of the cloth face covering. These will be the ear loops. Use a large needle or a  bobby pin to thread it through. Tie the ends tight.  Don't have elastic? Use hair ties or elastic head bands. If you only have string, you can make the ties longer and tie the cloth face covering behind your head.  4. Gently pull on the elastic so that the knots are tucked inside the hem.  Gather the sides of the cloth face covering on the elastic and adjust so the cloth face covering fits your face. Then securely stitch the elastic in place to keep it from slipping.    Quick  Cut T-shirt Cloth Face Covering (no sew method) Materials ; T-shirt ; Scissors Tutorial 1. Cut 7-8 inches of T shirt 2.Cut out tie strings about 6 inches 3. tie strings around the over top of head   Bandana Cloth Face Covering (no sew method)1. -Bandana (or square cotton cloth approximately 20"x20")  -Rubber bands (or hair ties) - Coffee filter -Scissors (if you are cutting your own cloth)  -cut coffee filter  -Place rubber bands or hair ties about 6 inches apart.  -Fold side to the middle and tuck.  -Fold filter in center of folded bandana.   Materials  ;    ;

## 2018-10-17 LAB — CBC WITH DIFFERENTIAL/PLATELET
Absolute Monocytes: 554 cells/uL (ref 200–950)
Basophils Absolute: 42 cells/uL (ref 0–200)
Basophils Relative: 0.5 %
Eosinophils Absolute: 84 cells/uL (ref 15–500)
Eosinophils Relative: 1 %
HCT: 38.2 % (ref 35.0–45.0)
Hemoglobin: 12.6 g/dL (ref 11.7–15.5)
Lymphs Abs: 1722 cells/uL (ref 850–3900)
MCH: 26.7 pg — ABNORMAL LOW (ref 27.0–33.0)
MCHC: 33 g/dL (ref 32.0–36.0)
MCV: 80.9 fL (ref 80.0–100.0)
MPV: 9.4 fL (ref 7.5–12.5)
Monocytes Relative: 6.6 %
Neutro Abs: 5998 cells/uL (ref 1500–7800)
Neutrophils Relative %: 71.4 %
Platelets: 321 10*3/uL (ref 140–400)
RBC: 4.72 10*6/uL (ref 3.80–5.10)
RDW: 13.5 % (ref 11.0–15.0)
Total Lymphocyte: 20.5 %
WBC: 8.4 10*3/uL (ref 3.8–10.8)

## 2018-10-17 LAB — LIPID PANEL
Cholesterol: 220 mg/dL — ABNORMAL HIGH (ref <200)
HDL: 47 mg/dL — ABNORMAL LOW (ref >=50)
LDL Cholesterol (Calc): 144 mg/dL (calc) — ABNORMAL HIGH
Non-HDL Cholesterol (Calc): 173 mg/dL (calc) — ABNORMAL HIGH (ref <130)
Total CHOL/HDL Ratio: 4.7 (calc) (ref <5.0)
Triglycerides: 159 mg/dL — ABNORMAL HIGH (ref <150)

## 2018-10-17 LAB — MICROALBUMIN / CREATININE URINE RATIO
Creatinine, Urine: 74 mg/dL (ref 20–275)
Microalb Creat Ratio: 7 mcg/mg creat (ref <30)
Microalb, Ur: 0.5 mg/dL

## 2018-10-17 LAB — URINALYSIS, ROUTINE W REFLEX MICROSCOPIC
Bilirubin Urine: NEGATIVE
Glucose, UA: NEGATIVE
Hgb urine dipstick: NEGATIVE
Ketones, ur: NEGATIVE
Leukocytes,Ua: NEGATIVE
Nitrite: NEGATIVE
Protein, ur: NEGATIVE
Specific Gravity, Urine: 1.012 (ref 1.001–1.03)
pH: 6.5 (ref 5.0–8.0)

## 2018-10-17 LAB — COMPLETE METABOLIC PANEL WITH GFR
AG Ratio: 1.2 (calc) (ref 1.0–2.5)
ALT: 21 U/L (ref 6–29)
AST: 18 U/L (ref 10–30)
Albumin: 3.9 g/dL (ref 3.6–5.1)
Alkaline phosphatase (APISO): 78 U/L (ref 31–125)
BUN: 10 mg/dL (ref 7–25)
CO2: 28 mmol/L (ref 20–32)
Calcium: 9.3 mg/dL (ref 8.6–10.2)
Chloride: 102 mmol/L (ref 98–110)
Creat: 0.86 mg/dL (ref 0.50–1.10)
GFR, Est African American: 97 mL/min/{1.73_m2} (ref >=60)
GFR, Est Non African American: 83 mL/min/{1.73_m2} (ref >=60)
Globulin: 3.2 g/dL (calc) (ref 1.9–3.7)
Glucose, Bld: 92 mg/dL (ref 65–99)
Potassium: 4.2 mmol/L (ref 3.5–5.3)
Sodium: 137 mmol/L (ref 135–146)
Total Bilirubin: 0.4 mg/dL (ref 0.2–1.2)
Total Protein: 7.1 g/dL (ref 6.1–8.1)

## 2018-10-17 LAB — MAGNESIUM: Magnesium: 2.1 mg/dL (ref 1.5–2.5)

## 2018-10-17 LAB — HEMOGLOBIN A1C
Hgb A1c MFr Bld: 5.7 % of total Hgb — ABNORMAL HIGH (ref <5.7)
Mean Plasma Glucose: 117 (calc)
eAG (mmol/L): 6.5 (calc)

## 2018-10-17 LAB — VITAMIN D 25 HYDROXY (VIT D DEFICIENCY, FRACTURES): Vit D, 25-Hydroxy: 22 ng/mL — ABNORMAL LOW (ref 30–100)

## 2018-10-17 LAB — VITAMIN B12: Vitamin B-12: 292 pg/mL (ref 200–1100)

## 2018-10-17 LAB — IRON, TOTAL/TOTAL IRON BINDING CAP
%SAT: 29 % (calc) (ref 16–45)
Iron: 76 ug/dL (ref 40–190)
TIBC: 265 mcg/dL (calc) (ref 250–450)

## 2018-10-17 LAB — TSH: TSH: 1.2 mIU/L

## 2018-10-17 LAB — FERRITIN: Ferritin: 117 ng/mL (ref 16–232)

## 2018-10-17 MED ORDER — ROSUVASTATIN CALCIUM 5 MG PO TABS: 5.0000 mg | ORAL_TABLET | Freq: Every day | ORAL | 0 refills | Status: DC

## 2018-11-29 ENCOUNTER — Ambulatory Visit
Admission: RE | Admit: 2018-11-29 | Discharge: 2018-11-29 | Disposition: A | Discharge status: Home / Self Care | Payer: BC Managed Care – PPO | Source: Ambulatory Visit | Attending: Adult Health | Admitting: Adult Health

## 2018-11-29 ENCOUNTER — Other Ambulatory Visit: Payer: Self-pay

## 2018-11-29 ENCOUNTER — Encounter: Payer: Self-pay | Admitting: Adult Health

## 2018-11-29 ENCOUNTER — Ambulatory Visit: Payer: BC Managed Care – PPO | Admitting: Adult Health

## 2018-11-29 VITALS — BP 110/76 | HR 89 | Temp 96.8°F | Ht 61.5 in | Wt 235.0 lb

## 2018-11-29 DIAGNOSIS — M792 Neuralgia and neuritis, unspecified: Secondary | ICD-10-CM

## 2018-11-29 MED ORDER — PREDNISONE 20 MG PO TABS: ORAL_TABLET | ORAL | 0 refills | Status: DC

## 2018-11-29 NOTE — Progress Notes (Signed)
Assessment and Plan:  Kendra Stein was seen today for arm pain.  Diagnoses and all orders for this visit:  Radicular pain of right upper extremity No red flags, impingement - cervical vs distal, will get cervical imaging due to bilateral symptoms at one point Prednisone was prescribed,NSAIDs, RICE, and exercise given Natural history and expected course discussed. Questions answered. Neurosurgeon distributed. Stretching exercises discussed. Ice to affected area as needed for local pain relief - try midline over neck Heat to affected area as needed for local pain relief - try over shoulders for tense muscles, and has muscle relaxer at home - take in the evening with heat and ROM/stretching Avoid maintaining positions that strain neck, get new cervical pillow at night OTC analgesics- aleve - take regularly for 2 weeks then PRN If not better follow up in office or will refer to PT/orthopedics. -     predniSONE (DELTASONE) 20 MG tablet; 2 tablets daily for 3 days, 1 tablet daily for 4 days. -     DG Cervical Spine Complete; Future  Further disposition pending results of labs. Discussed med's effects and SE's.   Over 15 minutes of exam, counseling, chart review, and critical decision making was performed.   Future Appointments  Date Time Provider [place]  12/29/2018 10:00 AM [facility] MM 3 [facility]  04/17/2019  9:30 AM Vicie Mutters, PA-C [facility] None  10/22/2019 10:00 AM Vicie Mutters, PA-C [facility] None    ------------------------------------------------------------------------------------------------------------------   HPI BP 110/76   Pulse 89   Temp (!) 96.8 F (36 C)   Ht 5' 1.5" (1.562 m)   Wt 235 lb (106.6 kg)   SpO2 99%   BMI 43.68 kg/m   43 y.o.female, AA, works as Recruitment consultant presents for evaluation of intermittent persistent bilateral 4th digit/hand/arm numbness, ? Sensation of Swelling of 4th digit, worst at night. Has been going on  intermittently for several months, though reports has been suddenly worse in the last 1-2 weeks, was the worst it's been last night and presents for evaluation. She denies any neck pain, notable weakness in extremity. She denies any pain. She reports sensation currently in 4th digit of R hand, up to palm/wrist, but yesterday evening extended up to just past elbow. She reports intermittently has similar on left.   Recently started taking aleve BID this past week without notable improvement so far  No hx of neck injury, though has been in [facility],   Past Medical History:  Diagnosis Date  . Abnormal Pap smear   . Cervical polyp   . Depression   . Headache(784.0)   . Migraines   . Morbid obesity ([place])   . Prediabetes   . Toxemia in pregnancy   . Toxemia in pregnancy   . Umbilical hernia    SOME ABDOMINAL DISCOMFORT  . UTI (urinary tract infection)    ON MEDICATION AS OF 6/10 FOR UTI - HX OF FREQUENT UTI'S  . Vitamin D deficiency      No Known Allergies  Current Outpatient Medications on File Prior to Visit  Medication Sig  . buPROPion (WELLBUTRIN XL) 150 MG 24 hr tablet Take 1 tablet (150 mg total) by mouth daily.  . Cholecalciferol 5000 units TABS Take 5,000 Units by mouth daily.  . hydrochlorothiazide (MICROZIDE) 12.5 MG capsule Take 1 capsule (12.5 mg total) by mouth daily.  Marland Kitchen ibuprofen (ADVIL,MOTRIN) 200 MG tablet Take 600 mg by mouth every 6 (six) hours as needed for mild pain.  . methocarbamol (ROBAXIN)  500 MG tablet Take 1 tablet (500 mg total) by mouth every 8 (eight) hours as needed for muscle spasms.  . Multiple Vitamins-Minerals (MULTIVITAMIN GUMMIES ADULT PO) Take by mouth. DAILY  . naproxen (NAPROSYN) 500 MG tablet Take 1 tablet (500 mg total) by mouth 2 (two) times daily. (Patient taking differently: Take 500 mg by mouth as needed. )  . phentermine (ADIPEX-P) 37.5 MG tablet TAKE ONE TABLET BY MOUTH DAILY BEFORE BREAKFAST  . rosuvastatin (CRESTOR) 5 MG tablet Take 1 tablet (5  mg total) by mouth daily.  . SUMAtriptan (IMITREX) 100 MG tablet Take 1 tablet (100 mg total) by mouth once as needed for migraine. May repeat in 2 hours if headache persists or recurs.  Marland Kitchen etodolac (LODINE) 400 MG tablet Take 1 tablet (400 mg total) by mouth 2 (two) times daily as needed. (Patient not taking: Reported on 11/29/2018)   No current facility-administered medications on file prior to visit.     ROS: all negative except above.   Physical Exam:  BP 110/76   Pulse 89   Temp (!) 96.8 F (36 C)   Ht 5' 1.5" (1.562 m)   Wt 235 lb (106.6 kg)   SpO2 99%   BMI 43.68 kg/m   General Appearance: Well nourished, in no apparent distress. Eyes: PERRLA, conjunctiva no swelling or erythema ENT/Mouth: Hearing normal.  Neck: Supple, thyroid normal.  Respiratory: Respiratory effort normal, BS equal bilaterally without rales, rhonchi, wheezing or stridor.  Cardio: RRR with no MRGs. Brisk peripheral pulses without edema bilateral upper extremities.  Lymphatics: Non tender without lymphadenopathy.  Musculoskeletal: Full ROM, 5/5 strength bilaterally, normal gait. NO spinous tenderness, Spurling's negative, shoulder abduction test negative, she does have some tension/tenderness through trap musculature Skin: Warm, dry without rashes, lesions, ecchymosis.  Neuro: Cranial nerves intact. Normal muscle tone, no cerebellar symptoms. Some decreased sensation through R hand 4th digit through palm, bilateral upper extremity reflexes intact/symmetrical - triceps, biceps, brachioradialis, phalen's negative Psych: Awake and oriented X 3, normal affect, Insight and Judgment appropriate.     Izora Ribas, NP 3:40 PM [facility]

## 2018-11-29 NOTE — Patient Instructions (Signed)
Get a neck neutral pillow - try to sleep on your back     Pinched Nerve A pinched nerve is an injury that occurs when too much pressure is placed on a nerve. This pressure can cause pain, burning, and muscle weakness in places that the nerve supplies feeling to, such as an arm, hand, or leg, or the back or neck. If a nerve is severely pinched or has been pinched for a long time, permanent nerve damage can occur. What are the causes? This condition may be caused by:  A nerve passing through a narrow area between bones or other body structures.  Arthritis that causes bones to press on a nerve.  Loss of blood supply to a nerve.  A nerve being stretched from an injury.  A sudden injury with swelling.  Long-term wear on the nerve.  Age-related changes in the spine. What are the signs or symptoms? The most common symptoms of a pinched nerve are feeling a tingling sensation and numbness. Other symptoms include:  Pain that spreads from one area of the body part to another.  A burning feeling.  Muscle weakness. How is this diagnosed? This condition may be diagnosed based on:  A physical exam. During the exam, your health care provider will: ? Check for numbness and muscle weakness. ? Move affected body parts to test for pain.  X-rays to check for bone damage.  A MRI or CT scan to check for conditions that may be causing nerve damage.  A muscle test (electromyogram, EMG) to evaluate how your muscles and nerves communicate. How is this treated?     A pinched nerve is usually treated first by:  Resting the affected body area.  Using devices to help you move without pain (supportive or protective devices), such as a splint, brace, or neck collar. Other treatments depend on your symptoms and the amount of nerve damage you have. Other treatments may include:  Medicines, such as: ? Injections of numbing medicine. ? NSAIDs. ? Pain medicines. ? Steroid medicines. These may be  given as a pill or as an injection.  Physical therapy to relieve pain, maintain movement, and improve muscle strength.  Surgery. This may be done if other treatments do not work. Follow these instructions at home:      Take over-the-counter and prescription medicines only as told by your health care provider.  Wear supportive or protective devices as told by your health care provider.  Do physical therapy exercises as directed.  Ask your health care provider what activities are safe for you.  Rest as needed.  If directed, put ice on the affected area: ? Put ice in a plastic bag. ? Place a towel between your skin and the bag. ? Leave the ice on for 20 minutes, 2-3 times a day.  If directed, apply heat to the affected area. Use the heat source that your health care provider recommends, such as a moist heat pack or a heating pad. ? Place a towel between your skin and the heat source. ? Leave the heat on for 20-30 minutes. ? Remove the heat if your skin turns bright red. This is especially important if you are unable to feel pain, heat, or cold. You may have a greater risk of getting burned.  Do not drive or use heavy machinery while taking prescription pain medicine.  Keep all follow-up visits as told by your health care provider. This is important. Contact a health care provider if:  Your condition  does not improve with treatment.  Your pain, numbness, or weakness suddenly gets worse. Get help right away if you:  Have loss of bladder control (urinary incontinence) or you cannot urinate.  Cannot control bowel movements (fecal incontinence).  Have new weakness in your arms or legs. Summary  A pinched nerve is an injury that occurs when too much pressure is placed on a nerve.  This pressure can cause pain, burning, and muscle weakness in places that the nerve supplies feeling to, such as an arm, hand, or leg, or the back or neck.  Take over-the-counter and prescription  medicines only as told by your health care provider.  Ask your health care provider what activities are safe for you while you are having symptoms. This information is not intended to replace advice given to you by your health care provider. Make sure you discuss any questions you have with your health care provider. Document Released: 06/18/2002 Document Revised: 07/12/2017 Document Reviewed: 07/12/2017 Elsevier Interactive Patient Education  2019 [employer]     Neck Exercises Neck exercises can be important for many reasons:  They can help you to improve and maintain flexibility in your neck. This can be especially important as you age.  They can help to make your neck stronger. This can make movement easier.  They can reduce or prevent neck pain.  They may help your upper back. Ask your health care provider which neck exercises would be best for you. Exercises to improve neck flexibility Neck stretch Repeat this exercise 3-5 times. 1. Do this exercise while standing or while sitting in a chair. 2. Place your feet flat on the floor, shoulder-width apart. 3. Slowly turn your head to the right. Turn it all the way to the right so you can look over your right shoulder. Do not tilt or tip your head. 4. Hold this position for 10-30 seconds. 5. Slowly turn your head to the left, to look over your left shoulder. 6. Hold this position for 10-30 seconds.  Neck retraction Repeat this exercise 8-10 times. Do this 3-4 times a day or as told by your health care provider. 1. Do this exercise while standing or while sitting in a sturdy chair. 2. Look straight ahead. Do not bend your neck. 3. Use your fingers to push your chin backward. Do not bend your neck for this movement. Continue to face straight ahead. If you are doing the exercise properly, you will feel a slight sensation in your throat and a stretch at the back of your neck. 4. Hold the stretch for 1-2 seconds. Relax and repeat.  Exercises to improve neck strength Neck press Repeat this exercise 10 times. Do it first thing in the morning and right before bed or as told by your health care provider. 1. Lie on your back on a firm bed or on the floor with a pillow under your head. 2. Use your neck muscles to push your head down on the pillow and straighten your spine. 3. Hold the position as well as you can. Keep your head facing up and your chin tucked. 4. Slowly count to 5 while holding this position. 5. Relax for a few seconds. Then repeat. Isometric strengthening Do a full set of these exercises 2 times a day or as told by your health care provider. 1. Sit in a supportive chair and place your hand on your forehead. 2. Push forward with your head and neck while pushing back with your hand. Hold for 10  seconds. 3. Relax. Then repeat the exercise 3 times. 4. Next, do thesequence again, this time putting your hand against the back of your head. Use your head and neck to push backward against the hand pressure. 5. Finally, do the same exercise on either side of your head, pushing sideways against the pressure of your hand. Prone head lifts Repeat this exercise 5 times. Do this 2 times a day or as told by your health care provider. 1. Lie face-down, resting on your elbows so that your chest and upper back are raised. 2. Start with your head facing downward, near your chest. Position your chin either on or near your chest. 3. Slowly lift your head upward. Lift until you are looking straight ahead. Then continue lifting your head as far back as you can stretch. 4. Hold your head up for 5 seconds. Then slowly lower it to your starting position. Supine head lifts Repeat this exercise 8-10 times. Do this 2 times a day or as told by your health care provider. 1. Lie on your back, bending your knees to point to the ceiling and keeping your feet flat on the floor. 2. Lift your head slowly off the floor, raising your chin toward  your chest. 3. Hold for 5 seconds. 4. Relax and repeat. Scapular retraction Repeat this exercise 5 times. Do this 2 times a day or as told by your health care provider. 1. Stand with your arms at your sides. Look straight ahead. 2. Slowly pull both shoulders backward and downward until you feel a stretch between your shoulder blades in your upper back. 3. Hold for 10-30 seconds. 4. Relax and repeat. Contact a health care provider if:  Your neck pain or discomfort gets much worse when you do an exercise.  Your neck pain or discomfort does not improve within 2 hours after you exercise. If you have any of these problems, stop exercising right away. Do not do the exercises again unless your health care provider says that you can. Get help right away if:  You develop sudden, severe neck pain. If this happens, stop exercising right away. Do not do the exercises again unless your health care provider says that you can. This information is not intended to replace advice given to you by your health care provider. Make sure you discuss any questions you have with your health care provider. Document Released: 06/09/2015 Document Revised: 11/01/2017 Document Reviewed: 01/06/2015 Elsevier Interactive Patient Education  2019 [employer].

## 2018-12-29 ENCOUNTER — Ambulatory Visit
Admission: RE | Admit: 2018-12-29 | Discharge: 2018-12-29 | Disposition: A | Discharge status: Home / Self Care | Payer: BC Managed Care – PPO | Source: Ambulatory Visit | Attending: Internal Medicine | Admitting: Internal Medicine

## 2018-12-29 ENCOUNTER — Other Ambulatory Visit: Payer: Self-pay

## 2018-12-29 DIAGNOSIS — Z1231 Encounter for screening mammogram for malignant neoplasm of breast: Secondary | ICD-10-CM

## 2019-01-30 ENCOUNTER — Other Ambulatory Visit: Payer: Self-pay | Admitting: Physician Assistant

## 2019-03-05 ENCOUNTER — Other Ambulatory Visit: Payer: Self-pay | Admitting: Adult Health

## 2019-03-05 DIAGNOSIS — I10 Essential (primary) hypertension: Secondary | ICD-10-CM

## 2019-04-16 ENCOUNTER — Encounter: Payer: Self-pay | Admitting: Adult Health Nurse Practitioner

## 2019-04-16 NOTE — Progress Notes (Signed)
6 MONTH FOLLOW UP  Assessment and Plan:   Kendra Stein was seen today for follow-up.  Diagnoses and all orders for this visit:  Essential hypertension Continue current medications:HCTZ 12.5mg  daily Monitor blood pressure at home; call if consistently over 130/80 Continue DASH diet.   Reminder to go to the ER if any CP, SOB, nausea, dizziness, severe HA, changes vision/speech, left arm numbness and tingling and jaw pain. -     CBC with Differential/Platelet -     COMPLETE METABOLIC PANEL WITH GFR -     Magnesium  Mixed hyperlipidemia Continue medications:Crestor 5mg  nightly Discussed dietary and exercise modifications Low fat diet -     Lipid panel  Gastroesophageal reflux disease, unspecified whether esophagitis present Doing well at this time controlled Monitor for triggers Controlled with diet and OTC PRN -     Magnesium  Vitamin D deficiency Continue supplementation -     VITAMIN D 25 Hydroxy (Vit-D Deficiency)  Abnormal glucose Discussed dietary and exercise modifications -     Hemoglobin A1c  Migraine without status migrainosus, not intractable, unspecified migraine type Doing well at this time Asymptomatic  Morbid obesity with BMI of 40.0-44.9, adult ([place]) Discussed dietary and exercise modifications Not taking phentermine at this time, do not restart at this time. Discussed weight loss program in office Consider this after resolution of radicular pain / follow up  Medication management Continued -     CBC with Differential/Platelet -     COMPLETE METABOLIC PANEL WITH GFR -     Magnesium -     Lipid panel -     Hemoglobin A1c -     VITAMIN D 25 Hydroxy (Vit-D Deficiency, Fractures)  Radicular pain of right upper extremity Discussed ice and heat to neck, likely cause of pain. Discussed imaging and or referral Neurology/Ortho if no improvement -     predniSONE (STERAPRED UNI-PAK 21 TAB) 10 MG (21) TBPK tablet; Take by mouth daily. Follow directions on Taper  pack Discussed medication and side effects, take with food.  Need for influenza vaccination -     Flu vaccine greater than or equal to 3yo with preservative IM Received today Discussed hospital precautions with patient and agrees with plan of care.  We will contact you in 1-3 business days with lab results drawn today.  Follow up in 2 week(s) for radicular pain and discuss weight loss program.  Call or return with new or worsening symptoms as discussed in appointment.  May contact office via phone [phone] or [place].  Discussed med's effects and SE's. Screening labs and tests as requested with regular follow-up as recommended. Over 40 minutes of interview exam, counseling, chart review, and complex, high level critical decision making was performed this visit.   HPI  43 y.o. female  presents for a 62month follow up.  She reports that her right hand has been achy as well as numbness and tingling.  She has tried taking naproxen daily.  She has also tried muscles relaxers at night.  She reports that she is able to sleep through the night.  When she does not take this it will wake her up at night.  She reports it used to only bother her at night.  She also switches pillows.  She now reports this is occurring during the day.  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  She also used to braid hair which  she has not done this lately related to decreased endurance of fine motor use.   She is prescribed phentermine but has not taken this in the past two months.  Reports that she would like to get back to working on this.  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  She does workout. She denies chest pain, shortness of breath, dizziness.  BMI is Body mass index is 43.68 kg/m., she is working on diet and  exercise. Wt Readings from Last 3 Encounters:  11/29/18 235 lb (106.6 kg)  10/16/18 239 lb (108.4 kg)  08/01/18 234 lb (106.1 kg)    She is not on cholesterol medication and denies myalgias. Her cholesterol is at goal. The cholesterol last visit was:   Lab Results  Component Value Date   CHOL 220 (H) 10/16/2018   HDL 47 (L) 10/16/2018   LDLCALC 144 (H) 10/16/2018   TRIG 159 (H) 10/16/2018   CHOLHDL 4.7 10/16/2018   She has been working on diet and exercise for prediabetes,  and denies paresthesia of the feet, polydipsia, polyuria and visual disturbances. Last A1C in the office was:  Lab Results  Component Value Date   HGBA1C 5.7 (H) 10/16/2018   Patient is on Vitamin D supplement.   Lab Results  Component Value Date   VD25OH 22 (L) 10/16/2018     Has not been having as many headaches with eating better and more consistently.   Current Medications:  Current Outpatient Medications on File Prior to Visit  Medication Sig Dispense Refill  . buPROPion (WELLBUTRIN XL) 150 MG 24 hr tablet TAKE ONE TABLET BY MOUTH DAILY 90 tablet 0  . Cholecalciferol 5000 units TABS Take 5,000 Units by mouth daily.    Marland Kitchen etodolac (LODINE) 400 MG tablet Take 1 tablet (400 mg total) by mouth 2 (two) times daily as needed. 60 tablet 3  . hydrochlorothiazide (MICROZIDE) 12.5 MG capsule TAKE ONE CAPSULE BY MOUTH DAILY 90 capsule 0  . ibuprofen (ADVIL,MOTRIN) 200 MG tablet Take 600 mg by mouth every 6 (six) hours as needed for mild pain.    . methocarbamol (ROBAXIN) 500 MG tablet Take 1 tablet (500 mg total) by mouth every 8 (eight) hours as needed for muscle spasms. 60 tablet 0  . Multiple Vitamins-Minerals (MULTIVITAMIN GUMMIES ADULT PO) Take by mouth. DAILY    . naproxen (NAPROSYN) 500 MG tablet Take 1 tablet (500 mg total) by mouth 2 (two) times daily. (Patient taking differently: Take 500 mg by mouth as needed. ) 30 tablet 0  . phentermine (ADIPEX-P) 37.5 MG tablet TAKE ONE TABLET BY MOUTH DAILY BEFORE  BREAKFAST 30 tablet 2  . rosuvastatin (CRESTOR) 5 MG tablet TAKE ONE TABLET BY MOUTH DAILY 90 tablet 0  . SUMAtriptan (IMITREX) 100 MG tablet Take 1 tablet (100 mg total) by mouth once as needed for migraine. May repeat in 2 hours if headache persists or recurs. 6 tablet 2   No current facility-administered medications on file prior to visit.    Health Maintenance:   Immunization History  Administered Date(s) Administered  . Influenza Split 04/17/2019  . Influenza, Seasonal, Injecte, Preservative Fre 07/26/2016  . Influenza-Unspecified 04/28/2018, 02/19/2019  . Tdap 01/10/2011, 08/07/2016   Tetanus: 2018 Pneumovax: N/A Prevnar 13: N/A Flu vaccine: 2018 Zostavax:N/A  LMP: s/p ablation Pap: 2019 MGM: 07/2017 DUe will have to get  after COVID DEXA: N/A Colonoscopy: N/A EGD: N/A MRI brain 2006 [place] AB 2015 CXR 2017  Patient Care Team: Unk Pinto, MD as PCP - General (Internal Medicine) Everett Graff, MD as Consulting Physician (Obstetrics and Gynecology)  Medical History:  Past Medical History:  Diagnosis Date  . Abnormal Pap smear   . Cervical polyp   . Depression   . Headache(784.0)   . Migraines   . Morbid obesity ([place])   . Prediabetes   . Toxemia in pregnancy   . Toxemia in pregnancy   . Umbilical hernia    SOME ABDOMINAL DISCOMFORT  . UTI (urinary tract infection)    ON MEDICATION AS OF 6/10 FOR UTI - HX OF FREQUENT UTI'S  . Vitamin D deficiency    Allergies No Known Allergies  SURGICAL HISTORY She  has a past surgical history that includes Cryotherapy; Endometrial ablation; Tubal ligation; Wisdom tooth extraction; Cesarean section; and Umbilical hernia repair (N/A, 12/27/2013). FAMILY HISTORY Her family history includes Arthritis in her mother; Asthma in her daughter; Breast cancer in her maternal aunt; Hypertension in her brother and mother. SOCIAL HISTORY She  reports that she has never smoked. She has never used smokeless tobacco. She reports  current alcohol use. She reports that she does not use drugs.   Review of Systems: Review of Systems  Constitutional: Negative for chills, diaphoresis, fever, malaise/fatigue and weight loss.  HENT: Negative for congestion, ear discharge, ear pain, hearing loss, nosebleeds, sinus pain, sore throat and tinnitus.   Eyes: Negative for blurred vision, double vision, photophobia, pain, discharge and redness.  Respiratory: Negative for cough, hemoptysis, sputum production, shortness of breath, wheezing and stridor.   Cardiovascular: Negative for chest pain, palpitations, orthopnea, claudication, leg swelling and PND.  Gastrointestinal: Negative for abdominal pain, blood in stool, constipation, diarrhea, heartburn, melena, nausea and vomiting.  Genitourinary: Negative for dysuria, flank pain, frequency, hematuria and urgency.  Musculoskeletal: Negative for back pain, falls, joint pain, myalgias and neck pain.  Skin: Negative for itching and rash.  Neurological: Positive for sensory change. Negative for dizziness, tingling, tremors, speech change, focal weakness, seizures, loss of consciousness, weakness and headaches.       Numbness tingling right hand, shooting pains, intermittent to right forearm.  Becoming more constant.  Endo/Heme/Allergies: Negative for environmental allergies and polydipsia. Does not bruise/bleed easily.  Psychiatric/Behavioral: Negative for depression, hallucinations, memory loss, substance abuse and suicidal ideas. The patient is not nervous/anxious and does not have insomnia.     Physical Exam: Estimated body mass index is 43.68 kg/m as calculated from the following:   Height as of this encounter: 5' 1.5" (1.562 m).   Weight as of 11/29/18: 235 lb (106.6 kg). Ht 5' 1.5" (1.562 m)   BMI 43.68 kg/m    General Appearance: Well nourished, in no apparent distress.  Eyes: PERRLA, EOMs, conjunctiva no swelling or erythema, normal fundi and vessels.  Sinuses: No  Frontal/maxillary tenderness  ENT/Mouth: Ext aud canals clear, normal light reflex with TMs without erythema, bulging. Good dentition. No erythema, swelling, or exudate on post pharynx. Tonsils not swollen or erythematous. Hearing normal.  Neck: Supple, thyroid normal. No bruits  Respiratory: Respiratory effort normal, BS equal bilaterally without rales, rhonchi, wheezing or stridor.  Cardio: RRR without murmurs, rubs or gallops. Brisk peripheral pulses without edema.  Chest: symmetric, with normal excursions and percussion.  Breasts: defer OB/GYN Abdomen: Soft obese, nontender, no guarding, rebound, hernias, masses, or organomegaly.  Lymphatics: Non tender without lymphadenopathy.  Genitourinary: defer OB/GYN  Musculoskeletal: Full ROM all peripheral extremities,5/5 strength, and normal gait. RUE 4/5. Skin: Warm, dry without rashes, lesions, ecchymosis. Neuro: Cranial nerves intact, reflexes equal bilaterally. Normal muscle tone, no cerebellar symptoms. Sensation impaired on right upper extremity, thumb 2nd & third finger, C6-7 dermatone.  Psych: Awake and oriented X 3, normal affect, Insight and Judgment appropriate.   Kendra Stein 10:48 AM [facility]

## 2019-04-17 ENCOUNTER — Encounter: Payer: Self-pay | Admitting: Adult Health Nurse Practitioner

## 2019-04-17 ENCOUNTER — Other Ambulatory Visit: Payer: Self-pay

## 2019-04-17 ENCOUNTER — Ambulatory Visit: Payer: BC Managed Care – PPO | Admitting: Physician Assistant

## 2019-04-17 ENCOUNTER — Ambulatory Visit: Payer: BC Managed Care – PPO | Admitting: Adult Health Nurse Practitioner

## 2019-04-17 VITALS — Ht 61.5 in

## 2019-04-17 DIAGNOSIS — K219 Gastro-esophageal reflux disease without esophagitis: Secondary | ICD-10-CM | POA: Diagnosis not present

## 2019-04-17 DIAGNOSIS — E559 Vitamin D deficiency, unspecified: Secondary | ICD-10-CM

## 2019-04-17 DIAGNOSIS — Z23 Encounter for immunization: Secondary | ICD-10-CM

## 2019-04-17 DIAGNOSIS — I10 Essential (primary) hypertension: Secondary | ICD-10-CM

## 2019-04-17 DIAGNOSIS — Z79899 Other long term (current) drug therapy: Secondary | ICD-10-CM

## 2019-04-17 DIAGNOSIS — E782 Mixed hyperlipidemia: Secondary | ICD-10-CM

## 2019-04-17 DIAGNOSIS — Z6841 Body Mass Index (BMI) 40.0 and over, adult: Secondary | ICD-10-CM

## 2019-04-17 DIAGNOSIS — M792 Neuralgia and neuritis, unspecified: Secondary | ICD-10-CM

## 2019-04-17 DIAGNOSIS — G43909 Migraine, unspecified, not intractable, without status migrainosus: Secondary | ICD-10-CM

## 2019-04-17 DIAGNOSIS — R7309 Other abnormal glucose: Secondary | ICD-10-CM

## 2019-04-17 MED ORDER — PREDNISONE 10 MG (21) PO TBPK: ORAL_TABLET | Freq: Every day | ORAL | 0 refills | Status: DC

## 2019-04-17 NOTE — Patient Instructions (Addendum)
You received your flu vaccination today.  You can have soreness at the injection site.  You can have a slightly elevated temperature and this is normal.   We are going to send in a prednisone taper pack for you.  His is to help reduce inflammation related to the pains in your right arm.    Apply ice to your back, between shoulder blades 2-3 times a day to see if this help with your symptoms   You may continue to take the flexeril as needed at night.  You may also take half a tablet if it is making you sleepy the next day.  Follow up in two weeks for your arm pain.  We are going to start a weight loss clinic for those folks who would like to take phentermine or other weight loss drugs.  Since this is a controlled substance we are following up with out patients every month.  This will allow [place] to better serve you with your weight management goals and a weight check.  Do not take this while on the prednisone since you have not been taking the medication for past two months.    Radicular Pain Radicular pain is a type of pain that spreads from your back or neck along a spinal nerve. Spinal nerves are nerves that leave the spinal cord and go to the muscles. Radicular pain is sometimes called radiculopathy, radiculitis, or a pinched nerve. When you have this type of pain, you may also have weakness, numbness, or tingling in the area of your body that is supplied by the nerve. The pain may feel sharp and burning. Depending on which spinal nerve is affected, the pain may occur in the:  Neck area (cervical radicular pain). You may also feel pain, numbness, weakness, or tingling in the arms.  Mid-spine area (thoracic radicular pain). You would feel this pain in the back and chest. This type is rare.  Lower back area (lumbar radicular pain). You would feel this pain as low back pain. You may feel pain, numbness, weakness, or tingling in the buttocks or legs. Sciatica is a type of lumbar radicular pain  that shoots down the back of the leg. Radicular pain occurs when one of the spinal nerves becomes irritated or squeezed (compressed). It is often caused by something pushing on a spinal nerve, such as one of the bones of the spine (vertebrae) or one of the round cushions between vertebrae (intervertebral disks). This can result from:  An injury.  Wear and tear or aging of a disk.  The growth of a bone spur that pushes on the nerve. Radicular pain often goes away when you follow instructions from your health care provider for relieving pain at home. Follow these instructions at home: Managing pain      If directed, put ice on the affected area: ? Put ice in a plastic bag. ? Place a towel between your skin and the bag. ? Leave the ice on for 20 minutes, 2-3 times a day.  If directed, apply heat to the affected area as often as told by your health care provider. Use the heat source that your health care provider recommends, such as a moist heat pack or a heating pad. ? Place a towel between your skin and the heat source. ? Leave the heat on for 20-30 minutes. ? Remove the heat if your skin turns bright red. This is especially important if you are unable to feel pain, heat, or cold. You  may have a greater risk of getting burned. Activity   Do not sit or rest in bed for long periods of time.  Try to stay as active as possible. Ask your health care provider what type of exercise or activity is best for you.  Avoid activities that make your pain worse, such as bending and lifting.  Do not lift anything that is heavier than 10 lb (4.5 kg), or the limit that you are told, until your health care provider says that it is safe.  Practice using proper technique when lifting items. Proper lifting technique involves bending your knees and rising up.  Do strength and range-of-motion exercises only as told by your health care provider or physical therapist. General instructions  Take  over-the-counter and prescription medicines only as told by your health care provider.  Pay attention to any changes in your symptoms.  Keep all follow-up visits as told by your health care provider. This is important. ? Your health care provider may send you to a physical therapist to help with this pain. Contact a health care provider if:  Your pain and other symptoms get worse.  Your pain medicine is not helping.  Your pain has not improved after a few weeks of home care.  You have a fever. Get help right away if:  You have severe pain, weakness, or numbness.  You have difficulty with bladder or bowel control. Summary  Radicular pain is a type of pain that spreads from your back or neck along a spinal nerve.  When you have radicular pain, you may also have weakness, numbness, or tingling in the area of your body that is supplied by the nerve.  The pain may feel sharp or burning.  Radicular pain may be treated with ice, heat, medicines, or physical therapy. This information is not intended to replace advice given to you by your health care provider. Make sure you discuss any questions you have with your health care provider. Document Released: 08/05/2004 Document Revised: 01/10/2018 Document Reviewed: 01/10/2018 Elsevier Patient Education  2020 [employer].

## 2019-04-18 ENCOUNTER — Other Ambulatory Visit: Payer: Self-pay | Admitting: Adult Health Nurse Practitioner

## 2019-04-18 DIAGNOSIS — E559 Vitamin D deficiency, unspecified: Secondary | ICD-10-CM

## 2019-04-18 DIAGNOSIS — M792 Neuralgia and neuritis, unspecified: Secondary | ICD-10-CM

## 2019-04-18 LAB — COMPLETE METABOLIC PANEL WITH GFR
AG Ratio: 1.3 (calc) (ref 1.0–2.5)
ALT: 17 U/L (ref 6–29)
AST: 19 U/L (ref 10–30)
Albumin: 4.1 g/dL (ref 3.6–5.1)
Alkaline phosphatase (APISO): 69 U/L (ref 31–125)
BUN: 11 mg/dL (ref 7–25)
CO2: 29 mmol/L (ref 20–32)
Calcium: 9.8 mg/dL (ref 8.6–10.2)
Chloride: 102 mmol/L (ref 98–110)
Creat: 0.83 mg/dL (ref 0.50–1.10)
GFR, Est African American: 100 mL/min/{1.73_m2} (ref >=60)
GFR, Est Non African American: 86 mL/min/{1.73_m2} (ref >=60)
Globulin: 3.2 g/dL (calc) (ref 1.9–3.7)
Glucose, Bld: 98 mg/dL (ref 65–99)
Potassium: 3.5 mmol/L (ref 3.5–5.3)
Sodium: 139 mmol/L (ref 135–146)
Total Bilirubin: 0.4 mg/dL (ref 0.2–1.2)
Total Protein: 7.3 g/dL (ref 6.1–8.1)

## 2019-04-18 LAB — LIPID PANEL
Cholesterol: 136 mg/dL (ref <200)
HDL: 55 mg/dL (ref >=50)
LDL Cholesterol (Calc): 63 mg/dL (calc)
Non-HDL Cholesterol (Calc): 81 mg/dL (calc) (ref <130)
Total CHOL/HDL Ratio: 2.5 (calc) (ref <5.0)
Triglycerides: 97 mg/dL (ref <150)

## 2019-04-18 LAB — CBC WITH DIFFERENTIAL/PLATELET
Absolute Monocytes: 552 cells/uL (ref 200–950)
Basophils Absolute: 28 {cells}/uL (ref 0–200)
Basophils Relative: 0.3 %
Eosinophils Absolute: 64 cells/uL (ref 15–500)
Eosinophils Relative: 0.7 %
HCT: 38.9 % (ref 35.0–45.0)
Hemoglobin: 12.8 g/dL (ref 11.7–15.5)
Lymphs Abs: 1877 cells/uL (ref 850–3900)
MCH: 26.5 pg — ABNORMAL LOW (ref 27.0–33.0)
MCHC: 32.9 g/dL (ref 32.0–36.0)
MCV: 80.5 fL (ref 80.0–100.0)
MPV: 9.6 fL (ref 7.5–12.5)
Monocytes Relative: 6 %
Neutro Abs: 6679 cells/uL (ref 1500–7800)
Neutrophils Relative %: 72.6 %
Platelets: 332 10*3/uL (ref 140–400)
RBC: 4.83 10*6/uL (ref 3.80–5.10)
RDW: 13.5 % (ref 11.0–15.0)
Total Lymphocyte: 20.4 %
WBC: 9.2 10*3/uL (ref 3.8–10.8)

## 2019-04-18 LAB — HEMOGLOBIN A1C
Hgb A1c MFr Bld: 5.7 % of total Hgb — ABNORMAL HIGH (ref <5.7)
Mean Plasma Glucose: 117 (calc)
eAG (mmol/L): 6.5 (calc)

## 2019-04-18 LAB — MAGNESIUM: Magnesium: 2 mg/dL (ref 1.5–2.5)

## 2019-04-18 LAB — VITAMIN D 25 HYDROXY (VIT D DEFICIENCY, FRACTURES): Vit D, 25-Hydroxy: 20 ng/mL — ABNORMAL LOW (ref 30–100)

## 2019-04-18 MED ORDER — CHOLECALCIFEROL 1.25 MG (50000 UT) PO CAPS: ORAL_CAPSULE | ORAL | 0 refills | Status: DC

## 2019-04-18 MED ORDER — METHOCARBAMOL 750 MG PO TABS: ORAL_TABLET | ORAL | 1 refills | Status: DC

## 2019-05-01 ENCOUNTER — Ambulatory Visit: Payer: BC Managed Care – PPO | Admitting: Adult Health Nurse Practitioner

## 2019-05-01 ENCOUNTER — Other Ambulatory Visit: Payer: Self-pay

## 2019-05-01 ENCOUNTER — Encounter: Payer: Self-pay | Admitting: Adult Health Nurse Practitioner

## 2019-05-01 VITALS — BP 126/84 | HR 68 | Temp 97.3°F | Ht 61.5 in | Wt 222.0 lb

## 2019-05-01 DIAGNOSIS — M792 Neuralgia and neuritis, unspecified: Secondary | ICD-10-CM

## 2019-05-01 MED ORDER — NAPROXEN 500 MG PO TABS: 500.0000 mg | ORAL_TABLET | ORAL | Status: DC | PRN

## 2019-05-01 MED ORDER — MELOXICAM 15 MG PO TABS: 15.0000 mg | ORAL_TABLET | Freq: Every day | ORAL | 0 refills | Status: DC

## 2019-05-01 MED ORDER — PHENTERMINE HCL 37.5 MG PO CAPS: 37.5000 mg | ORAL_CAPSULE | ORAL | 0 refills | Status: DC

## 2019-05-01 NOTE — Progress Notes (Signed)
6 MONTH FOLLOW UP  Assessment and Plan:   Kendra Stein was seen today for follow-up.  Diagnoses and all orders for this visit:  Radicular pain of right upper extremity  Continue ice and heat Take Meloxicam 15mg , daily with food for two weeks, then PRN. Discussed ice and heat to neck, likely cause of pain. May also use OTC topical muscle preparations, Icy Hot etc. Discussed imaging and or referral Neurology/Ortho if no improvement   Morbid Obesity BMI 41 (HCC) Discussed dietary and exercise modifications Start Phentermine 37.5mg  daily, 76min prior to first meal Increase water intake, 80oz or more a day. Decrease sodas and stop al sugary drinks.   Follow up in 1 month(s) for weight management.  Call or return with new or worsening symptoms as discussed in appointment.  May contact office via phone [phone] or [place].  Discussed med's effects and SE's. Screening labs and tests as requested with regular follow-up as recommended. Over 40 minutes of interview exam, counseling, chart review, and complex, high level critical decision making was performed this visit.   HPI  43 y.o. female  presents for follow up on radiculopathy.  Her last office visit was two weeks ago with initial reports noted below:  She reports that her right hand has been achy as well as numbness and tingling.  She has tried taking naproxen daily.  She has also tried muscles relaxers at night.  She reports that she is able to sleep through the night.  When she does not take this it will wake her up at night.  She reports it used to only bother her at night.  She also switches pillows.  She now reports this is occurring during the day.  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  She also used to braid hair which she has not done this lately related to decreased endurance of fine motor use.   Today she reports that she completed the  prednisone taper and her symptoms have improved.  She reports that the tingling has resolved and some of her fine motor has returned.  She was not able to try the muscle relaxer as the pharmacy had to obtain the medication.  She does plan on trying this one evening. She has some residual soreness to her right hand.  She has not been taking anything daily for this it has been managing.  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  She has not been doing any further painting or repetitive activities.  She is prescribed phentermine but has not taken this in the past two months.  Reports that she would like to get back to working on this.  She is going to restart this medication.  She has decreased her soda intake and watching the foods she eats.  She has tolerated the medication well in the past.   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  She does workout. She denies chest pain, shortness of breath, dizziness.  BMI is Body mass index is 41.27 kg/m., she is working on diet and exercise. Wt Readings from Last 3 Encounters:  05/01/19 222 lb (100.7 kg)  11/29/18 235 lb (106.6 kg)  10/16/18 239 lb (108.4 kg)  She is not on cholesterol medication and denies myalgias. Her cholesterol is at goal. The cholesterol last visit was:   Lab Results  Component Value Date   CHOL 220 (H) 10/16/2018   HDL 47 (L) 10/16/2018   LDLCALC 144 (H) 10/16/2018   TRIG 159 (H) 10/16/2018   CHOLHDL 4.7 10/16/2018   She has been working on diet and exercise for prediabetes,  and denies paresthesia of the feet, polydipsia, polyuria and visual disturbances. Last A1C in the office was:  Lab Results  Component Value Date   HGBA1C 5.7 (H) 10/16/2018   Patient is on Vitamin D supplement.   Lab Results  Component Value Date   VD25OH 22 (L) 10/16/2018     Has  not been having as many headaches with eating better and more consistently.   Current Medications:  Current Outpatient Medications on File Prior to Visit  Medication Sig Dispense Refill  . buPROPion (WELLBUTRIN XL) 150 MG 24 hr tablet TAKE ONE TABLET BY MOUTH DAILY 90 tablet 0  . Cholecalciferol 1.25 MG (50000 UT) capsule Take one tablet by mouth three days a week for twelve weeks. 36 capsule 0  . etodolac (LODINE) 400 MG tablet Take 1 tablet (400 mg total) by mouth 2 (two) times daily as needed. 60 tablet 3  . hydrochlorothiazide (MICROZIDE) 12.5 MG capsule TAKE ONE CAPSULE BY MOUTH DAILY 90 capsule 0  . ibuprofen (ADVIL,MOTRIN) 200 MG tablet Take 600 mg by mouth every 6 (six) hours as needed for mild pain.    . methocarbamol (ROBAXIN-750) 750 MG tablet Take one tablet by mouth at bedtime, as needed.  May take one tablet during the day as needed for muscles spasms. 60 tablet 1  . Multiple Vitamins-Minerals (MULTIVITAMIN GUMMIES ADULT PO) Take by mouth. DAILY    . phentermine (ADIPEX-P) 37.5 MG tablet TAKE ONE TABLET BY MOUTH DAILY BEFORE BREAKFAST 30 tablet 2  . rosuvastatin (CRESTOR) 5 MG tablet TAKE ONE TABLET BY MOUTH DAILY 90 tablet 0  . SUMAtriptan (IMITREX) 100 MG tablet Take 1 tablet (100 mg total) by mouth once as needed for migraine. May repeat in 2 hours if headache persists or recurs. 6 tablet 2   No current facility-administered medications on file prior to visit.    Health Maintenance:   Immunization History  Administered Date(s) Administered  . Influenza Split 04/17/2019  . Influenza, Seasonal, Injecte, Preservative Fre 07/26/2016  . Influenza-Unspecified 04/28/2018, 02/19/2019  . Tdap 01/10/2011, 08/07/2016   Tetanus: 2018 Pneumovax: N/A Prevnar 13: N/A Flu vaccine: 2018 Zostavax:N/A  LMP: s/p ablation Pap: 2019 MGM: 07/2017 DUe will have to get after COVID DEXA: N/A Colonoscopy: N/A EGD: N/A MRI brain 2006 [place] AB 2015 CXR 2017  Patient Care Team: Unk Pinto, MD as PCP - General (Internal Medicine) Everett Graff, MD as Consulting Physician (Obstetrics and Gynecology)  Medical History:  Past Medical History:  Diagnosis Date  . Abnormal Pap smear   . Cervical polyp   . Depression   . Headache(784.0)   . Migraines   . Morbid obesity ([place])   . Prediabetes   . Toxemia in pregnancy   . Toxemia in pregnancy   . Umbilical hernia    SOME ABDOMINAL DISCOMFORT  . UTI (urinary tract infection)    ON MEDICATION AS OF 6/10 FOR UTI - HX OF FREQUENT UTI'S  . Vitamin D deficiency    Allergies No Known Allergies  SURGICAL HISTORY She  has a past surgical history that includes Cryotherapy; Endometrial ablation;  Tubal ligation; Wisdom tooth extraction; Cesarean section; and Umbilical hernia repair (N/A, 12/27/2013). FAMILY HISTORY Her family history includes Arthritis in her mother; Asthma in her daughter; Breast cancer in her maternal aunt; Hypertension in her brother and mother. SOCIAL HISTORY She  reports that she has never smoked. She has never used smokeless tobacco. She reports current alcohol use. She reports that she does not use drugs.   Review of Systems: Review of Systems  Constitutional: Negative for chills, diaphoresis, fever, malaise/fatigue and weight loss.  HENT: Negative for congestion, ear discharge, ear pain, hearing loss, nosebleeds, sinus pain, sore throat and tinnitus.   Eyes: Negative for blurred vision, double vision, photophobia, pain, discharge and redness.  Respiratory: Negative for cough, hemoptysis, sputum production, shortness of breath, wheezing and stridor.   Cardiovascular: Negative for chest pain, palpitations, orthopnea, claudication, leg swelling and PND.  Gastrointestinal: Negative for abdominal pain, blood in stool, constipation, diarrhea, heartburn, melena, nausea and vomiting.  Genitourinary: Negative for dysuria, flank pain, frequency, hematuria and urgency.  Musculoskeletal: Negative for back pain,  falls, joint pain, myalgias and neck pain.  Skin: Negative for itching and rash.  Neurological: Positive for sensory change. Negative for dizziness, tingling, tremors, speech change, focal weakness, seizures, loss of consciousness, weakness and headaches.       Numbness tingling right hand, shooting pains, intermittent to right forearm.  Becoming more constant.  Endo/Heme/Allergies: Negative for environmental allergies and polydipsia. Does not bruise/bleed easily.  Psychiatric/Behavioral: Negative for depression, hallucinations, memory loss, substance abuse and suicidal ideas. The patient is not nervous/anxious and does not have insomnia.     Physical Exam: Estimated body mass index is 41.27 kg/m as calculated from the following:   Height as of this encounter: 5' 1.5" (1.562 m).   Weight as of this encounter: 222 lb (100.7 kg). BP 126/84   Pulse 68   Temp (!) 97.3 F (36.3 C)   Ht 5' 1.5" (1.562 m)   Wt 222 lb (100.7 kg)   SpO2 98%   BMI 41.27 kg/m    General Appearance: Well nourished, in no apparent distress.  Eyes: PERRLA, EOMs, conjunctiva no swelling or erythema, normal fundi and vessels.  Sinuses: No Frontal/maxillary tenderness  ENT/Mouth: Ext aud canals clear, normal light reflex with TMs without erythema, bulging. Good dentition. No erythema, swelling, or exudate on post pharynx. Tonsils not swollen or erythematous. Hearing normal.  Neck: Supple, thyroid normal. No bruits  Respiratory: Respiratory effort normal, BS equal bilaterally without rales, rhonchi, wheezing or stridor.  Cardio: RRR without murmurs, rubs or gallops. Brisk peripheral pulses without edema.  Chest: symmetric, with normal excursions and percussion.  Breasts: defer OB/GYN Abdomen: Soft obese, nontender, no guarding, rebound, hernias, masses, or organomegaly.  Lymphatics: Non tender without lymphadenopathy.  Genitourinary: defer OB/GYN Musculoskeletal: Full ROM all peripheral extremities,5/5 strength, and  normal gait. RUE 5/5. Phalen & Tinel sign: Negative Tenderness to Adductor pollicis, flexor pollicis brevis, opponens pollicis and abductor pollicis brevis. Skin: Warm, dry without rashes, lesions, ecchymosis. Neuro: Cranial nerves intact, reflexes equal bilaterally. Normal muscle tone, no cerebellar symptoms. Sensation impaired on right upper extremity, thumb 2nd & third finger, C6-7 dermatone.  Psych: Awake and oriented X 3, normal affect, Insight and Judgment appropriate.   Simon Aaberg 10:40 AM [facility]

## 2019-05-01 NOTE — Patient Instructions (Addendum)
We are going to send in Meloxicam (Mobic) 15mg  for you to tale once a day.  Take this with food.  Take once a day for two weeks.  After that you may take one as needed.    Meloxicam capsules What is this medicine? MELOXICAM (mel OX i cam) is a non-steroidal anti-inflammatory drug (NSAID). It is used to reduce swelling and to treat pain. It is used for osteoarthritis. This medicine may be used for other purposes; ask your health care provider or pharmacist if you have questions. COMMON BRAND NAME(S): Vivlodex What should I tell my health care provider before I take this medicine? They need to know if you have any of these conditions:  bleeding disorders  cigarette smoker  coronary artery bypass graft (CABG) surgery within the past 2 weeks  drink more than 3 alcohol-containing drinks per day  heart disease  high blood pressure  history of stomach bleeding  kidney disease  liver disease  lung or breathing disease, like asthma  stomach or intestine problems  an unusual or allergic reaction to meloxicam, aspirin, other NSAIDs, other medicines, foods, dyes, or preservatives  pregnant or trying to get pregnant  breast-feeding How should I use this medicine? Take this medicine by mouth with a full glass of water. Follow the directions on the prescription label. You can take it with or without food. If it upsets your stomach, take it with food. Take your medicine at regular intervals. Do not take it more often than directed. Do not stop taking except on your doctor's advice. A special MedGuide will be given to you by the pharmacist with each prescription and refill. Be sure to read this information carefully each time. Talk to your pediatrician regarding the use of this medicine in children. Special care may be needed. Patients over 7 years old may have a stronger reaction and need a smaller dose. Overdosage: If you think you have taken too much of this medicine contact a poison  control center or emergency room at once. NOTE: This medicine is only for you. Do not share this medicine with others. What if I miss a dose? If you miss a dose, take it as soon as you can. If it is almost time for your next dose, take only that dose. Do not take double or extra doses. What may interact with this medicine? Do not take this medicine with any of the following medications:  cidofovir  ketorolac This medicine may also interact with the following medications:  aspirin and aspirin-like medicines  certain medicines for blood pressure, heart disease, irregular heart beat  certain medicines for depression, anxiety, or psychotic disturbances  certain medicines that treat or prevent blood clots like warfarin, enoxaparin, dalteparin, apixaban, dabigatran, rivaroxaban  cyclosporine  diuretics  fluconazole  lithium  methotrexate  other NSAIDs, medicines for pain and inflammation, like ibuprofen and naproxen  pemetrexed This list may not describe all possible interactions. Give your health care provider a list of all the medicines, herbs, non-prescription drugs, or dietary supplements you use. Also tell them if you smoke, drink alcohol, or use illegal drugs. Some items may interact with your medicine. What should I watch for while using this medicine? Tell your doctor or healthcare provider if your symptoms do not start to get better or if they get worse. This medicine may cause serious skin reactions. They can happen weeks to months after starting the medicine. Contact your healthcare provider right away if you notice fevers or flu-like symptoms  with a rash. The rash may be red or purple and then turn into blisters or peeling of the skin. Or, you might notice a red rash with swelling of the face, lips or lymph nodes in your neck or under your arms. Do not take other medicines that contain aspirin, ibuprofen, or naproxen with this medicine. Side effects such as stomach upset,  nausea, or ulcers may be more likely to occur. Many medicines available without a prescription should not be taken with this medicine. This medicine can cause ulcers and bleeding in the stomach and intestines at any time during treatment. This can happen with no warning and may cause death. There is increased risk with taking this medicine for a long time. Smoking, drinking alcohol, older age, and poor health can also increase risks. Call your doctor right away if you have stomach pain or blood in your vomit or stool. This medicine does not prevent heart attack or stroke. In fact, this medicine may increase the chance of a heart attack or stroke. The chance may increase with longer use of this medicine and in people who have heart disease. If you take aspirin to prevent heart attack or stroke, talk with your doctor or healthcare provider. What side effects may I notice from receiving this medicine? Side effects that you should report to your doctor or health care professional as soon as possible:  allergic reactions like skin rash, itching or hives, swelling of the face, lips, or tongue  nausea, vomiting  redness, blistering, peeling, or loosening of the skin, including inside the mouth  signs and symptoms of a blood clot such as breathing problems; changes in vision; chest pain; severe, sudden headache; pain, swelling, warmth in the leg; trouble speaking; sudden numbness or weakness of the face, arm, or leg  signs and symptoms of bleeding such as bloody or black, tarry stools; red or dark-brown urine; spitting up blood or brown material that looks like coffee grounds; red spots on the skin; unusual bruising or bleeding from the eye, gums, or nose  signs and symptoms of liver injury like dark yellow or brown urine; general ill feeling or flu-like symptoms; light-colored stools; loss of appetite; nausea; right upper belly pain; unusually weak or tired; yellowing of the eyes or skin  signs and  symptoms of stroke like changes in vision; confusion; trouble speaking or understanding; severe headaches; sudden numbness or weakness of the face, arm, or leg; trouble walking; dizziness; loss of balance or coordination Side effects that usually do not require medical attention (report to your doctor or health care professional if they continue or are bothersome):  constipation  diarrhea  gas This list may not describe all possible side effects. Call your doctor for medical advice about side effects. You may report side effects to FDA at 1-800-FDA-1088. Where should I keep my medicine? Keep out of the reach of children. Store at room temperature between 15 and 30 degrees C (59 and 86 degrees F). Throw away any unused medicine after the expiration date. NOTE: This sheet is a summary. It may not cover all possible information. If you have questions about this medicine, talk to your doctor, pharmacist, or health care provider.  2020 Elsevier/Gold Standard (2018-09-27 11:19:03)   You may take the muscle relaxer, try at night first to see how you react to this medication.  Do not drive or operate machinery while taking the muscle relaxer.   Please contact the office should your symptoms increase.   We will  send in Rx for Phenteramine 37.5, take this first thing in the morning 33min before your first meal.  Continue to decrease sugary drinks, if you are drinking three giner-ales a week decrease to two.  Continue to decrease sweets or desserts.  You may have one sweet or dessert once a week.  This second month we are going to work on cutting sweet and desserts. This includes candy, doughnuts, cake, pie, cookies etc. You may have ONE single treat once a week.  Those successful with this pick one day a week they may have their one treat.  For example if you tend to go out for ice cream with the family on Saturdays, make Saturday your day.  Consistency with your day of the week gives you  something to look forward to.  Continue to refrain from sugary drinks and keep monitoring your sugar intake.  Stay healthy and keep up the good work!   Follow up in one month for weight check.  Keep up the good work you are down 13lbs!  Great work!!!

## 2019-05-03 ENCOUNTER — Other Ambulatory Visit: Payer: Self-pay

## 2019-05-03 DIAGNOSIS — Z20822 Contact with and (suspected) exposure to covid-19: Secondary | ICD-10-CM

## 2019-05-05 LAB — NOVEL CORONAVIRUS, NAA: SARS-CoV-2, NAA: NOT DETECTED

## 2019-05-21 ENCOUNTER — Other Ambulatory Visit: Payer: Self-pay

## 2019-05-21 DIAGNOSIS — I10 Essential (primary) hypertension: Secondary | ICD-10-CM

## 2019-05-21 MED ORDER — ROSUVASTATIN CALCIUM 5 MG PO TABS: 5.0000 mg | ORAL_TABLET | Freq: Every day | ORAL | 0 refills | Status: DC

## 2019-05-21 MED ORDER — HYDROCHLOROTHIAZIDE 12.5 MG PO CAPS: 12.5000 mg | ORAL_CAPSULE | Freq: Every day | ORAL | 0 refills | Status: DC

## 2019-05-21 MED ORDER — BUPROPION HCL ER (XL) 150 MG PO TB24: 150.0000 mg | ORAL_TABLET | Freq: Every day | ORAL | 0 refills | Status: DC

## 2019-06-01 ENCOUNTER — Other Ambulatory Visit: Payer: Self-pay | Admitting: Adult Health Nurse Practitioner

## 2019-06-01 DIAGNOSIS — I10 Essential (primary) hypertension: Secondary | ICD-10-CM

## 2019-06-01 DIAGNOSIS — Z789 Other specified health status: Secondary | ICD-10-CM

## 2019-06-01 MED ORDER — BUPROPION HCL ER (XL) 150 MG PO TB24: 150.0000 mg | ORAL_TABLET | Freq: Every day | ORAL | 1 refills | Status: DC

## 2019-06-01 MED ORDER — HYDROCHLOROTHIAZIDE 12.5 MG PO CAPS: 12.5000 mg | ORAL_CAPSULE | Freq: Every day | ORAL | 1 refills | Status: DC

## 2019-06-04 NOTE — Progress Notes (Signed)
Weight Managment   43 y.o.female presents for a follow up after being on phentermine for weight loss.  Kendra Stein reports Kendra Stein is doing well over all and feels great.  Reports her coworkers have noticed her weight loss and asking her what changes Kendra Stein had made.  Kendra Stein reports Kendra Stein has not had any further flare ups with her knees or with her neck pain. While on the medication they have lost 4 lbs since last visit which was 4 weeks ago.  Overall Kendra Stein has lost 20lbs from starting weight of 239lbs. Kendra Stein has been making small changes and substitutions in her diet and reports this has been working well for her.  Kendra Stein has also been increasing her activity by means of staying busy in and out of the home.  Kendra Stein reports Kendra Stein is able to walk more at work. They deny palpitations, anxiety, trouble sleeping, elevated BP.   Kendra Stein reports hernia surgery around 2015.  Kendra Stein has noted a hard spot in her abdomen around her navel.  Reports last week it was sore and tender where her pants put pressure on the area.  Reports this has decreased.  Kendra Stein has not noticed a bulge in her abdomin. Kendra Stein denies any redness, lesions, severe abdominal pains, change in bowel hbbits, diarrhea or constipation.  BP Readings from Last 3 Encounters:  06/05/19 120/76  05/01/19 126/84  11/29/18 110/76      BMI is Body mass index is 40.71 kg/m., Kendra Stein is working on diet and exercise. Wt Readings from Last 3 Encounters:  06/05/19 219 lb (99.3 kg)  05/01/19 222 lb (100.7 kg)  11/29/18 235 lb (106.6 kg)    Typical breakfast: Two pieces of bacon, egg and full glass of water Typical lunch: Varies but Kendra Stein has been bringing lunch from home and left overs from dinners.  Kendra Stein reports eating out some but has improved her choices to healthier, salads instead of fried foods. Typical dinner: Chicken or ground [place].  Kendra Stein has vegetables typically  Reports Kendra Stein has cut out sodas dramatically and still will drink half of a 8oz Soda once a week.  Stopped snacking at night  and has been drinking more water.    Activity:  Kendra Stein has been walking and moving more at work and keeping herself busy at home with projects around the house.  Her daughter is pregnant and this will be her first grandchild.  Kendra Stein reports Kendra Stein is helping her daughter to prepare for baby's arrival and really looking forward to this.   Medications:   Current Outpatient Medications (Cardiovascular):  .  hydrochlorothiazide (MICROZIDE) 12.5 MG capsule, Take 1 capsule (12.5 mg total) by mouth daily. .  rosuvastatin (CRESTOR) 5 MG tablet, Take 1 tablet (5 mg total) by mouth daily.   Current Outpatient Medications (Analgesics):  .  etodolac (LODINE) 400 MG tablet, Take 1 tablet (400 mg total) by mouth 2 (two) times daily as needed. Marland Kitchen  ibuprofen (ADVIL,MOTRIN) 200 MG tablet, Take 600 mg by mouth every 6 (six) hours as needed for mild pain. .  meloxicam (MOBIC) 15 MG tablet, Take 1 tablet (15 mg total) by mouth daily. .  naproxen (NAPROSYN) 500 MG tablet, Take 1 tablet (500 mg total) by mouth as needed. .  SUMAtriptan (IMITREX) 100 MG tablet, Take 1 tablet (100 mg total) by mouth once as needed for migraine. May repeat in 2 hours if headache persists or recurs.   Current Outpatient Medications (Other):  Marland Kitchen  buPROPion (WELLBUTRIN XL) 150 MG 24 hr tablet, Take  1 tablet (150 mg total) by mouth daily. .  Cholecalciferol 1.25 MG (50000 UT) capsule, Take one tablet by mouth three days a week for twelve weeks. .  methocarbamol (ROBAXIN-750) 750 MG tablet, Take one tablet by mouth at bedtime, as needed.  May take one tablet during the day as needed for muscles spasms. .  Multiple Vitamins-Minerals (MULTIVITAMIN GUMMIES ADULT PO), Take by mouth. DAILY .  phentermine (ADIPEX-P) 37.5 MG tablet, TAKE ONE TABLET BY MOUTH DAILY BEFORE BREAKFAST  ROS: All negative except for above  Physical exam: Vitals:   06/05/19 0950  BP: 120/76  Pulse: 76  Temp: 97.6 F (36.4 C)  SpO2: 99%   Physical  Exam Constitutional:      Appearance: Normal appearance.  HENT:     Head: Normocephalic.  Neck:     Musculoskeletal: Normal range of motion.  Cardiovascular:     Rate and Rhythm: Normal rate and regular rhythm.  Abdominal:     General: Bowel sounds are normal.     Palpations: Abdomen is soft.     Comments: Posterior, 2'oclock navel, firm moveable mass noted, non-tender, scar tissue?  Neurological:     Mental Status: Kendra Stein is alert and oriented to person, place, and time.  Psychiatric:        Mood and Affect: Mood normal.        Behavior: Behavior normal.     Assessment: Devaeh was seen today for follow-up.  Diagnoses and all orders for this visit:  Encounter for weight management  Morbid obesity with BMI of 40.0-44.9, adult ([place])  Discussed dietary and exercise modifications Start Phentermine 37.5mg  daily, 58min prior to first meal Increase water intake, 80oz or more a day. Increase meal planning and continue small substitutions for healthier options..   Plan: Kendra Stein will work on meal planning, intentional eating, and increasing water.  Kendra Stein has been instructed to work up to a goal of 150 minutes of combined cardio and strengthening exercise per week for weight loss and overall health benefits. We discussed the following Behavioral Modification Strategies today: increasing lean protein intake, decreasing simple carbohydrates, increasing vegetables, increase H20 intake, decrease eating out, no skipping meals, work on meal planning and easy cooking plans, keeping healthy foods in the home, and planning for success.   Kendra Stein has agreed to follow-up with our clinic in 4 weeks. Kendra Stein was informed of the importance of frequent follow-up visits to maximize her success with intensive lifestyle modifications for her multiple health conditions.  Medication: phentermine 37.5mg , no refill needed today.  Future Appointments  Date Time Provider [place]  10/22/2019 10:00 AM Vicie Mutters, PA-C [facility] None

## 2019-06-05 ENCOUNTER — Encounter: Payer: Self-pay | Admitting: Adult Health Nurse Practitioner

## 2019-06-05 ENCOUNTER — Ambulatory Visit: Payer: BC Managed Care – PPO | Admitting: Adult Health Nurse Practitioner

## 2019-06-05 ENCOUNTER — Other Ambulatory Visit: Payer: Self-pay

## 2019-06-05 VITALS — BP 120/76 | HR 76 | Temp 97.6°F | Wt 219.0 lb

## 2019-06-05 DIAGNOSIS — Z7689 Persons encountering health services in other specified circumstances: Secondary | ICD-10-CM

## 2019-06-05 DIAGNOSIS — Z6841 Body Mass Index (BMI) 40.0 and over, adult: Secondary | ICD-10-CM

## 2019-06-05 NOTE — Patient Instructions (Signed)
Keep up the good work making small changes!   We will follow up in one month.        When it comes to diets, agreement about the perfect plan isn't easy to find, even among the experts. Experts at the [employer] developed an idea known as the Healthy Eating Plate. Just imagine a plate divided into logical, healthy portions.  The emphasis is on diet quality:  Load up on vegetables and fruits - one-half of your plate: Aim for color and variety, and remember that potatoes don't count.  Go for whole grains - one-quarter of your plate: Whole wheat, barley, wheat berries, quinoa, oats, brown rice, and foods made with them. If you want pasta, go with whole wheat pasta.  Protein power - one-quarter of your plate: Fish, chicken, beans, and nuts are all healthy, versatile protein sources. Limit red meat.  The diet, however, does go beyond the plate, offering a few other suggestions.  Use healthy plant oils, such as olive, canola, soy, corn, sunflower and peanut. Check the labels, and avoid partially hydrogenated oil, which have unhealthy trans fats.  If you're thirsty, drink water. Coffee and tea are good in moderation, but skip sugary drinks and limit milk and dairy products to one or two daily servings.  The type of carbohydrate in the diet is more important than the amount. Some sources of carbohydrates, such as vegetables, fruits, whole grains, and beans-are healthier than others.  Finally, stay active.

## 2019-06-12 DIAGNOSIS — Z6841 Body Mass Index (BMI) 40.0 and over, adult: Secondary | ICD-10-CM

## 2019-06-12 MED ORDER — PHENTERMINE HCL 37.5 MG PO TABS: ORAL_TABLET | ORAL | 2 refills | Status: DC

## 2019-06-20 NOTE — Progress Notes (Signed)
Assessment and Plan:  Kendra Stein was seen today for acute visit and vaginal discharge.  Discussed possible etiologies related to symptoms and assessment.  Treatment/interventions pending lab results.  Diagnoses and all orders for this visit:  Dysuria -     Urinalysis w microscopic + reflex cultur  Vaginal odor Vaginal discharge -     Urinalysis w microscopic + reflex cultur -     WET PREP BY MOLECULAR PROBE   Call or return with new or worsening symptoms as discussed in appointment.  May contact office via phone [phone] or [place].    Further disposition pending results of labs. Discussed med's effects and SE's.   Over 30 minutes of interview, exam, counseling, chart review, and critical decision making was performed.   Future Appointments  Date Time Provider [place]  07/10/2019  8:45 AM Garnet Sierras, NP [facility] None  10/22/2019 10:00 AM Vicie Mutters, PA-C [facility] None    ------------------------------------------------------------------------------------------------------------------   HPI 43 y.o.female presents for evaluation of vaginal discharge that began about a week ago.  She mentions she took a muscle relaxer one evening and appeared afterwards.  This was the only variation of her routine medications.  When she wipes the discharge is clear and she can feel it leaking at times.  Also reports a different odor than normal but not overpowering.  Denies any abdominal pains, cramping, abnormal vaginal bleeding, itching or lesions.  She is monogamous and denies any new sexual partners or concern of STI exposure.  Reports she has had urinary infection in the past but this seem different and denies hematuria.  Denies douching, no new personal care or laundry products.  Does endorse changing from dove bar soap to liquid.    Past Medical History:  Diagnosis Date  . Abnormal Pap smear   . Cervical polyp   . Depression   . Headache(784.0)   . Migraines    . Morbid obesity ([place])   . Prediabetes   . Toxemia in pregnancy   . Toxemia in pregnancy   . Umbilical hernia    SOME ABDOMINAL DISCOMFORT  . UTI (urinary tract infection)    ON MEDICATION AS OF 6/10 FOR UTI - HX OF FREQUENT UTI'S  . Vitamin D deficiency      No Known Allergies  Current Outpatient Medications on File Prior to Visit  Medication Sig  . buPROPion (WELLBUTRIN XL) 150 MG 24 hr tablet Take 1 tablet (150 mg total) by mouth daily.  . Cholecalciferol 1.25 MG (50000 UT) capsule Take one tablet by mouth three days a week for twelve weeks.  Marland Kitchen etodolac (LODINE) 400 MG tablet Take 1 tablet (400 mg total) by mouth 2 (two) times daily as needed.  . hydrochlorothiazide (MICROZIDE) 12.5 MG capsule Take 1 capsule (12.5 mg total) by mouth daily.  Marland Kitchen ibuprofen (ADVIL,MOTRIN) 200 MG tablet Take 600 mg by mouth every 6 (six) hours as needed for mild pain.  . meloxicam (MOBIC) 15 MG tablet Take 1 tablet (15 mg total) by mouth daily.  . methocarbamol (ROBAXIN-750) 750 MG tablet Take one tablet by mouth at bedtime, as needed.  May take one tablet during the day as needed for muscles spasms.  . Multiple Vitamins-Minerals (MULTIVITAMIN GUMMIES ADULT PO) Take by mouth. DAILY  . naproxen (NAPROSYN) 500 MG tablet Take 1 tablet (500 mg total) by mouth as needed.  . phentermine (ADIPEX-P) 37.5 MG tablet TAKE ONE TABLET BY MOUTH DAILY BEFORE BREAKFAST  . rosuvastatin (CRESTOR) 5 MG tablet Take 1 tablet (  5 mg total) by mouth daily.  . SUMAtriptan (IMITREX) 100 MG tablet Take 1 tablet (100 mg total) by mouth once as needed for migraine. May repeat in 2 hours if headache persists or recurs.   No current facility-administered medications on file prior to visit.    ROS: all negative except above.   Physical Exam:  BP 128/84   Pulse 66   Temp 98.1 F (36.7 C)   Wt 219 lb 12.8 oz (99.7 kg)   SpO2 97%   BMI 40.86 kg/m   General Appearance: Well nourished, in no apparent distress. Eyes: PERRLA,  EOMs, conjunctiva no swelling or erythema ENT/Mouth: Wearing a mask.  Hearing normal.  Respiratory: Respiratory effort normal, BS equal bilaterally without rales, rhonchi, wheezing or stridor.  Cardio: RRR with no MRGs. Brisk peripheral pulses without edema.  Abdomen: Soft, + BS.  Non tender, no guarding, rebound, hernias, masses. Pelvic: External genitalia:  no lesions  Urethra:  normal appearing urethra with no masses, tenderness or lesions, Bartholin's and Skene's: normal    Vagina: normal appearing anatomy, no discharge noted, no lesions, rashes or erythema noted.          Vaginal swab obtained to r/u BV, yeast/ trichamonas Internal deferred. Lymphatics: Non tender without lymphadenopathy.  Musculoskeletal: Full ROM, 5/5 strength, normal gait.  Skin: Warm, dry without rashes, lesions, ecchymosis.  Neuro: Cranial nerves intact. Normal muscle tone, no cerebellar symptoms. Sensation intact.  Psych: Awake and oriented X 3, normal affect, Insight and Judgment appropriate.     Garnet Sierras, NP 9:50 AM [facility]

## 2019-06-21 ENCOUNTER — Encounter: Payer: Self-pay | Admitting: Adult Health Nurse Practitioner

## 2019-06-21 ENCOUNTER — Other Ambulatory Visit: Payer: Self-pay

## 2019-06-21 ENCOUNTER — Ambulatory Visit: Payer: BC Managed Care – PPO | Admitting: Adult Health Nurse Practitioner

## 2019-06-21 VITALS — BP 128/84 | HR 66 | Temp 98.1°F | Wt 219.8 lb

## 2019-06-21 DIAGNOSIS — R300 Dysuria: Secondary | ICD-10-CM

## 2019-06-21 DIAGNOSIS — N898 Other specified noninflammatory disorders of vagina: Secondary | ICD-10-CM | POA: Diagnosis not present

## 2019-06-22 ENCOUNTER — Other Ambulatory Visit: Payer: Self-pay | Admitting: Adult Health Nurse Practitioner

## 2019-06-22 DIAGNOSIS — B373 Candidiasis of vulva and vagina: Secondary | ICD-10-CM

## 2019-06-22 DIAGNOSIS — N760 Acute vaginitis: Secondary | ICD-10-CM

## 2019-06-22 DIAGNOSIS — B3731 Acute candidiasis of vulva and vagina: Secondary | ICD-10-CM

## 2019-06-22 DIAGNOSIS — B9689 Other specified bacterial agents as the cause of diseases classified elsewhere: Secondary | ICD-10-CM

## 2019-06-22 LAB — WET PREP BY MOLECULAR PROBE
Candida species: NOT DETECTED
MICRO NUMBER:: 1186135
SPECIMEN QUALITY:: ADEQUATE
Trichomonas vaginosis: NOT DETECTED

## 2019-06-22 LAB — URINALYSIS W MICROSCOPIC + REFLEX CULTURE

## 2019-06-22 LAB — NO CULTURE INDICATED

## 2019-06-22 MED ORDER — FLUCONAZOLE 150 MG PO TABS: ORAL_TABLET | ORAL | 0 refills | Status: DC

## 2019-06-22 MED ORDER — METRONIDAZOLE 500 MG PO TABS: ORAL_TABLET | ORAL | 0 refills | Status: DC

## 2019-07-09 NOTE — Progress Notes (Signed)
Weight Managment   43 y.o.female presents for a follow up after being on phentermine for weight loss.    She reports she continues to do well on the medication and feels great.  She continues to improve her diet making small changes to cut calories.  She has increased her activity by means of stretching, dancing, staying busy with projects.  She also walks in her neighborhood, weather permitting. She reports she has not had any further flare ups with her knees since she has lost weight.  She had some intermittent neck pain that resolved but has had a few days where the discomfort returns.  She has PRN Meloxicam to use and this has been successful in managing her neck pain.   While on the medication they have lost 1 lbs since last visit which was 4 weeks ago.  Overall she has lost 21lbs from starting weight of 239lbs.   She denies palpitations, anxiety, trouble sleeping, elevated BP.   Last OV she had concerns of increase vaginal discharge, clear and odor.  Swabbed and positive for BV.  Patient was treat with resolution of symptoms.  Today she reports some dysuria and initiation of urinating.  Also reports some incomplete emptying with discomfort.  Reports her urine has a strong odor.  Denies any vaginal discharge, hematuria, abdominal pains or vaginal odor.   BP Readings from Last 3 Encounters:  07/10/19 124/86  06/21/19 128/84  06/05/19 120/76      BMI is Body mass index is 40.64 kg/m., she is working on diet and exercise. Wt Readings from Last 3 Encounters:  07/10/19 218 lb 9.6 oz (99.2 kg)  06/21/19 219 lb 12.8 oz (99.7 kg)  06/05/19 219 lb (99.3 kg)   She has improved on eating first thing in the morning.  She continues to eat similar foods for consistency.  Typical breakfast: Two pieces of bacon, egg and full glass of water Typical lunch: Varies but she has been bringing lunch from home and left overs from dinner night before.  She reports eating out some but has improved her  choices to healthier, salads instead of fried foods. Typical dinner: Chicken or ground [place].  She has vegetables raw or cooked.  She does not eat after 7pm at night and this has been working well also.  Reports she has cut out sodas dramatically and still will drink half of a 8oz Soda once a week.  Stopped snacking at night and has been drinking more water.    Activity:  She has been walking and moving more at work and keeping herself busy at home with projects around the house.  Her daughter is pregnant and this will be her first grandchild.  She reports she is helping her daughter to prepare for baby's arrival and really looking forward to this.   Medications:   Current Outpatient Medications (Cardiovascular):  .  hydrochlorothiazide (MICROZIDE) 12.5 MG capsule, Take 1 capsule (12.5 mg total) by mouth daily. .  rosuvastatin (CRESTOR) 5 MG tablet, Take 1 tablet (5 mg total) by mouth daily.   Current Outpatient Medications (Analgesics):  .  etodolac (LODINE) 400 MG tablet, Take 1 tablet (400 mg total) by mouth 2 (two) times daily as needed. Marland Kitchen  ibuprofen (ADVIL,MOTRIN) 200 MG tablet, Take 600 mg by mouth every 6 (six) hours as needed for mild pain. .  meloxicam (MOBIC) 15 MG tablet, Take 1 tablet (15 mg total) by mouth daily. .  naproxen (NAPROSYN) 500 MG tablet, Take 1 tablet (500  mg total) by mouth as needed. .  SUMAtriptan (IMITREX) 100 MG tablet, Take 1 tablet (100 mg total) by mouth once as needed for migraine. May repeat in 2 hours if headache persists or recurs.   Current Outpatient Medications (Other):  Marland Kitchen  buPROPion (WELLBUTRIN XL) 150 MG 24 hr tablet, Take 1 tablet (150 mg total) by mouth daily. .  methocarbamol (ROBAXIN-750) 750 MG tablet, Take one tablet by mouth at bedtime, as needed.  May take one tablet during the day as needed for muscles spasms. .  Multiple Vitamins-Minerals (MULTIVITAMIN GUMMIES ADULT PO), Take by mouth. DAILY .  phentermine (ADIPEX-P) 37.5 MG  tablet, TAKE ONE TABLET BY MOUTH DAILY BEFORE BREAKFAST .  Cholecalciferol 1.25 MG (50000 UT) capsule, Take one tablet by mouth three days a week for twelve weeks. .  fluconazole (DIFLUCAN) 150 MG tablet, Take one tablet by mouth at onset of yeast symptoms.  Take second tablet on day three. .  nitrofurantoin, macrocrystal-monohydrate, (MACROBID) 100 MG capsule, Take 1 capsule (100 mg total) by mouth 2 (two) times daily for 7 days.  ROS: All negative except for above  Physical exam: Vitals:   07/10/19 0831  BP: 124/86  Pulse: 86  Temp: 97.6 F (36.4 C)  SpO2: 98%   Physical Exam Constitutional:      Appearance: Normal appearance.  HENT:     Head: Normocephalic.  Cardiovascular:     Rate and Rhythm: Normal rate and regular rhythm.  Abdominal:     General: Bowel sounds are normal.     Palpations: Abdomen is soft.  Musculoskeletal:     Cervical back: Normal range of motion.  Neurological:     Mental Status: She is alert and oriented to person, place, and time.  Psychiatric:        Mood and Affect: Mood normal.        Behavior: Behavior normal.     Assessment: Kendra Stein was seen today for follow-up.  Diagnoses and all orders for this visit:  Encounter for weight management  Morbid obesity with BMI of 40.0-44.9, adult ([place])  Discussed dietary and exercise modifications Start Phentermine 37.5mg  daily, 15min prior to first meal Increase water intake, 80oz or more a day. Increase meal planning and continue small substitutions for healthier options.   Plan: She will work on meal planning, intentional eating, and increasing water.  She has been instructed to work up to a goal of 150 minutes of combined cardio and strengthening exercise per week for weight loss and overall health benefits. We discussed the following Behavioral Modification Strategies today: increasing lean protein intake, decreasing simple carbohydrates, increasing vegetables, increase H20 intake, decrease  eating out, no skipping meals, work on meal planning and easy cooking plans, keeping healthy foods in the home, and planning for success.   She has agreed to follow-up with our clinic in 4 weeks. She was informed of the importance of frequent follow-up visits to maximize her success with intensive lifestyle modifications for her multiple health conditions.  Medication: phentermine 37.5mg , no refill needed today.  Kimore was seen today for follow-up and other.  Diagnoses and all orders for this visit:  Dysuria -     Urinalysis w microscopic + reflex cultur Discussed increase in water intake Discussed hygiene No new hygiene products, does not douche.  Vitamin D deficiency -     Cholecalciferol 1.25 MG (50000 UT) capsule; Take one tablet by mouth three days a week for twelve weeks.  Migraine without status migrainosus, not intractable,  unspecified migraine type Doing well, her prescription expired.  Will send refill for PRN Monitor frequency and duration -     SUMAtriptan (IMITREX) 100 MG tablet; Take 1 tablet (100 mg total) by mouth once as needed for migraine. May repeat in 2 hours if headache persists or recurs.  Other orders -     Urine Culture -     REFLEXIVE URINE CULTURE    Future Appointments  Date Time Provider [place]  08/16/2019 10:00 AM Garnet Sierras, NP [facility] None  10/22/2019 10:00 AM Vicie Mutters, PA-C [facility] None   Follow up in one month  Garnet Sierras, NP [facility] 07/10/2019  5:00 PM

## 2019-07-10 ENCOUNTER — Other Ambulatory Visit: Payer: Self-pay

## 2019-07-10 ENCOUNTER — Encounter: Payer: Self-pay | Admitting: Adult Health Nurse Practitioner

## 2019-07-10 ENCOUNTER — Ambulatory Visit: Payer: BC Managed Care – PPO | Admitting: Adult Health Nurse Practitioner

## 2019-07-10 VITALS — BP 124/86 | HR 86 | Temp 97.6°F | Wt 218.6 lb

## 2019-07-10 DIAGNOSIS — R300 Dysuria: Secondary | ICD-10-CM

## 2019-07-10 DIAGNOSIS — Z7689 Persons encountering health services in other specified circumstances: Secondary | ICD-10-CM | POA: Diagnosis not present

## 2019-07-10 DIAGNOSIS — G43909 Migraine, unspecified, not intractable, without status migrainosus: Secondary | ICD-10-CM | POA: Diagnosis not present

## 2019-07-10 DIAGNOSIS — E559 Vitamin D deficiency, unspecified: Secondary | ICD-10-CM

## 2019-07-10 DIAGNOSIS — Z6841 Body Mass Index (BMI) 40.0 and over, adult: Secondary | ICD-10-CM

## 2019-07-10 MED ORDER — SUMATRIPTAN SUCCINATE 100 MG PO TABS: 100.0000 mg | ORAL_TABLET | Freq: Once | ORAL | 2 refills | Status: DC | PRN

## 2019-07-10 MED ORDER — CHOLECALCIFEROL 1.25 MG (50000 UT) PO CAPS: ORAL_CAPSULE | ORAL | 0 refills | Status: DC

## 2019-07-10 NOTE — Patient Instructions (Signed)
We will check your urine and respond via MyChart  We have sent in another round of Vitamin D to your pharmacy.  Take one tablet three days a week.  We have also sent in a refill on your phentermine 37.5mg .  Continue to take this in the mornings before your first meal.  KEEP UP THE GOOD WORK!  You have continued to lose weight even other the holidays!  Continue to tweak your current regiment to keep up positive results.  We talked about some additional stretches to help with neck/arm pain.  Cervical radiculapathy    How To Overcome Cervical Pinched Nerve And Radiculopathy. "No Worries"   Brad & Mikki Santee are physical therapists and have many great videos and suggestions.  [email or web address]   We will follow up in one month.  Keep moving and let's shoot for 5 more pounds!  Have a safe holiday!

## 2019-07-12 ENCOUNTER — Other Ambulatory Visit: Payer: Self-pay | Admitting: Adult Health Nurse Practitioner

## 2019-07-12 DIAGNOSIS — B9620 Unspecified Escherichia coli [E. coli] as the cause of diseases classified elsewhere: Secondary | ICD-10-CM

## 2019-07-12 DIAGNOSIS — B373 Candidiasis of vulva and vagina: Secondary | ICD-10-CM

## 2019-07-12 DIAGNOSIS — B3731 Acute candidiasis of vulva and vagina: Secondary | ICD-10-CM

## 2019-07-12 LAB — URINALYSIS W MICROSCOPIC + REFLEX CULTURE
Bacteria, UA: NONE SEEN /HPF
Bilirubin Urine: NEGATIVE
Glucose, UA: NEGATIVE
Hgb urine dipstick: NEGATIVE
Hyaline Cast: NONE SEEN /LPF
Ketones, ur: NEGATIVE
Nitrites, Initial: NEGATIVE
Protein, ur: NEGATIVE
Specific Gravity, Urine: 1.014 (ref 1.001–1.03)
Squamous Epithelial / HPF: NONE SEEN /HPF (ref <=5)
pH: <=5 (ref 5.0–8.0)

## 2019-07-12 LAB — URINE CULTURE
MICRO NUMBER:: 1238284
SPECIMEN QUALITY:: ADEQUATE

## 2019-07-12 LAB — CULTURE INDICATED

## 2019-07-12 MED ORDER — FLUCONAZOLE 150 MG PO TABS: ORAL_TABLET | ORAL | 0 refills | Status: DC

## 2019-07-12 MED ORDER — NITROFURANTOIN MONOHYD MACRO 100 MG PO CAPS: 100.0000 mg | ORAL_CAPSULE | Freq: Two times a day (BID) | ORAL | 0 refills | Status: AC

## 2019-08-08 ENCOUNTER — Ambulatory Visit: Payer: BC Managed Care – PPO | Attending: Internal Medicine

## 2019-08-08 DIAGNOSIS — Z20822 Contact with and (suspected) exposure to covid-19: Secondary | ICD-10-CM

## 2019-08-09 LAB — NOVEL CORONAVIRUS, NAA: SARS-CoV-2, NAA: NOT DETECTED

## 2019-08-16 ENCOUNTER — Ambulatory Visit: Payer: BC Managed Care – PPO | Admitting: Adult Health Nurse Practitioner

## 2019-08-22 NOTE — Progress Notes (Signed)
Weight Managment   44 y.o.female presents for a follow up on weight loss while taking phemtermine.     Today she reports that she continues to tolerate phentermine.  She takes this first thing in the morning.  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  She also reports she participates in exercise at home by dancing.  When the weather is nice she walks in her neighborhood.   While on the medication they have lost 1 lbs since last visit which was 4 weeks ago.  Overall she has lost 20lbs from starting weight of 239lbs.   She denies palpitations, anxiety, trouble sleeping, elevated BP.   OV on 06/1019 she had concerns of increase vaginal discharge, clear and odor.  Swabbed and positive for BV.  Patient was treat with complete resolution of symptoms.  OV 07/10/19 she was having dysuria and found to have UTI.  She completed antibiotics without complications and resolution of symptoms.  She had SARS-Cov-2 test on 08/08/19 which was negative.  She is asymptomatic today.   BP Readings from Last 3 Encounters:  07/10/19 124/86  06/21/19 128/84  06/05/19 120/76      BMI is There is no height or weight on file to calculate BMI., she is working on diet and exercise. Wt Readings from Last 3 Encounters:  07/10/19 218 lb 9.6 oz (99.2 kg)  06/21/19 219 lb 12.8 oz (99.7 kg)  06/05/19 219 lb (99.3 kg)   She continue to improves on eating breakfast in the morning and not waiting 4-5 hours.  Reports this has helped.  She continues to eat similar foods for consistency.  She has been adding in more vegetables into her daily diet.  Typical breakfast: Two pieces of bacon, egg and full glass of water Typical lunch: Varies but she has been bringing lunch from home and left overs from dinner night before.  She reports eating out some but has improved her  choices to healthier, salads instead of fried foods. Typical dinner: Chicken or ground [place].  She has vegetables raw or cooked.  She has been trying to do more meal planning.  She reports that this is working well.   She continues to avoid eating after 7pm at night.  Reports she has cut out sodas dramatically and still will drink half of a 8oz Soda once a week.   She reports migraines that are occurring once every other week.  She has been taking extra strength tylenol that seems to help.  Last OV sent in refill for abortive PRN medication.  She was unable to obtain the medication.  She continued to have a migraine once a week that would interrupt her daily routine.  She denies any particular triggers although she mentions she has had some increased stress in her life that she has been dealing with.  She reports she has family and friends that she is in close contact with.  Medications:   Current Outpatient Medications (Cardiovascular):  .  hydrochlorothiazide (MICROZIDE) 12.5 MG capsule, Take 1 capsule (12.5 mg total) by mouth daily. .  rosuvastatin (CRESTOR) 5 MG tablet, Take 1 tablet (5 mg total) by mouth daily.   Current Outpatient Medications (Analgesics):  .  etodolac (LODINE) 400 MG tablet, Take 1 tablet (400 mg total) by mouth 2 (two) times daily as  needed. Marland Kitchen  ibuprofen (ADVIL,MOTRIN) 200 MG tablet, Take 600 mg by mouth every 6 (six) hours as needed for mild pain. .  meloxicam (MOBIC) 15 MG tablet, Take 1 tablet (15 mg total) by mouth daily. .  naproxen (NAPROSYN) 500 MG tablet, Take 1 tablet (500 mg total) by mouth as needed. .  SUMAtriptan (IMITREX) 100 MG tablet, Take 1 tablet (100 mg total) by mouth once as needed for migraine. May repeat in 2 hours if headache persists or recurs.   Current Outpatient Medications (Other):  Marland Kitchen  buPROPion (WELLBUTRIN XL) 150 MG 24 hr tablet, Take 1 tablet (150 mg total) by mouth daily. .  Cholecalciferol 1.25 MG (50000 UT) capsule, Take one  tablet by mouth three days a week for twelve weeks. .  fluconazole (DIFLUCAN) 150 MG tablet, Take one tablet by mouth at onset of yeast symptoms.  Take second tablet on day three. .  methocarbamol (ROBAXIN-750) 750 MG tablet, Take one tablet by mouth at bedtime, as needed.  May take one tablet during the day as needed for muscles spasms. .  Multiple Vitamins-Minerals (MULTIVITAMIN GUMMIES ADULT PO), Take by mouth. DAILY .  phentermine (ADIPEX-P) 37.5 MG tablet, TAKE ONE TABLET BY MOUTH DAILY BEFORE BREAKFAST  ROS: All negative except for above  Physical exam: There were no vitals filed for this visit. Physical Exam Constitutional:      Appearance: Normal appearance.  HENT:     Head: Normocephalic.  Cardiovascular:     Rate and Rhythm: Normal rate and regular rhythm.  Abdominal:     General: Bowel sounds are normal.     Palpations: Abdomen is soft.  Musculoskeletal:     Cervical back: Normal range of motion.  Neurological:     Mental Status: She is alert and oriented to person, place, and time.  Psychiatric:        Mood and Affect: Mood normal.        Behavior: Behavior normal.     Assessment: Iyanla was seen today for follow-up.  Diagnoses and all orders for this visit:  Encounter for weight management  Morbid obesity with BMI of 40.0-44.9, adult ([place])  Discussed dietary and exercise modifications Continue Phentermine 37.5mg  daily, 63min prior to first meal Increase water intake, 80oz or more a day. Increase meal planning and continue small substitutions for healthier options.   Plan: She will work on meal planning, intentional eating, and increasing water.  She has been instructed to work up to a goal of 150 minutes of combined cardio and strengthening exercise per week for weight loss and overall health benefits.  We discussed the following Behavioral Modification Strategies today: increasing lean protein intake, decreasing simple carbohydrates, increasing  vegetables, increase H20 intake, decrease eating out, no skipping meals, work on meal planning and easy cooking plans, keeping healthy foods in the home, and meal planning for success.   She is wanting to take a break from taking the medicaton.  She reports she feels like she has hit a lull with the medication.  We discussed the importance o f close watch on her food intake.  Increase activity when avilable.  She has physical scheduled in one month.  Will follow up at this time.  No refill needed today.   Migraine without status migrainosus, not intractable, unspecified migraine type Doing well, her prescription expired.  Will send refill for PRN Monitor frequency and duration -     Maxalt10 MG tablet; Take 1 tablet (100 mg total) by mouth once  as needed for migraine. May repeat in 2 hours if headache persists or recurs.   Future Appointments  Date Time Provider [place]  08/23/2019  9:30 AM Garnet Sierras, NP [facility] None  10/22/2019 10:00 AM Vicie Mutters, PA-C [facility] None   Follow up in one month    Garnet Sierras, NP [facility] 08/23/2019  9:10 AM

## 2019-08-23 ENCOUNTER — Ambulatory Visit (INDEPENDENT_AMBULATORY_CARE_PROVIDER_SITE_OTHER): Payer: BC Managed Care – PPO | Admitting: Adult Health Nurse Practitioner

## 2019-08-23 ENCOUNTER — Encounter: Payer: Self-pay | Admitting: Adult Health Nurse Practitioner

## 2019-08-23 ENCOUNTER — Other Ambulatory Visit: Payer: Self-pay

## 2019-08-23 VITALS — BP 128/72 | HR 64 | Temp 97.7°F | Wt 219.6 lb

## 2019-08-23 DIAGNOSIS — G43909 Migraine, unspecified, not intractable, without status migrainosus: Secondary | ICD-10-CM | POA: Diagnosis not present

## 2019-08-23 DIAGNOSIS — J301 Allergic rhinitis due to pollen: Secondary | ICD-10-CM

## 2019-08-23 DIAGNOSIS — Z6841 Body Mass Index (BMI) 40.0 and over, adult: Secondary | ICD-10-CM | POA: Diagnosis not present

## 2019-08-23 DIAGNOSIS — Z7689 Persons encountering health services in other specified circumstances: Secondary | ICD-10-CM

## 2019-08-23 MED ORDER — RIZATRIPTAN BENZOATE 10 MG PO TABS: 10.0000 mg | ORAL_TABLET | Freq: Once | ORAL | 2 refills | Status: AC | PRN

## 2019-08-23 MED ORDER — PHENTERMINE HCL 37.5 MG PO TABS: ORAL_TABLET | ORAL | 2 refills | Status: DC

## 2019-08-23 NOTE — Patient Instructions (Addendum)
  We are sending in [place] to your pharmacy for your migraine headaches.  Take one tablet at onset of symptoms.  You may take a second tablet two hours later is not resolved.   You can take a break from your phentermine.  Be extra mindful of your diet and activity.     [facility] Sinus Rinse / Neti Pot Use warm bottled or distilled water DO NOT use tap water! Use twice a day as needed This will help to sooth irritated sinuses and clear nasal congestion If using nasal sprays, do so after completing this.   Follow up in 2 month for your complete physical.

## 2019-09-05 ENCOUNTER — Other Ambulatory Visit: Payer: Self-pay | Admitting: Adult Health Nurse Practitioner

## 2019-09-05 DIAGNOSIS — E782 Mixed hyperlipidemia: Secondary | ICD-10-CM

## 2019-09-05 DIAGNOSIS — Z789 Other specified health status: Secondary | ICD-10-CM

## 2019-09-05 DIAGNOSIS — I10 Essential (primary) hypertension: Secondary | ICD-10-CM

## 2019-09-05 MED ORDER — BUPROPION HCL ER (XL) 150 MG PO TB24: 150.0000 mg | ORAL_TABLET | Freq: Every day | ORAL | 2 refills | Status: DC

## 2019-09-05 MED ORDER — HYDROCHLOROTHIAZIDE 12.5 MG PO CAPS: 12.5000 mg | ORAL_CAPSULE | Freq: Every day | ORAL | 2 refills | Status: DC

## 2019-09-05 MED ORDER — ROSUVASTATIN CALCIUM 5 MG PO TABS: 5.0000 mg | ORAL_TABLET | Freq: Every day | ORAL | 2 refills | Status: DC

## 2019-09-08 ENCOUNTER — Ambulatory Visit: Payer: BC Managed Care – PPO | Attending: Internal Medicine

## 2019-09-08 DIAGNOSIS — Z23 Encounter for immunization: Secondary | ICD-10-CM

## 2019-09-08 NOTE — Progress Notes (Signed)
   Covid-19 Vaccination Clinic  Name:  Kendra Stein    MRN: [id number] DOB: 25-Mar-1976  09/08/2019  [name] was observed post Covid-19 immunization for 15 minutes without incidence. She was provided with Vaccine Information Sheet and instruction to access the V-Safe system.   [name] was instructed to call 911 with any severe reactions post vaccine: Marland Kitchen Difficulty breathing  . Swelling of your face and throat  . A fast heartbeat  . A bad rash all over your body  . Dizziness and weakness    Immunizations Administered    Name Date Dose VIS Date Route   Pfizer COVID-19 Vaccine 09/08/2019  5:27 PM 0.3 mL 06/22/2019 Intramuscular   Manufacturer: [place]   Lot: [id number]   [employer]: [id number]

## 2019-09-18 ENCOUNTER — Other Ambulatory Visit: Payer: Self-pay | Admitting: Internal Medicine

## 2019-09-18 DIAGNOSIS — Z1231 Encounter for screening mammogram for malignant neoplasm of breast: Secondary | ICD-10-CM

## 2019-09-29 ENCOUNTER — Ambulatory Visit: Payer: BC Managed Care – PPO | Attending: Internal Medicine

## 2019-09-29 DIAGNOSIS — Z23 Encounter for immunization: Secondary | ICD-10-CM

## 2019-09-29 NOTE — Progress Notes (Signed)
   Covid-19 Vaccination Clinic  Name:  Kendra Stein    MRN: [id number] DOB: 02/10/1976  09/29/2019  [name] was observed post Covid-19 immunization for 15 minutes without incident. She was provided with Vaccine Information Sheet and instruction to access the V-Safe system.   [name] was instructed to call 911 with any severe reactions post vaccine: Marland Kitchen Difficulty breathing  . Swelling of face and throat  . A fast heartbeat  . A bad rash all over body  . Dizziness and weakness   Immunizations Administered    Name Date Dose VIS Date Route   Pfizer COVID-19 Vaccine 09/29/2019  9:28 AM 0.3 mL 06/22/2019 Intramuscular   Manufacturer: [place]   Lot: [id number]   [place]: [id number]

## 2019-10-22 ENCOUNTER — Encounter: Payer: BC Managed Care – PPO | Admitting: Physician Assistant

## 2019-10-23 ENCOUNTER — Encounter: Payer: BC Managed Care – PPO | Admitting: Adult Health Nurse Practitioner

## 2019-10-24 NOTE — Progress Notes (Signed)
Complete Physical  Assessment and Plan:  Encounter for general adult medical examination with abnormal findings Yearly  Essential hypertension Continue current medications: HCTZ 12.5mg  daily in am Monitor blood pressure at home; call if consistently over 130/80 Continue DASH diet.   Reminder to go to the ER if any CP, SOB, nausea, dizziness, severe HA, changes vision/speech, left arm numbness and tingling and jaw pain. -     CBC with Differential/Platelet -     COMPLETE METABOLIC PANEL WITH GFR -     TSH -     Urinalysis, Routine w reflex microscopic -     Microalbumin / creatinine urine ratio -     EKG 12-Lead   Mixed hyperlipidemia Continue medications: rosuvastatin 5mg  Discussed dietary and exercise modifications Low fat diet -     Lipid panel  Vitamin D deficiency Continue supplementation Taking Vitamin D 5,000 IU daily -     VITAMIN D 25 Hydroxy (Vit-D Deficiency, Fractures)  Abnormal glucose Discussed dietary and exercise modifications -     Hemoglobin A1c  Morbid obesity (HCC) Discussed dietary and exercise modifications - increase veggies, decrease carbs - long discussion about weight loss, diet, and exercis  Medication management Continued  Morbid obesity with BMI of 40.0-44.9, adult (HCC) BMI 41 Previous used    phentermine (ADIPEX-P) 37.5 MG tablet; Taking drug holiday Continue making dietary improvement - follow up 1 months for progress monitoring - increase veggies, decrease carbs - long discussion about weight loss, diet, and exercise   Abnormal cervical Papanicolaou smear, unspecified abnormal pap finding UTD  Gastroesophageal reflux disease, esophagitis presence not specified Doing well at this time Diet discussed Monitor for triggers Avoid food with high acid content Avoid excessive cafeine Increase water intake  Migraine without status migrainosus, not intractable, unspecified migraine type Controlled at this time As PRN  medications  Screening, anemia, deficiency, iron -     Iron,Total/Total Iron Binding Cap -     Vitamin B12 -     Ferritin  B12 deficiency Continue supplementation  Screening for cardiovascular condition -     EKG 12-Lead  Screening for blood or protein in urine -     Urinalysis w microscopic + reflex cultur  Screening for thyroid disorder -     TSH  Screening for diabetes mellitus -A1c  Depression screening PQ2  Screening, anemia, deficiency, iron -     Iron,Total/Total Iron Binding Cap  Discussed med's effects and SE's. Screening labs and tests as requested with regular follow-up as recommended. Over 40 minutes of interview, exam, counseling, chart review, and complex, high level critical decision making was performed this visit.   HPI  44 y.o. female  presents for a complete physical.  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  She does workout. She denies chest pain, shortness of breath, dizziness.  BMI is There is no height or weight on file to calculate BMI., she is working on diet and exercise. Wt Readings from Last 3 Encounters:  08/23/19 219 lb 9.6 oz (99.6 kg)  07/10/19 218 lb 9.6 oz (99.2 kg)  06/21/19 219 lb 12.8 oz (99.7 kg)    She is not on cholesterol medication and denies myalgias. Her cholesterol is at goal. The cholesterol last visit was:   Lab Results  Component Value Date   CHOL 136 04/17/2019   HDL 55  04/17/2019   LDLCALC 63 04/17/2019   TRIG 97 04/17/2019   CHOLHDL 2.5 04/17/2019   She has been working on diet and exercise for prediabetes,  and denies paresthesia of the feet, polydipsia, polyuria and visual disturbances. Last A1C in the office was:  Lab Results  Component Value Date   HGBA1C 5.7 (H) 04/17/2019   Patient is on Vitamin D supplement for defciency.   Lab Results  Component Value Date   VD25OH 20 (L) 04/17/2019       Patient has diagnosis of B12 deficiency and does not report fatigue. This is not a new diagnosis. @CAPHA @ is not a vegetarian and does not have a previous diagnosis of pernicious anemia. She does not have a history of weight loss surgery.  Taking oral supplement.  Last check was not to goal though in normal range.  Lab Results  Component Value Date   [id number] 10/16/2018       Current Medications:  Current Outpatient Medications on File Prior to Visit  Medication Sig Dispense Refill  . buPROPion (WELLBUTRIN XL) 150 MG 24 hr tablet Take 1 tablet (150 mg total) by mouth daily. 90 tablet 2  . Cholecalciferol 1.25 MG (50000 UT) capsule Take one tablet by mouth three days a week for twelve weeks. 36 capsule 0  . etodolac (LODINE) 400 MG tablet Take 1 tablet (400 mg total) by mouth 2 (two) times daily as needed. 60 tablet 3  . hydrochlorothiazide (MICROZIDE) 12.5 MG capsule Take 1 capsule (12.5 mg total) by mouth daily. 90 capsule 2  . ibuprofen (ADVIL,MOTRIN) 200 MG tablet Take 600 mg by mouth every 6 (six) hours as needed for mild pain.    . meloxicam (MOBIC) 15 MG tablet Take 1 tablet (15 mg total) by mouth daily. 30 tablet 0  . methocarbamol (ROBAXIN-750) 750 MG tablet Take one tablet by mouth at bedtime, as needed.  May take one tablet during the day as needed for muscles spasms. 60 tablet 1  . Multiple Vitamins-Minerals (MULTIVITAMIN GUMMIES ADULT PO) Take by mouth. DAILY    . naproxen (NAPROSYN) 500 MG tablet Take 1 tablet (500 mg total) by mouth as needed.    . phentermine (ADIPEX-P) 37.5 MG tablet TAKE ONE TABLET BY MOUTH DAILY BEFORE BREAKFAST 30 tablet 2  . rizatriptan (MAXALT) 10 MG tablet Take 1 tablet (10 mg total) by mouth once as needed for migraine. May repeat in 2 hours if needed 30 tablet 2  . rosuvastatin (CRESTOR) 5 MG tablet Take 1 tablet (5 mg total) by mouth daily. 90 tablet 2  . SUMAtriptan (IMITREX) 100 MG tablet Take 1 tablet (100 mg total) by mouth once as  needed for migraine. May repeat in 2 hours if headache persists or recurs. (Patient not taking: Reported on 08/23/2019) 10 tablet 2   No current facility-administered medications on file prior to visit.   Health Maintenance:   Immunization History  Administered Date(s) Administered  . Influenza Split 04/17/2019  . Influenza, Seasonal, Injecte, Preservative Fre 07/26/2016  . Influenza-Unspecified 04/28/2018, 02/19/2019  . PFIZER SARS-COV-2 Vaccination 09/08/2019, 09/29/2019  . Tdap 01/10/2011, 08/07/2016   Tetanus: 2018 Pneumovax: N/A Prevnar 13: N/A Flu vaccine: 2018 Zostavax:N/A  LMP: s/p ablation Pap: 2019, Negative Q5years MGM: 12/2018 DEXA: N/A Colonoscopy: N/A  EGD: N/A MRI brain 2006 [place] AB 2015 CXR 2017  Patient Care Team: Unk Pinto, MD as PCP - General (Internal Medicine) Everett Graff, MD as Consulting Physician (Obstetrics and Gynecology)  Medical History:  Past  Medical History:  Diagnosis Date  . Abnormal Pap smear   . Cervical polyp   . Depression   . Headache(784.0)   . Migraines   . Morbid obesity ([place])   . Prediabetes   . Toxemia in pregnancy   . Toxemia in pregnancy   . Umbilical hernia    SOME ABDOMINAL DISCOMFORT  . UTI (urinary tract infection)    ON MEDICATION AS OF 6/10 FOR UTI - HX OF FREQUENT UTI'S  . Vitamin D deficiency    Allergies No Known Allergies  SURGICAL HISTORY She  has a past surgical history that includes Cryotherapy; Endometrial ablation; Tubal ligation; Wisdom tooth extraction; Cesarean section; and Umbilical hernia repair (N/A, 12/27/2013). FAMILY HISTORY Her family history includes Arthritis in her mother; Asthma in her daughter; Breast cancer in her maternal aunt; Hypertension in her brother and mother. SOCIAL HISTORY She  reports that she has never smoked. She has never used smokeless tobacco. She reports current alcohol use. She reports that she does not use drugs.   Review of Systems: Review of Systems   Constitutional: Negative for chills, diaphoresis, fever, malaise/fatigue and weight loss.  HENT: Negative for congestion, ear discharge, ear pain, hearing loss, nosebleeds, sinus pain, sore throat and tinnitus.   Eyes: Negative for blurred vision, double vision, photophobia, pain, discharge and redness.  Respiratory: Negative for cough, hemoptysis, sputum production, shortness of breath, wheezing and stridor.   Cardiovascular: Negative for chest pain, palpitations, orthopnea, claudication, leg swelling and PND.  Gastrointestinal: Negative for abdominal pain, blood in stool, constipation, diarrhea, heartburn, melena, nausea and vomiting.  Genitourinary: Negative for dysuria, flank pain, frequency, hematuria and urgency.  Musculoskeletal: Negative for back pain, falls, joint pain, myalgias and neck pain.  Skin: Negative for itching and rash.  Neurological: Negative for dizziness, tingling, tremors, sensory change, speech change, focal weakness, seizures, loss of consciousness, weakness and headaches.  Endo/Heme/Allergies: Negative for environmental allergies and polydipsia. Does not bruise/bleed easily.  Psychiatric/Behavioral: Negative for depression, hallucinations, memory loss, substance abuse and suicidal ideas. The patient is not nervous/anxious and does not have insomnia.     Physical Exam: Estimated body mass index is 40.82 kg/m as calculated from the following:   Height as of 05/01/19: 5' 1.5" (1.562 m).   Weight as of 08/23/19: 219 lb 9.6 oz (99.6 kg). There were no vitals taken for this visit.   General Appearance: Well nourished, in no apparent distress.  Eyes: PERRLA, EOMs, conjunctiva no swelling or erythema, normal fundi and vessels.  Sinuses: No Frontal/maxillary tenderness  ENT/Mouth: Ext aud canals clear, normal light reflex with TMs without erythema, bulging. Good dentition. No erythema, swelling, or exudate on post pharynx. Tonsils not swollen or erythematous. Hearing  normal.  Neck: Supple, thyroid normal. No bruits  Respiratory: Respiratory effort normal, BS equal bilaterally without rales, rhonchi, wheezing or stridor.  Cardio: RRR without murmurs, rubs or gallops. Brisk peripheral pulses without edema.  Chest: symmetric, with normal excursions and percussion.  Breasts: defer OB/GYN Abdomen: Soft, nontender, no guarding, rebound, hernias, masses, or organomegaly.  Lymphatics: Non tender without lymphadenopathy.  Genitourinary: defer OB/GYN Musculoskeletal: Full ROM all peripheral extremities,5/5 strength, and normal gait.  Skin: Warm, dry without rashes, lesions, ecchymosis. Neuro: Cranial nerves intact, reflexes equal bilaterally. Normal muscle tone, no cerebellar symptoms. Sensation intact.  Psych: Awake and oriented X 3, normal affect, Insight and Judgment appropriate.   EKG: WNL, no ST changes AORTA SCAN: defer  Litsy Epting 1:17 PM [facility]

## 2019-10-25 ENCOUNTER — Ambulatory Visit (INDEPENDENT_AMBULATORY_CARE_PROVIDER_SITE_OTHER): Payer: BC Managed Care – PPO | Admitting: Adult Health Nurse Practitioner

## 2019-10-25 ENCOUNTER — Other Ambulatory Visit: Payer: Self-pay

## 2019-10-25 ENCOUNTER — Encounter: Payer: Self-pay | Admitting: Adult Health Nurse Practitioner

## 2019-10-25 VITALS — BP 124/86 | HR 73 | Temp 97.3°F | Ht 61.5 in | Wt 221.0 lb

## 2019-10-25 DIAGNOSIS — Z0001 Encounter for general adult medical examination with abnormal findings: Secondary | ICD-10-CM

## 2019-10-25 DIAGNOSIS — E538 Deficiency of other specified B group vitamins: Secondary | ICD-10-CM

## 2019-10-25 DIAGNOSIS — Z136 Encounter for screening for cardiovascular disorders: Secondary | ICD-10-CM

## 2019-10-25 DIAGNOSIS — Z1329 Encounter for screening for other suspected endocrine disorder: Secondary | ICD-10-CM

## 2019-10-25 DIAGNOSIS — Z1389 Encounter for screening for other disorder: Secondary | ICD-10-CM | POA: Diagnosis not present

## 2019-10-25 DIAGNOSIS — E559 Vitamin D deficiency, unspecified: Secondary | ICD-10-CM

## 2019-10-25 DIAGNOSIS — Z0000 Encounter for general adult medical examination without abnormal findings: Secondary | ICD-10-CM

## 2019-10-25 DIAGNOSIS — Z131 Encounter for screening for diabetes mellitus: Secondary | ICD-10-CM

## 2019-10-25 DIAGNOSIS — I10 Essential (primary) hypertension: Secondary | ICD-10-CM

## 2019-10-25 DIAGNOSIS — Z6841 Body Mass Index (BMI) 40.0 and over, adult: Secondary | ICD-10-CM

## 2019-10-25 DIAGNOSIS — K219 Gastro-esophageal reflux disease without esophagitis: Secondary | ICD-10-CM

## 2019-10-25 DIAGNOSIS — Z79899 Other long term (current) drug therapy: Secondary | ICD-10-CM

## 2019-10-25 DIAGNOSIS — R87619 Unspecified abnormal cytological findings in specimens from cervix uteri: Secondary | ICD-10-CM

## 2019-10-25 DIAGNOSIS — Z130 Encounter for screening for diseases of the blood and blood-forming organs and certain disorders involving the immune mechanism: Secondary | ICD-10-CM

## 2019-10-25 DIAGNOSIS — G43909 Migraine, unspecified, not intractable, without status migrainosus: Secondary | ICD-10-CM

## 2019-10-25 DIAGNOSIS — Z1331 Encounter for screening for depression: Secondary | ICD-10-CM

## 2019-10-25 DIAGNOSIS — Z13220 Encounter for screening for lipoid disorders: Secondary | ICD-10-CM

## 2019-10-25 DIAGNOSIS — E782 Mixed hyperlipidemia: Secondary | ICD-10-CM

## 2019-10-25 DIAGNOSIS — R7309 Other abnormal glucose: Secondary | ICD-10-CM

## 2019-10-25 MED ORDER — VITAMIN D3 125 MCG (5000 UT) PO CAPS: 1.0000 | ORAL_CAPSULE | Freq: Every day | ORAL | 3 refills | Status: AC

## 2019-10-26 LAB — COMPLETE METABOLIC PANEL WITH GFR
AG Ratio: 1.5 (calc) (ref 1.0–2.5)
ALT: 16 U/L (ref 6–29)
AST: 18 U/L (ref 10–30)
Albumin: 4.2 g/dL (ref 3.6–5.1)
Alkaline phosphatase (APISO): 63 U/L (ref 31–125)
BUN: 8 mg/dL (ref 7–25)
CO2: 30 mmol/L (ref 20–32)
Calcium: 9.3 mg/dL (ref 8.6–10.2)
Chloride: 102 mmol/L (ref 98–110)
Creat: 0.8 mg/dL (ref 0.50–1.10)
GFR, Est African American: 105 mL/min/{1.73_m2} (ref >=60)
GFR, Est Non African American: 90 mL/min/{1.73_m2} (ref >=60)
Globulin: 2.8 g/dL (calc) (ref 1.9–3.7)
Glucose, Bld: 81 mg/dL (ref 65–99)
Potassium: 3.8 mmol/L (ref 3.5–5.3)
Sodium: 140 mmol/L (ref 135–146)
Total Bilirubin: 0.4 mg/dL (ref 0.2–1.2)
Total Protein: 7 g/dL (ref 6.1–8.1)

## 2019-10-26 LAB — CBC WITH DIFFERENTIAL/PLATELET
Absolute Monocytes: 482 cells/uL (ref 200–950)
Basophils Absolute: 26 cells/uL (ref 0–200)
Basophils Relative: 0.3 %
Eosinophils Absolute: 86 cells/uL (ref 15–500)
Eosinophils Relative: 1 %
HCT: 39.4 % (ref 35.0–45.0)
Hemoglobin: 12.7 g/dL (ref 11.7–15.5)
Lymphs Abs: 2055 cells/uL (ref 850–3900)
MCH: 26.7 pg — ABNORMAL LOW (ref 27.0–33.0)
MCHC: 32.2 g/dL (ref 32.0–36.0)
MCV: 82.9 fL (ref 80.0–100.0)
MPV: 9.5 fL (ref 7.5–12.5)
Monocytes Relative: 5.6 %
Neutro Abs: 5951 cells/uL (ref 1500–7800)
Neutrophils Relative %: 69.2 %
Platelets: 316 10*3/uL (ref 140–400)
RBC: 4.75 10*6/uL (ref 3.80–5.10)
RDW: 13.1 % (ref 11.0–15.0)
Total Lymphocyte: 23.9 %
WBC: 8.6 10*3/uL (ref 3.8–10.8)

## 2019-10-26 LAB — URINALYSIS W MICROSCOPIC + REFLEX CULTURE
Bacteria, UA: NONE SEEN /HPF
Bilirubin Urine: NEGATIVE
Glucose, UA: NEGATIVE
Hgb urine dipstick: NEGATIVE
Hyaline Cast: NONE SEEN /LPF
Ketones, ur: NEGATIVE
Leukocyte Esterase: NEGATIVE
Nitrites, Initial: NEGATIVE
Protein, ur: NEGATIVE
RBC / HPF: NONE SEEN /HPF (ref 0–2)
Specific Gravity, Urine: 1.006 (ref 1.001–1.03)
Squamous Epithelial / HPF: NONE SEEN /HPF (ref <=5)
WBC, UA: NONE SEEN /HPF (ref 0–5)
pH: 6 (ref 5.0–8.0)

## 2019-10-26 LAB — NO CULTURE INDICATED

## 2019-10-26 LAB — IRON, TOTAL/TOTAL IRON BINDING CAP
%SAT: 40 % (calc) (ref 16–45)
Iron: 103 ug/dL (ref 40–190)
TIBC: 258 mcg/dL (calc) (ref 250–450)

## 2019-10-26 LAB — LIPID PANEL
Cholesterol: 149 mg/dL (ref <200)
HDL: 53 mg/dL (ref >=50)
LDL Cholesterol (Calc): 78 mg/dL (calc)
Non-HDL Cholesterol (Calc): 96 mg/dL (calc) (ref <130)
Total CHOL/HDL Ratio: 2.8 (calc) (ref <5.0)
Triglycerides: 97 mg/dL (ref <150)

## 2019-10-26 LAB — VITAMIN D 25 HYDROXY (VIT D DEFICIENCY, FRACTURES): Vit D, 25-Hydroxy: 77 ng/mL (ref 30–100)

## 2019-10-26 LAB — TSH: TSH: 1.23 mIU/L

## 2019-10-26 LAB — MICROALBUMIN / CREATININE URINE RATIO
Creatinine, Urine: 39 mg/dL (ref 20–275)
Microalb, Ur: <0.2 mg/dL

## 2019-10-26 LAB — HEMOGLOBIN A1C
Hgb A1c MFr Bld: 5.7 % of total Hgb — ABNORMAL HIGH (ref <5.7)
Mean Plasma Glucose: 117 (calc)
eAG (mmol/L): 6.5 (calc)

## 2019-10-27 ENCOUNTER — Other Ambulatory Visit: Payer: Self-pay | Admitting: Adult Health Nurse Practitioner

## 2019-10-27 DIAGNOSIS — M792 Neuralgia and neuritis, unspecified: Secondary | ICD-10-CM

## 2019-11-15 ENCOUNTER — Other Ambulatory Visit: Payer: Self-pay

## 2019-11-15 ENCOUNTER — Ambulatory Visit (INDEPENDENT_AMBULATORY_CARE_PROVIDER_SITE_OTHER): Payer: BC Managed Care – PPO | Admitting: Physician Assistant

## 2019-11-15 ENCOUNTER — Encounter: Payer: Self-pay | Admitting: Physician Assistant

## 2019-11-15 VITALS — BP 128/74 | HR 73 | Temp 97.5°F | Wt 224.0 lb

## 2019-11-15 DIAGNOSIS — N898 Other specified noninflammatory disorders of vagina: Secondary | ICD-10-CM | POA: Diagnosis not present

## 2019-11-15 MED ORDER — SAXENDA 18 MG/3ML ~~LOC~~ SOPN: 3.0000 mg | PEN_INJECTOR | Freq: Every day | SUBCUTANEOUS | 2 refills | Status: DC

## 2019-11-15 MED ORDER — METRONIDAZOLE 500 MG PO TABS: 500.0000 mg | ORAL_TABLET | Freq: Two times a day (BID) | ORAL | 0 refills | Status: AC

## 2019-11-15 NOTE — Progress Notes (Signed)
Subjective:    Patient ID: Kendra Stein, female    DOB: [birth year], 44 y.o.   MRN: [id number]  HPI 44 y.o. obese AAF with history of migraines, obesity, HTN, chol, history of irregular menses, menorrhagia, pelvic pain, uterine leiomyopma, vaginitis,  abnormal PAP and bartholin's cyst presents with vaginal issues.   She has noticed an odor but no discharge, no itching. Going through a divorce  She has seen Dr. Everett Graff, GYN in the past.  PAP 10/2017- normal and negative HPV AB [place] 10/2013 Pelvic US 11/2011- normal LMP s/p ablation  She was also put on phentermine 04/15 at her CPE, has been off though. Has been under stress, her and her husband is going through divorce. She is interested in saxenda for weight loss.  She has tried phentermine, weight watchers, contrave, belviq without help. She has HTN, chol and would benefit from the medication.   BMI is Body mass index is 41.64 kg/m., she is working on diet and exercise. Wt Readings from Last 10 Encounters:  11/15/19 224 lb (101.6 kg)  10/25/19 221 lb (100.2 kg)  08/23/19 219 lb 9.6 oz (99.6 kg)  07/10/19 218 lb 9.6 oz (99.2 kg)  06/21/19 219 lb 12.8 oz (99.7 kg)  06/05/19 219 lb (99.3 kg)  05/01/19 222 lb (100.7 kg)  11/29/18 235 lb (106.6 kg)  10/16/18 239 lb (108.4 kg)  08/01/18 234 lb (106.1 kg)     Blood pressure 128/74, pulse 73, temperature (!) 97.5 F (36.4 C), weight 224 lb (101.6 kg), SpO2 96 %.  Medications   Current Outpatient Medications (Cardiovascular):  .  hydrochlorothiazide (MICROZIDE) 12.5 MG capsule, Take 1 capsule (12.5 mg total) by mouth daily. .  rosuvastatin (CRESTOR) 5 MG tablet, Take 1 tablet (5 mg total) by mouth daily.   Current Outpatient Medications (Analgesics):  .  etodolac (LODINE) 400 MG tablet, Take 1 tablet (400 mg total) by mouth 2 (two) times daily as needed. Marland Kitchen  ibuprofen (ADVIL,MOTRIN) 200 MG tablet, Take 600 mg by mouth every 6 (six) hours as needed for mild pain. .   meloxicam (MOBIC) 15 MG tablet, Take 1/2 to 1 tablet Daily with Food for Pain & Inflammation - try limit to 5 days /week to Avoid Kidney Damage .  naproxen (NAPROSYN) 500 MG tablet, Take 1 tablet (500 mg total) by mouth as needed. .  rizatriptan (MAXALT) 10 MG tablet, Take 1 tablet (10 mg total) by mouth once as needed for migraine. May repeat in 2 hours if needed .  SUMAtriptan (IMITREX) 100 MG tablet, Take 1 tablet (100 mg total) by mouth once as needed for migraine. May repeat in 2 hours if headache persists or recurs.   Current Outpatient Medications (Other):  Marland Kitchen  buPROPion (WELLBUTRIN XL) 150 MG 24 hr tablet, Take 1 tablet (150 mg total) by mouth daily. .  Cholecalciferol (VITAMIN D3) 125 MCG (5000 UT) CAPS, Take 1 capsule (5,000 Units total) by mouth daily. .  methocarbamol (ROBAXIN-750) 750 MG tablet, Take one tablet by mouth at bedtime, as needed.  May take one tablet during the day as needed for muscles spasms. .  Multiple Vitamins-Minerals (MULTIVITAMIN GUMMIES ADULT PO), Take by mouth. DAILY  Problem list She has Migraines; Vitamin D deficiency; Morbid obesity ([place]); Essential hypertension; Knee pain, bilateral; Medication management; Hyperlipidemia; Abnormal cervical Papanicolaou smear; Cyst of Bartholin's gland duct; Gastroesophageal reflux disease; Morbid obesity with BMI of 40.0-44.9, adult ([place]); Irregular periods; Menorrhagia; Pain in pelvis; Uterine leiomyoma; Vaginitis and vulvovaginitis; and  Abnormal glucose on their problem list.   Review of Systems  Constitutional: Negative.  Negative for chills, fatigue and fever.  HENT: Negative.   Respiratory: Negative.   Cardiovascular: Negative.   Gastrointestinal: Negative.   Genitourinary: Positive for vaginal discharge. Negative for difficulty urinating, dyspareunia, dysuria, genital sores, menstrual problem, pelvic pain, vaginal bleeding and vaginal pain.  Musculoskeletal: Negative.   Skin: Negative.        Objective:    Physical Exam Constitutional:      General: She is not in acute distress.    Appearance: She is obese. She is not ill-appearing.  Cardiovascular:     Rate and Rhythm: Normal rate and regular rhythm.     Pulses: Normal pulses.  Abdominal:     General: Abdomen is protuberant. Bowel sounds are normal.     Palpations: Abdomen is soft.     Tenderness: There is no abdominal tenderness.  Genitourinary:    General: Normal vulva.     Exam position: Supine.     Labia:        Right: No rash or lesion.        Left: No rash or lesion.      Urethra: No prolapse or urethral swelling.     Vagina: Vaginal discharge (clear thin malodorous discharge) present. No erythema, tenderness, bleeding or lesions.     Cervix: Normal.  Neurological:     Mental Status: She is alert.       Assessment & Plan:  Aamanda was seen today for other.  Diagnoses and all orders for this visit:  Vaginal odor -     C. trachomatis/N. gonorrhoeae RNA -     WET PREP BY MOLECULAR PROBE -     metroNIDAZOLE (FLAGYL) 500 MG tablet; Take 1 tablet (500 mg total) by mouth 2 (two) times daily for 7 days.  Morbid obesity (HCC) -     Liraglutide -Weight Management (SAXENDA) 18 MG/3ML SOPN; Inject 0.5 mLs (3 mg total) into the skin daily. Start 0.6mg  [place] daily x 1 week, then increase dose by 0.6mg  daily every week. Max dose is 3 mg a day. She has tried phentermine, weight watchers, contrave, belviq without help. She has HTN, chol and would benefit from the medication or we discussed the gastric sleeve

## 2019-11-15 NOTE — Patient Instructions (Addendum)
If it reoccurs you can get boric acid supporistories prescription from here for [place] or can get from amazon/[facility].  Use dove soap only No dryer sheets hypoallergenic soap for undies.   Bacterial Vaginosis   Bacterial vaginosis is a vaginal infection that occurs when the normal balance of bacteria in the vagina is disrupted. It results from an overgrowth of certain bacteria. This is the most common vaginal infection among women ages 43-44. Because bacterial vaginosis increases your risk for STIs (sexually transmitted infections), getting treated can help reduce your risk for chlamydia, gonorrhea, herpes, and HIV (human immunodeficiency virus). Treatment is also important for preventing complications in pregnant women, because this condition can cause an early (premature) delivery. What are the causes? This condition is caused by an increase in harmful bacteria that are normally present in small amounts in the vagina. However, the reason that the condition develops is not fully understood. What increases the risk? The following factors may make you more likely to develop this condition:  Having a new sexual partner or multiple sexual partners.  Having unprotected sex.  Douching.  Having an intrauterine device (IUD).  Smoking.  Drug and alcohol abuse.  Taking certain antibiotic medicines.  Being pregnant. You cannot get bacterial vaginosis from toilet seats, bedding, swimming pools, or contact with objects around you. What are the signs or symptoms? Symptoms of this condition include:  Grey or white vaginal discharge. The discharge can also be watery or foamy.  A fish-like odor with discharge, especially after sexual intercourse or during menstruation.  Itching in and around the vagina.  Burning or pain with urination. Some women with bacterial vaginosis have no signs or symptoms. How is this diagnosed? This condition is diagnosed based on:  Your medical history.  A  physical exam of the vagina.  Testing a sample of vaginal fluid under a microscope to look for a large amount of bad bacteria or abnormal cells. Your health care provider may use a cotton swab or a small wooden spatula to collect the sample. How is this treated? This condition is treated with antibiotics. These may be given as a pill, a vaginal cream, or a medicine that is put into the vagina (suppository). If the condition comes back after treatment, a second round of antibiotics may be needed. Follow these instructions at home: Medicines  Take over-the-counter and prescription medicines only as told by your health care provider.  Take or use your antibiotic as told by your health care provider. Do not stop taking or using the antibiotic even if you start to feel better. General instructions  If you have a female sexual partner, tell her that you have a vaginal infection. She should see her health care provider and be treated if she has symptoms. If you have a female sexual partner, he does not need treatment.  During treatment: ? Avoid sexual activity until you finish treatment. ? Do not douche. ? Avoid alcohol as directed by your health care provider. ? Avoid breastfeeding as directed by your health care provider.  Drink enough water and fluids to keep your urine clear or pale yellow.  Keep the area around your vagina and rectum clean. ? Wash the area daily with warm water. ? Wipe yourself from front to back after using the toilet.  Keep all follow-up visits as told by your health care provider. This is important. How is this prevented?  Do not douche.  Wash the outside of your vagina with warm water only.  Use protection when having sex. This includes latex condoms and dental dams.  Limit how many sexual partners you have. To help prevent bacterial vaginosis, it is best to have sex with just one partner (monogamous).  Make sure you and your sexual partner are tested for  STIs.  Wear cotton or cotton-lined underwear.  Avoid wearing tight pants and pantyhose, especially during summer.  Limit the amount of alcohol that you drink.  Do not use any products that contain nicotine or tobacco, such as cigarettes and e-cigarettes. If you need help quitting, ask your health care provider.  Do not use illegal drugs. Where to find more information  Centers for Disease Control and Prevention: AppraiserFraud.fi  American Sexual Health Association (ASHA): www.ashastd.org  U.S. Department of Health and Financial controller, Office on Women's Health: DustingSprays.pl or SecuritiesCard.it Contact a health care provider if:  Your symptoms do not improve, even after treatment.  You have more discharge or pain when urinating.  You have a fever.  You have pain in your abdomen.  You have pain during sex.  You have vaginal bleeding between periods. Summary  Bacterial vaginosis is a vaginal infection that occurs when the normal balance of bacteria in the vagina is disrupted.  Because bacterial vaginosis increases your risk for STIs (sexually transmitted infections), getting treated can help reduce your risk for chlamydia, gonorrhea, herpes, and HIV (human immunodeficiency virus). Treatment is also important for preventing complications in pregnant women, because the condition can cause an early (premature) delivery.  This condition is treated with antibiotic medicines. These may be given as a pill, a vaginal cream, or a medicine that is put into the vagina (suppository). This information is not intended to replace advice given to you by your health care provider. Make sure you discuss any questions you have with your health care provider. Document Released: 06/28/2005 Document Revised: 11/01/2016 Document Reviewed: 03/13/2016 Elsevier Interactive Patient Education  2019 [employer].   Please see the instructions on the  medications.  Start on 0.6mg  daily [place] of the medication x 1 week, then increase by 0.6 mg daily every week until you reach 3 mg/day.  If you get nausea stay on the tolerated dose x 1-2 more weeks and then try to move up.   Please go online [email or web address] to get the savings card  Liraglutide injection (Weight Management) What is this medicine? LIRAGLUTIDE (LIR a GLOO tide) is used to help people lose weight and maintain weight loss. It is used with a reduced-calorie diet and exercise. This medicine may be used for other purposes; ask your health care provider or pharmacist if you have questions. COMMON BRAND NAME(S): Saxenda What should I tell my health care provider before I take this medicine? They need to know if you have any of these conditions:  endocrine tumors (MEN 2) or if someone in your family had these tumors  gallbladder disease  high cholesterol  history of alcohol abuse problem  history of pancreatitis  kidney disease or if you are on dialysis  liver disease  previous swelling of the tongue, face, or lips with difficulty breathing, difficulty swallowing, hoarseness, or tightening of the throat  stomach problems  suicidal thoughts, plans, or attempt; a previous suicide attempt by you or a family member  thyroid cancer or if someone in your family had thyroid cancer  an unusual or allergic reaction to liraglutide, other medicines, foods, dyes, or preservatives  pregnant or trying to get pregnant  breast-feeding How should I use this  medicine? This medicine is for injection under the skin of your upper leg, stomach area, or upper arm. You will be taught how to prepare and give this medicine. Use exactly as directed. Take your medicine at regular intervals. Do not take it more often than directed. This drug comes with INSTRUCTIONS FOR USE. Ask your pharmacist for directions on how to use this drug. Read the information carefully. Talk to your pharmacist or health  care provider if you have questions. It is important that you put your used needles and syringes in a special sharps container. Do not put them in a trash can. If you do not have a sharps container, call your pharmacist or healthcare provider to get one. A special MedGuide will be given to you by the pharmacist with each prescription and refill. Be sure to read this information carefully each time. Talk to your pediatrician regarding the use of this medicine in children. Special care may be needed. Overdosage: If you think you have taken too much of this medicine contact a poison control center or emergency room at once. NOTE: This medicine is only for you. Do not share this medicine with others. What if I miss a dose? If you miss a dose, take it as soon as you can. If it is almost time for your next dose, take only that dose. Do not take double or extra doses. If you miss your dose for 3 days or more, call your doctor or health care professional to talk about how to restart this medicine. What may interact with this medicine?  insulin and other medicines for diabetes This list may not describe all possible interactions. Give your health care provider a list of all the medicines, herbs, non-prescription drugs, or dietary supplements you use. Also tell them if you smoke, drink alcohol, or use illegal drugs. Some items may interact with your medicine. What should I watch for while using this medicine? Visit your doctor or health care professional for regular checks on your progress. Drink plenty of fluids while taking this medicine. Check with your doctor or health care professional if you get an attack of severe diarrhea, nausea, and vomiting. The loss of too much body fluid can make it dangerous for you to take this medicine. This medicine may affect blood sugar levels. Ask your healthcare provider if changes in diet or medicines are needed if you have diabetes. Patients and their families should  watch out for worsening depression or thoughts of suicide. Also watch out for sudden changes in feelings such as feeling anxious, agitated, panicky, irritable, hostile, aggressive, impulsive, severely restless, overly excited and hyperactive, or not being able to sleep. If this happens, especially at the beginning of treatment or after a change in dose, call your health care professional. Women should inform their health care provider if they wish to become pregnant or think they might be pregnant. Losing weight while pregnant is not advised and may cause harm to the unborn child. Talk to your health care provider for more information. What side effects may I notice from receiving this medicine? Side effects that you should report to your doctor or health care professional as soon as possible:  allergic reactions like skin rash, itching or hives, swelling of the face, lips, or tongue  breathing problems  diarrhea that continues or is severe  lump or swelling on the neck  severe nausea  signs and symptoms of infection like fever or chills; cough; sore throat; pain or trouble passing  urine  signs and symptoms of low blood sugar such as feeling anxious; confusion; dizziness; increased hunger; unusually weak or tired; increased sweating; shakiness; cold, clammy skin; irritable; headache; blurred vision; fast heartbeat; loss of consciousness  signs and symptoms of kidney injury like trouble passing urine or change in the amount of urine  trouble swallowing  unusual stomach upset or pain  vomiting Side effects that usually do not require medical attention (report to your doctor or health care professional if they continue or are bothersome):  constipation  decreased appetite  diarrhea  fatigue  headache  nausea  pain, redness, or irritation at site where injected  stomach upset  stuffy or runny nose This list may not describe all possible side effects. Call your doctor for  medical advice about side effects. You may report side effects to FDA at 1-800-FDA-1088. Where should I keep my medicine? Keep out of the reach of children. Store unopened pen in a refrigerator between 2 and 8 degrees C (36 and 46 degrees F). Do not freeze or use if the medicine has been frozen. Protect from light and excessive heat. After you first use the pen, it can be stored at room temperature between 15 and 30 degrees C (59 and 86 degrees F) or in a refrigerator. Throw away your used pen after 30 days or after the expiration date, whichever comes first. Do not store your pen with the needle attached. If the needle is left on, medicine may leak from the pen. NOTE: This sheet is a summary. It may not cover all possible information. If you have questions about this medicine, talk to your doctor, pharmacist, or health care provider.  2020 Elsevier/Gold Standard (2019-05-03 21:16:59)    The [facility] at [facility] requires you to complete a free scheduled weight loss seminar or webinar before being scheduled for a consultation.   Please go to the link below to register for a free weight loss seminar or webinar. or call [phone].  Once completed you will be given instructions for the next steps.    Bariatric Surgery Program at [facility]:   [email or web address]      Phone:  [phone]  The 2 different weight loss surgeries are that I can discuss with you is the Gastric sleeve or the gastric bypass.   You will have to see a nutritionist and psychiatrist before and after any of these surgeries.  They help decrease your hunger but they do not fix your relationship with food or unhealthy habits.  In order for these surgeries to be most successful and for you to keep the weight off you have to work on those aspects as well or the weight will come back.   They are both done laparoscopically, these means there is not  a large incision, it is done with tiny incisions made in your abdomen and then a scope is inserted. This helps significantly with recovery and healing time. Very rarely would they have to switch to an open surgery but that is one of the potential complications.   Gastric sleeve The rate of complication with the gastric sleeve is about 1 %.  Weight loss with the gastric sleeve is about 60-80lbs.  80% of your stomach is removed but your intestines remain the where they are.  The part of your stomach that is removed release hunger hormones so this surgery causes you to be less hungry as well not be able to hold a large amount of  food in your stomach.  This one has less nutritionally issues such at vitamin absorption after the surgery than the gastric bypass.  Gastric Bypass The rate of complication with the gastric bypass is about 2-3% Weight loss with the gastric bypass is 100 lbs or more A large portion of your stomach is removed so that it is only the shape of an egg, then part of your intestines is bypassed.  This decreases hunger hormones and if you have diabetes is almost "cured" right after surgery, we are still studying why that is the case and it will hopefully lead to future treatments for diabetes.  Since we are bypassing part of the intestines where absorption of vitamins occur requires you to take vitamins daily for the rest of your life  You have to be more focused on protein and water intake  You need to adjust your eating habits after the surgery drastically afterwards but it has the most weight loss associated with it.

## 2019-11-17 LAB — WET PREP BY MOLECULAR PROBE
Candida species: NOT DETECTED
MICRO NUMBER:: 10449002
SPECIMEN QUALITY:: ADEQUATE
Trichomonas vaginosis: NOT DETECTED

## 2019-11-17 LAB — C. TRACHOMATIS/N. GONORRHOEAE RNA

## 2019-11-19 ENCOUNTER — Other Ambulatory Visit: Payer: Self-pay | Admitting: Physician Assistant

## 2019-11-19 MED ORDER — SAXENDA 18 MG/3ML ~~LOC~~ SOPN: PEN_INJECTOR | SUBCUTANEOUS | 2 refills | Status: DC

## 2019-11-27 ENCOUNTER — Other Ambulatory Visit: Payer: Self-pay

## 2019-11-27 MED ORDER — "PEN NEEDLES 1/2"" 29G X 12MM MISC": 3 refills | Status: DC

## 2019-12-31 ENCOUNTER — Ambulatory Visit
Admission: RE | Admit: 2019-12-31 | Discharge: 2019-12-31 | Disposition: A | Discharge status: Home / Self Care | Payer: BC Managed Care – PPO | Source: Ambulatory Visit | Attending: Internal Medicine | Admitting: Internal Medicine

## 2019-12-31 ENCOUNTER — Ambulatory Visit: Payer: BC Managed Care – PPO

## 2019-12-31 ENCOUNTER — Other Ambulatory Visit: Payer: Self-pay

## 2019-12-31 DIAGNOSIS — Z1231 Encounter for screening mammogram for malignant neoplasm of breast: Secondary | ICD-10-CM

## 2020-04-22 ENCOUNTER — Ambulatory Visit (INDEPENDENT_AMBULATORY_CARE_PROVIDER_SITE_OTHER): Payer: BC Managed Care – PPO | Admitting: Adult Health Nurse Practitioner

## 2020-04-22 ENCOUNTER — Other Ambulatory Visit: Payer: Self-pay

## 2020-04-22 ENCOUNTER — Encounter: Payer: Self-pay | Admitting: Adult Health Nurse Practitioner

## 2020-04-22 VITALS — BP 124/68 | HR 82 | Temp 97.6°F | Wt 214.0 lb

## 2020-04-22 DIAGNOSIS — I10 Essential (primary) hypertension: Secondary | ICD-10-CM | POA: Diagnosis not present

## 2020-04-22 DIAGNOSIS — E782 Mixed hyperlipidemia: Secondary | ICD-10-CM | POA: Diagnosis not present

## 2020-04-22 DIAGNOSIS — R7309 Other abnormal glucose: Secondary | ICD-10-CM | POA: Diagnosis not present

## 2020-04-22 DIAGNOSIS — Z6841 Body Mass Index (BMI) 40.0 and over, adult: Secondary | ICD-10-CM

## 2020-04-22 DIAGNOSIS — Z7689 Persons encountering health services in other specified circumstances: Secondary | ICD-10-CM

## 2020-04-22 MED ORDER — "PEN NEEDLES 1/2"" 29G X 12MM MISC": 4 refills | Status: DC

## 2020-04-22 MED ORDER — SAXENDA 18 MG/3ML ~~LOC~~ SOPN: PEN_INJECTOR | SUBCUTANEOUS | 2 refills | Status: DC

## 2020-04-22 NOTE — Progress Notes (Signed)
Weight Management Follow Up   44 y.o.female presents for a follow up for weight loss. She has lost 10lbs since her last visit while on Saxenda. She follows a healthy diet by recent modification, cutting carbohydrates and meal planning. She has been coordinating with a friend to hare healthy meals weeky which has also helped. She reports she has also increased her activity.  When she she has a break at work she has been walking to pass the time instead of just sitting.  She reports feeling really good about her routine and her clothes have been fitting her differently.  She is motivated to continue these healthy behaviors.   Wt Readings from Last 3 Encounters:  04/22/20 214 lb (97.1 kg)  11/15/19 224 lb (101.6 kg)  10/25/19 221 lb (100.2 kg)    Breakfast:  2eggs, two slices bacon and fruit.  She also drinks water in the morning.  Snack: carrots, cheese or celery and fruit are examples of her typical snacks.  Lunch: Small salad, balsamic dressing, cup a greens   Dinner: Dance movement psychotherapist portioned She reports she will have bread but only once a day IF she has any.   Medications: Current Outpatient Medications on File Prior to Visit  Medication Sig Dispense Refill   buPROPion (WELLBUTRIN XL) 150 MG 24 hr tablet Take 1 tablet (150 mg total) by mouth daily. 90 tablet 2   Cholecalciferol (VITAMIN D3) 125 MCG (5000 UT) CAPS Take 1 capsule (5,000 Units total) by mouth daily. 90 capsule 3   etodolac (LODINE) 400 MG tablet Take 1 tablet (400 mg total) by mouth 2 (two) times daily as needed. 60 tablet 3   hydrochlorothiazide (MICROZIDE) 12.5 MG capsule Take 1 capsule (12.5 mg total) by mouth daily. 90 capsule 2   ibuprofen (ADVIL,MOTRIN) 200 MG tablet Take 600 mg by mouth every 6 (six) hours as needed for mild pain.     meloxicam (MOBIC) 15 MG tablet Take 1/2 to 1 tablet Daily with Food for Pain & Inflammation - try limit to 5 days /week to Avoid Kidney Damage 90 tablet 0   methocarbamol  (ROBAXIN-750) 750 MG tablet Take one tablet by mouth at bedtime, as needed.  May take one tablet during the day as needed for muscles spasms. 60 tablet 1   Multiple Vitamins-Minerals (MULTIVITAMIN GUMMIES ADULT PO) Take by mouth. DAILY     naproxen (NAPROSYN) 500 MG tablet Take 1 tablet (500 mg total) by mouth as needed.     rizatriptan (MAXALT) 10 MG tablet Take 1 tablet (10 mg total) by mouth once as needed for migraine. May repeat in 2 hours if needed 30 tablet 2   rosuvastatin (CRESTOR) 5 MG tablet Take 1 tablet (5 mg total) by mouth daily. 90 tablet 2   No current facility-administered medications on file prior to visit.    ROS: All negative except for above  Physical exam: Vitals:   04/22/20 1400  BP: 124/68  Pulse: 82  Temp: 97.6 F (36.4 C)  SpO2: 100%   Physical Exam Constitutional:      Appearance: Normal appearance.  HENT:     Head: Normocephalic.  Eyes:     Extraocular Movements: Extraocular movements intact.     Pupils: Pupils are equal, round, and reactive to light.  Cardiovascular:     Rate and Rhythm: Normal rate and regular rhythm.     Pulses: Normal pulses.     Heart sounds: Normal heart sounds.  Pulmonary:     Effort: Pulmonary  effort is normal.     Breath sounds: Normal breath sounds.  Musculoskeletal:        General: Normal range of motion.     Cervical back: Normal range of motion.  Skin:    General: Skin is warm and dry.  Neurological:     General: No focal deficit present.     Mental Status: She is alert and oriented to person, place, and time. Mental status is at baseline.  Psychiatric:        Mood and Affect: Mood normal.        Behavior: Behavior normal.        Thought Content: Thought content normal.        Judgment: Judgment normal.     Assessment: Obesity with co morbid conditions.   Plan: Continue Saxenda weekly General weight loss/lifestyle modification strategies discussed (elicit support from others; identify saboteurs;  non-food rewards, etc). Continue food planning, consider diary Continue restricted calorie diet Continue daily exercise as well as behavior modification such as walking further away, putting down the fork, having a plan, using stairs, etc.   Follow up in 1 month   Future Appointments  Date Time Provider [place]  06/17/2020 11:30 AM Garnet Sierras, NP [facility] None  10/23/2020 10:00 AM Burgess Sheriff, Danton Sewer, NP [facility] None     Garnet Sierras, NP 1:17 PM [facility]

## 2020-04-23 LAB — COMPLETE METABOLIC PANEL WITH GFR
AG Ratio: 1.3 (calc) (ref 1.0–2.5)
ALT: 30 U/L — ABNORMAL HIGH (ref 6–29)
AST: 22 U/L (ref 10–30)
Albumin: 4 g/dL (ref 3.6–5.1)
Alkaline phosphatase (APISO): 61 U/L (ref 31–125)
BUN: 10 mg/dL (ref 7–25)
CO2: 29 mmol/L (ref 20–32)
Calcium: 9.2 mg/dL (ref 8.6–10.2)
Chloride: 101 mmol/L (ref 98–110)
Creat: 0.84 mg/dL (ref 0.50–1.10)
GFR, Est African American: 98 mL/min/{1.73_m2} (ref >=60)
GFR, Est Non African American: 85 mL/min/{1.73_m2} (ref >=60)
Globulin: 3.1 g/dL (calc) (ref 1.9–3.7)
Glucose, Bld: 76 mg/dL (ref 65–99)
Potassium: 3.3 mmol/L — ABNORMAL LOW (ref 3.5–5.3)
Sodium: 138 mmol/L (ref 135–146)
Total Bilirubin: 0.5 mg/dL (ref 0.2–1.2)
Total Protein: 7.1 g/dL (ref 6.1–8.1)

## 2020-04-23 LAB — CBC WITH DIFFERENTIAL/PLATELET
Absolute Monocytes: 677 cells/uL (ref 200–950)
Basophils Absolute: 28 cells/uL (ref 0–200)
Basophils Relative: 0.3 %
Eosinophils Absolute: 66 cells/uL (ref 15–500)
Eosinophils Relative: 0.7 %
HCT: 37.9 % (ref 35.0–45.0)
Hemoglobin: 12.5 g/dL (ref 11.7–15.5)
Lymphs Abs: 2087 cells/uL (ref 850–3900)
MCH: 27 pg (ref 27.0–33.0)
MCHC: 33 g/dL (ref 32.0–36.0)
MCV: 81.9 fL (ref 80.0–100.0)
MPV: 9.2 fL (ref 7.5–12.5)
Monocytes Relative: 7.2 %
Neutro Abs: 6542 cells/uL (ref 1500–7800)
Neutrophils Relative %: 69.6 %
Platelets: 312 10*3/uL (ref 140–400)
RBC: 4.63 10*6/uL (ref 3.80–5.10)
RDW: 13.3 % (ref 11.0–15.0)
Total Lymphocyte: 22.2 %
WBC: 9.4 10*3/uL (ref 3.8–10.8)

## 2020-04-23 LAB — HEMOGLOBIN A1C
Hgb A1c MFr Bld: 5.5 % of total Hgb (ref <5.7)
Mean Plasma Glucose: 111 (calc)
eAG (mmol/L): 6.2 (calc)

## 2020-04-23 LAB — LIPID PANEL
Cholesterol: 128 mg/dL (ref <200)
HDL: 47 mg/dL — ABNORMAL LOW (ref >=50)
LDL Cholesterol (Calc): 58 mg/dL (calc)
Non-HDL Cholesterol (Calc): 81 mg/dL (calc) (ref <130)
Total CHOL/HDL Ratio: 2.7 (calc) (ref <5.0)
Triglycerides: 142 mg/dL (ref <150)

## 2020-04-28 NOTE — Telephone Encounter (Signed)
Will fax [place] to [facility] pharmacy. Juluis Rainier)

## 2020-06-17 ENCOUNTER — Other Ambulatory Visit: Payer: Self-pay

## 2020-06-17 ENCOUNTER — Encounter: Payer: Self-pay | Admitting: Adult Health Nurse Practitioner

## 2020-06-17 ENCOUNTER — Ambulatory Visit (INDEPENDENT_AMBULATORY_CARE_PROVIDER_SITE_OTHER): Payer: BC Managed Care – PPO | Admitting: Adult Health Nurse Practitioner

## 2020-06-17 VITALS — BP 126/78 | HR 102 | Temp 97.6°F | Ht 61.0 in | Wt 210.0 lb

## 2020-06-17 DIAGNOSIS — Z23 Encounter for immunization: Secondary | ICD-10-CM

## 2020-06-17 DIAGNOSIS — I10 Essential (primary) hypertension: Secondary | ICD-10-CM

## 2020-06-17 DIAGNOSIS — Z7689 Persons encountering health services in other specified circumstances: Secondary | ICD-10-CM | POA: Diagnosis not present

## 2020-06-17 NOTE — Progress Notes (Signed)
Assessment and Plan:  Gilberto was seen today for follow-up.  Diagnoses and all orders for this visit:  Need for influenza vaccination -     FLU VACCINE MDCK QUAD W/Preservative  Encounter for weight management Weight down 4lbs since last OV Continue Saxenda SQ daily Monitor injection sites, rotate Current goal below 200lbs  Essential hypertension Continue current medications: HCTZ 12.5mg  Monitor blood pressure at home; call if consistently over 130/80 Continue DASH diet.   Reminder to go to the ER if any CP, SOB, nausea, dizziness, severe HA, changes vision/speech, left arm numbness and tingling and jaw pain.   Patient is doing great with current plan.  Encouraged healthy behaviors and continue good work with improving dietary choices and activity.   Follow up in 7months for CPE  Further disposition pending results of labs. Discussed med's effects and SE's.   Over 30 minutes of face to face interview, exam, counseling, chart review, and critical decision making was performed.   Future Appointments  Date Time Provider [place]  10/23/2020 10:00 AM Juanette Urizar, Danton Sewer, NP [facility] None    ------------------------------------------------------------------------------------------------------------------   HPI 44 y.o.female presents for follow up on weight management.  Patient is using Saxenda, SQ injection daily.  She is doing well with injections, administering to self and rotating sites.  Denies any injection sitecomplications.  She is down 4lbs since last OV.  She is down 29lbs since starting weight loss journey at BMI of 44.43. She reports she feels great and has lots of energy.  She has had an improvement in her mood as well.  She reports she enjoys going shopping as clothes seem to fit her better.  She is noticing she has lost inches and her clothing fits differently. She has no health or medication concerns today.  She is due for influenza vaccination.  Past  Medical History:  Diagnosis Date  . Abnormal Pap smear   . Cervical polyp   . Depression   . Headache(784.0)   . Migraines   . Morbid obesity ([place])   . Prediabetes   . Toxemia in pregnancy   . Toxemia in pregnancy   . Umbilical hernia    SOME ABDOMINAL DISCOMFORT  . UTI (urinary tract infection)    ON MEDICATION AS OF 6/10 FOR UTI - HX OF FREQUENT UTI'S  . Vitamin D deficiency      No Known Allergies  Current Outpatient Medications on File Prior to Visit  Medication Sig  . buPROPion (WELLBUTRIN XL) 150 MG 24 hr tablet Take 1 tablet (150 mg total) by mouth daily.  . Cholecalciferol (VITAMIN D3) 125 MCG (5000 UT) CAPS Take 1 capsule (5,000 Units total) by mouth daily.  Marland Kitchen etodolac (LODINE) 400 MG tablet Take 1 tablet (400 mg total) by mouth 2 (two) times daily as needed.  . hydrochlorothiazide (MICROZIDE) 12.5 MG capsule Take 1 capsule (12.5 mg total) by mouth daily.  Marland Kitchen ibuprofen (ADVIL,MOTRIN) 200 MG tablet Take 600 mg by mouth every 6 (six) hours as needed for mild pain.  . Insulin Pen Needle (PEN NEEDLES 29GX1/2") 29G X 12MM MISC Start 0.6 mg [place] daily for 1 week, then increase by o.6mg  daily every week. Max dose is 3 mgs a day  . Liraglutide -Weight Management (SAXENDA) 18 MG/3ML SOPN Inject 3mg  daily [place].  . meloxicam (MOBIC) 15 MG tablet Take 1/2 to 1 tablet Daily with Food for Pain & Inflammation - try limit to 5 days /week to Avoid Kidney Damage  . methocarbamol (ROBAXIN-750) 750 MG  tablet Take one tablet by mouth at bedtime, as needed.  May take one tablet during the day as needed for muscles spasms.  . Multiple Vitamins-Minerals (MULTIVITAMIN GUMMIES ADULT PO) Take by mouth. DAILY  . naproxen (NAPROSYN) 500 MG tablet Take 1 tablet (500 mg total) by mouth as needed.  . rizatriptan (MAXALT) 10 MG tablet Take 1 tablet (10 mg total) by mouth once as needed for migraine. May repeat in 2 hours if needed  . rosuvastatin (CRESTOR) 5 MG tablet Take 1 tablet (5 mg total) by mouth daily.    No current facility-administered medications on file prior to visit.    ROS: all negative except above.   Physical Exam:  BP 126/78   Pulse (!) 102   Temp 97.6 F (36.4 C)   Ht 5\' 1"  (1.549 m)   Wt 210 lb (95.3 kg)   SpO2 98%   BMI 39.68 kg/m   General Appearance: Well nourished, in no apparent distress. Eyes: PERRLA, EOMs, conjunctiva no swelling or erythema Sinuses: No Frontal/maxillary tenderness ENT/Mouth: Ext aud canals clear, TMs without erythema, bulging. No erythema, swelling, or exudate on post pharynx.  Tonsils not swollen or erythematous. Hearing normal.  Neck: Supple, thyroid normal.  Respiratory: Respiratory effort normal, BS equal bilaterally without rales, rhonchi, wheezing or stridor.  Cardio: RRR with no MRGs. Brisk peripheral pulses without edema.  Abdomen: Soft, + BS.  Non tender, no guarding, rebound, hernias, masses. Lymphatics: Non tender without lymphadenopathy.  Musculoskeletal: Full ROM, 5/5 strength, normal gait.  Skin: Warm, dry without rashes, lesions, ecchymosis.  Neuro: Cranial nerves intact. Normal muscle tone, no cerebellar symptoms. Sensation intact.  Psych: Awake and oriented X 3, normal affect, Insight and Judgment appropriate.       Garnet Sierras, Laqueta Jean, DNP [facility] 06/17/2020  11:45 AM

## 2020-06-18 ENCOUNTER — Other Ambulatory Visit: Payer: Self-pay | Admitting: Adult Health Nurse Practitioner

## 2020-06-18 DIAGNOSIS — I10 Essential (primary) hypertension: Secondary | ICD-10-CM

## 2020-06-18 DIAGNOSIS — E782 Mixed hyperlipidemia: Secondary | ICD-10-CM

## 2020-06-18 DIAGNOSIS — Z789 Other specified health status: Secondary | ICD-10-CM

## 2020-06-19 ENCOUNTER — Encounter: Payer: Self-pay | Admitting: Adult Health Nurse Practitioner

## 2020-06-19 ENCOUNTER — Ambulatory Visit (INDEPENDENT_AMBULATORY_CARE_PROVIDER_SITE_OTHER): Payer: BC Managed Care – PPO | Admitting: Adult Health Nurse Practitioner

## 2020-06-19 ENCOUNTER — Other Ambulatory Visit: Payer: Self-pay

## 2020-06-19 VITALS — BP 124/88 | HR 100 | Temp 97.2°F | Wt 212.0 lb

## 2020-06-19 DIAGNOSIS — R310 Gross hematuria: Secondary | ICD-10-CM | POA: Diagnosis not present

## 2020-06-19 DIAGNOSIS — I10 Essential (primary) hypertension: Secondary | ICD-10-CM

## 2020-06-19 NOTE — Addendum Note (Signed)
Addended byGarnet Sierras A on: 06/19/2020 02:20 PM   Modules accepted: Orders

## 2020-06-19 NOTE — Progress Notes (Signed)
Assessment and Plan:  Kendra Stein was seen today for hematuria.  Diagnoses and all orders for this visit:  Gross hematuria -     Urinalysis w microscopic + reflex cultur -     Urine Culture -     Urinalysis, Routine w reflex microscopic -     C. trachomatis/N. gonorrhoeae RNA  Essential hypertension Continue current medications HCTZ 12.5mg  Monitor blood pressure at home; call if consistently over 130/80 Continue DASH diet.   Reminder to go to the ER if any CP, SOB, nausea, dizziness, severe HA, changes vision/speech, left arm numbness and tingling and jaw pain.      Further disposition pending results of labs. Discussed med's effects and SE's.   Over 30 minutes of face to face interview, exam, counseling, chart review, and critical decision making was performed.   Future Appointments  Date Time Provider [place]  10/23/2020 10:00 AM Shell Yandow, Danton Sewer, NP [facility] None    ------------------------------------------------------------------------------------------------------------------   HPI 44 y.o.female presents for hematuria  She reports there was blood in the toil this am.  Last night she was having frequency of urination.  There was blood on the tissue that was pink in color.  Denies any vaginal bleeding, discharge, itching or burning or odor.  Denies, N/V/D, abdominal pain Reports she is sexually active. Tubal ligation and endometrial ablation in the past.  She uses condoms.  No known exposure to STI's.  Requests to check via urine.  Past Medical History:  Diagnosis Date  . Abnormal Pap smear   . Cervical polyp   . Depression   . Headache(784.0)   . Migraines   . Morbid obesity ([place])   . Prediabetes   . Toxemia in pregnancy   . Toxemia in pregnancy   . Umbilical hernia    SOME ABDOMINAL DISCOMFORT  . UTI (urinary tract infection)    ON MEDICATION AS OF 6/10 FOR UTI - HX OF FREQUENT UTI'S  . Vitamin D deficiency      No Known Allergies  Current  Outpatient Medications on File Prior to Visit  Medication Sig  . buPROPion (WELLBUTRIN XL) 150 MG 24 hr tablet TAKE ONE TABLET BY MOUTH DAILY  . Cholecalciferol (VITAMIN D3) 125 MCG (5000 UT) CAPS Take 1 capsule (5,000 Units total) by mouth daily.  Marland Kitchen etodolac (LODINE) 400 MG tablet Take 1 tablet (400 mg total) by mouth 2 (two) times daily as needed.  . hydrochlorothiazide (MICROZIDE) 12.5 MG capsule TAKE ONE CAPSULE BY MOUTH DAILY  . ibuprofen (ADVIL,MOTRIN) 200 MG tablet Take 600 mg by mouth every 6 (six) hours as needed for mild pain.  . Insulin Pen Needle (PEN NEEDLES 29GX1/2") 29G X 12MM MISC Start 0.6 mg [place] daily for 1 week, then increase by o.6mg  daily every week. Max dose is 3 mgs a day  . Liraglutide -Weight Management (SAXENDA) 18 MG/3ML SOPN Inject 3mg  daily [place].  . meloxicam (MOBIC) 15 MG tablet Take 1/2 to 1 tablet Daily with Food for Pain & Inflammation - try limit to 5 days /week to Avoid Kidney Damage  . methocarbamol (ROBAXIN-750) 750 MG tablet Take one tablet by mouth at bedtime, as needed.  May take one tablet during the day as needed for muscles spasms.  . Multiple Vitamins-Minerals (MULTIVITAMIN GUMMIES ADULT PO) Take by mouth. DAILY  . naproxen (NAPROSYN) 500 MG tablet Take 1 tablet (500 mg total) by mouth as needed.  . rizatriptan (MAXALT) 10 MG tablet Take 1 tablet (10 mg total) by mouth once as  needed for migraine. May repeat in 2 hours if needed  . rosuvastatin (CRESTOR) 5 MG tablet TAKE ONE TABLET BY MOUTH DAILY   No current facility-administered medications on file prior to visit.    ROS: all negative except above.   Physical Exam:  BP 124/88   Pulse 100   Temp (!) 97.2 F (36.2 C)   Wt 212 lb (96.2 kg)   SpO2 99%   BMI 40.06 kg/m   General Appearance: Well nourished, in no apparent distress. Eyes: PERRLA, EOMs, conjunctiva no swelling or erythema Sinuses: No Frontal/maxillary tenderness ENT/Mouth: Ext aud canals clear, TMs without erythema, bulging. No  erythema, swelling, or exudate on post pharynx.  Tonsils not swollen or erythematous. Hearing normal.  Neck: Supple, thyroid normal.  Respiratory: Respiratory effort normal, BS equal bilaterally without rales, rhonchi, wheezing or stridor.  Cardio: RRR with no MRGs. Brisk peripheral pulses without edema.  Abdomen: Soft, + BS.  Non tender, no guarding, rebound, hernias, masses. Lymphatics: Non tender without lymphadenopathy.  Musculoskeletal: Full ROM, 5/5 strength, normal gait.  Skin: Warm, dry without rashes, lesions, ecchymosis.  Neuro: Cranial nerves intact. Normal muscle tone, no cerebellar symptoms. Sensation intact.  Psych: Awake and oriented X 3, normal affect, Insight and Judgment appropriate.      Garnet Sierras, Laqueta Jean, DNP [facility] 06/19/2020  2:18 PM

## 2020-06-20 DIAGNOSIS — N3001 Acute cystitis with hematuria: Secondary | ICD-10-CM

## 2020-06-20 MED ORDER — SULFAMETHOXAZOLE-TRIMETHOPRIM 800-160 MG PO TABS: 1.0000 | ORAL_TABLET | Freq: Two times a day (BID) | ORAL | 0 refills | Status: DC

## 2020-06-22 ENCOUNTER — Other Ambulatory Visit: Payer: Self-pay | Admitting: Adult Health Nurse Practitioner

## 2020-06-22 DIAGNOSIS — B9620 Unspecified Escherichia coli [E. coli] as the cause of diseases classified elsewhere: Secondary | ICD-10-CM

## 2020-06-22 DIAGNOSIS — N390 Urinary tract infection, site not specified: Secondary | ICD-10-CM

## 2020-06-22 LAB — URINALYSIS W MICROSCOPIC + REFLEX CULTURE
Bacteria, UA: NONE SEEN /HPF
Bilirubin Urine: NEGATIVE
Glucose, UA: NEGATIVE
Hyaline Cast: NONE SEEN /LPF
Ketones, ur: NEGATIVE
Nitrites, Initial: NEGATIVE
Specific Gravity, Urine: 1.012 (ref 1.001–1.03)
Squamous Epithelial / HPF: NONE SEEN /HPF (ref <=5)
pH: 6.5 (ref 5.0–8.0)

## 2020-06-22 LAB — C. TRACHOMATIS/N. GONORRHOEAE RNA
C. trachomatis RNA, TMA: NOT DETECTED
N. gonorrhoeae RNA, TMA: NOT DETECTED

## 2020-06-22 LAB — URINE CULTURE
MICRO NUMBER:: 11300917
SPECIMEN QUALITY:: ADEQUATE

## 2020-06-22 LAB — CULTURE INDICATED

## 2020-06-22 MED ORDER — NITROFURANTOIN MONOHYD MACRO 100 MG PO CAPS: 100.0000 mg | ORAL_CAPSULE | Freq: Two times a day (BID) | ORAL | 0 refills | Status: DC

## 2020-09-09 ENCOUNTER — Ambulatory Visit (INDEPENDENT_AMBULATORY_CARE_PROVIDER_SITE_OTHER): Payer: BC Managed Care – PPO

## 2020-09-09 ENCOUNTER — Other Ambulatory Visit: Payer: Self-pay

## 2020-09-09 VITALS — BP 124/76 | HR 66 | Temp 98.1°F | Wt 210.0 lb

## 2020-09-09 DIAGNOSIS — R319 Hematuria, unspecified: Secondary | ICD-10-CM

## 2020-09-09 NOTE — Progress Notes (Signed)
Pt reports for BLOOD in urine yesterday but has since cleared up. Patient reports a little bit of discomfort but nothing else. Would like a ABX sent to pharmacy.

## 2020-09-10 LAB — URINALYSIS, ROUTINE W REFLEX MICROSCOPIC
Bilirubin Urine: NEGATIVE
Glucose, UA: NEGATIVE
Hgb urine dipstick: NEGATIVE
Ketones, ur: NEGATIVE
Leukocytes,Ua: NEGATIVE
Nitrite: NEGATIVE
Protein, ur: NEGATIVE
Specific Gravity, Urine: 1.011 (ref 1.001–1.03)
pH: <=5 (ref 5.0–8.0)

## 2020-09-10 LAB — URINE CULTURE
MICRO NUMBER:: 11592953
SPECIMEN QUALITY:: ADEQUATE

## 2020-10-23 ENCOUNTER — Encounter: Payer: BC Managed Care – PPO | Admitting: Adult Health Nurse Practitioner

## 2021-01-28 ENCOUNTER — Other Ambulatory Visit: Payer: Self-pay | Admitting: Nurse Practitioner

## 2021-01-28 DIAGNOSIS — Z1231 Encounter for screening mammogram for malignant neoplasm of breast: Secondary | ICD-10-CM

## 2021-01-30 ENCOUNTER — Other Ambulatory Visit: Payer: Self-pay

## 2021-01-30 ENCOUNTER — Ambulatory Visit
Admission: RE | Admit: 2021-01-30 | Discharge: 2021-01-30 | Disposition: A | Discharge status: Home / Self Care | Payer: BC Managed Care – PPO | Source: Ambulatory Visit | Attending: Nurse Practitioner | Admitting: Nurse Practitioner

## 2021-01-30 DIAGNOSIS — Z1231 Encounter for screening mammogram for malignant neoplasm of breast: Secondary | ICD-10-CM

## 2021-02-02 ENCOUNTER — Ambulatory Visit: Payer: BC Managed Care – PPO

## 2021-03-19 ENCOUNTER — Other Ambulatory Visit: Payer: Self-pay | Admitting: Adult Health Nurse Practitioner

## 2021-03-19 DIAGNOSIS — Z789 Other specified health status: Secondary | ICD-10-CM

## 2021-03-19 DIAGNOSIS — I10 Essential (primary) hypertension: Secondary | ICD-10-CM

## 2021-03-19 DIAGNOSIS — E782 Mixed hyperlipidemia: Secondary | ICD-10-CM

## 2021-03-22 ENCOUNTER — Other Ambulatory Visit: Payer: Self-pay | Admitting: Adult Health Nurse Practitioner

## 2021-03-22 DIAGNOSIS — I10 Essential (primary) hypertension: Secondary | ICD-10-CM

## 2021-03-22 DIAGNOSIS — E782 Mixed hyperlipidemia: Secondary | ICD-10-CM

## 2021-03-22 DIAGNOSIS — Z789 Other specified health status: Secondary | ICD-10-CM

## 2021-04-06 ENCOUNTER — Other Ambulatory Visit: Payer: Self-pay | Admitting: Adult Health Nurse Practitioner

## 2021-04-06 DIAGNOSIS — E782 Mixed hyperlipidemia: Secondary | ICD-10-CM

## 2021-04-06 DIAGNOSIS — I10 Essential (primary) hypertension: Secondary | ICD-10-CM

## 2021-04-06 DIAGNOSIS — Z789 Other specified health status: Secondary | ICD-10-CM

## 2021-04-09 ENCOUNTER — Other Ambulatory Visit: Payer: Self-pay | Admitting: Adult Health Nurse Practitioner

## 2021-04-09 DIAGNOSIS — I10 Essential (primary) hypertension: Secondary | ICD-10-CM

## 2021-04-09 DIAGNOSIS — Z789 Other specified health status: Secondary | ICD-10-CM

## 2021-04-09 DIAGNOSIS — E782 Mixed hyperlipidemia: Secondary | ICD-10-CM

## 2021-05-22 ENCOUNTER — Other Ambulatory Visit: Payer: Self-pay | Admitting: Adult Health Nurse Practitioner

## 2021-05-22 DIAGNOSIS — Z789 Other specified health status: Secondary | ICD-10-CM

## 2021-06-20 ENCOUNTER — Other Ambulatory Visit: Payer: Self-pay | Admitting: Obstetrics and Gynecology

## 2021-06-20 DIAGNOSIS — R102 Pelvic and perineal pain: Secondary | ICD-10-CM

## 2021-07-09 ENCOUNTER — Ambulatory Visit
Admission: RE | Admit: 2021-07-09 | Discharge: 2021-07-09 | Disposition: A | Discharge status: Home / Self Care | Payer: BC Managed Care – PPO | Source: Ambulatory Visit | Attending: Obstetrics and Gynecology | Admitting: Obstetrics and Gynecology

## 2021-07-09 DIAGNOSIS — R102 Pelvic and perineal pain: Secondary | ICD-10-CM

## 2021-08-03 ENCOUNTER — Other Ambulatory Visit: Payer: Self-pay | Admitting: Adult Health Nurse Practitioner

## 2021-08-03 DIAGNOSIS — B9620 Unspecified Escherichia coli [E. coli] as the cause of diseases classified elsewhere: Secondary | ICD-10-CM

## 2021-11-28 IMAGING — MG MM DIGITAL SCREENING BILAT W/ TOMO AND CAD
8 series · 8 of 24 positions shown · non-contrast
Comparison: Previous exam(s).

CLINICAL DATA: Screening.

EXAM:
DIGITAL SCREENING BILATERAL MAMMOGRAM WITH TOMOSYNTHESIS AND CAD
TECHNIQUE: Bilateral screening digital craniocaudal and mediolateral oblique
mammograms were obtained. Bilateral screening digital breast
tomosynthesis was performed. The images were evaluated with
computer-aided detection.

[L MLO synth-2D]
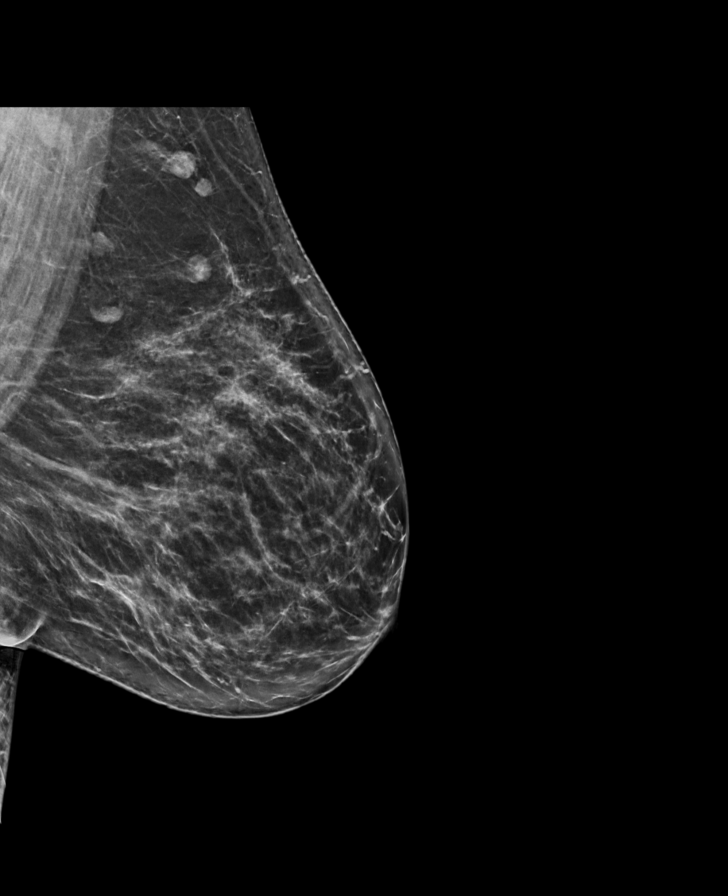

[R MLO synth-2D]
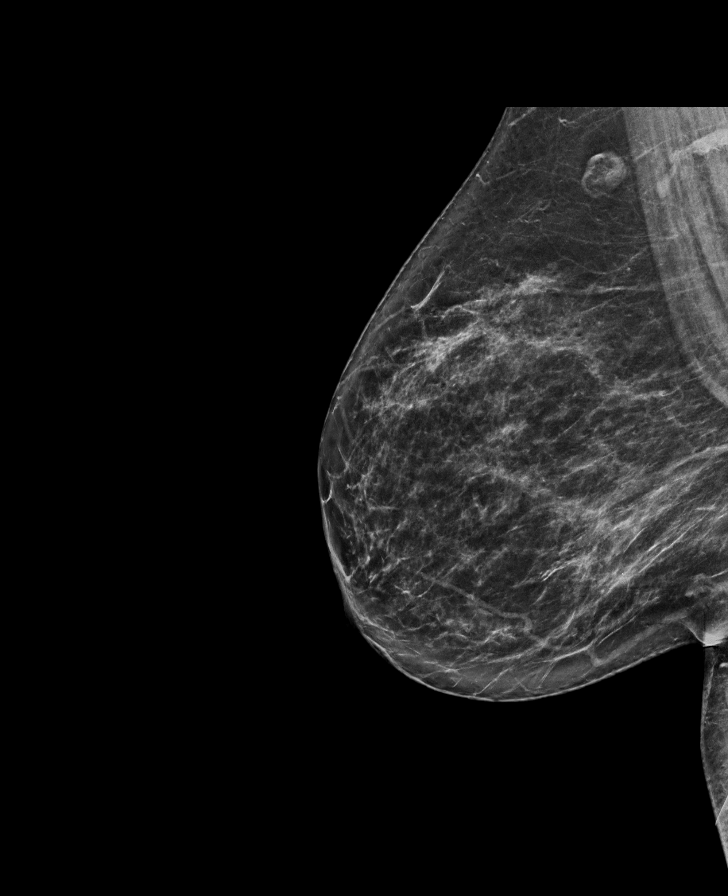

[L CC synth-2D]
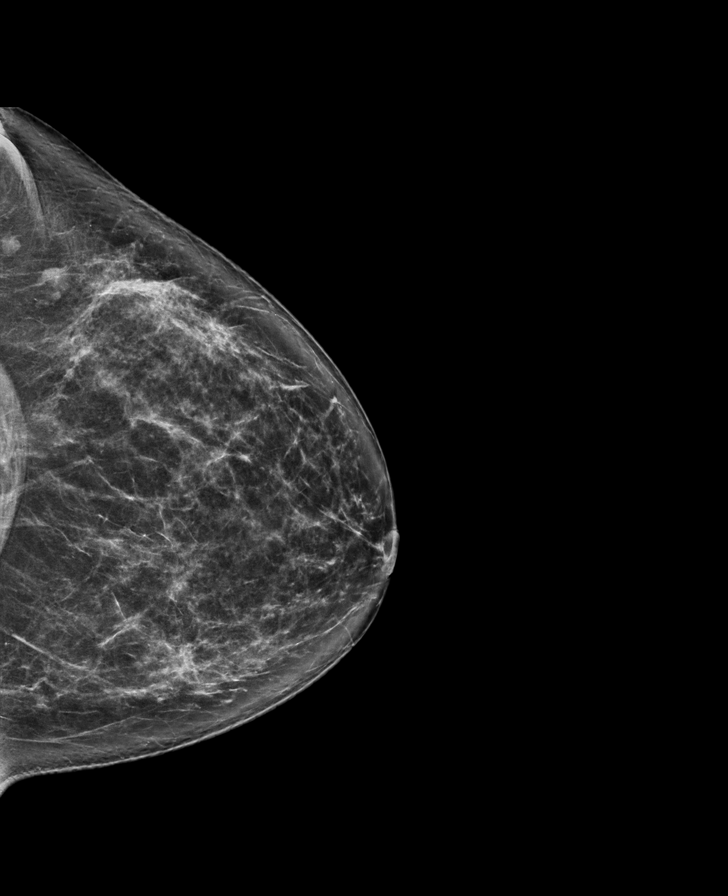

[R CC synth-2D]
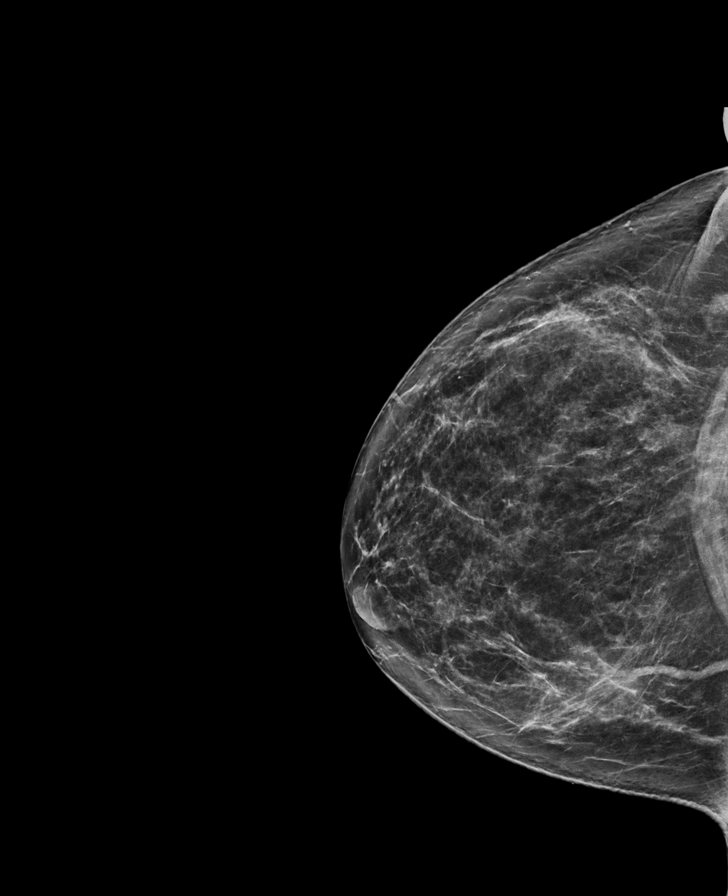

[L CC tomo · tomo slice 39/77.0]
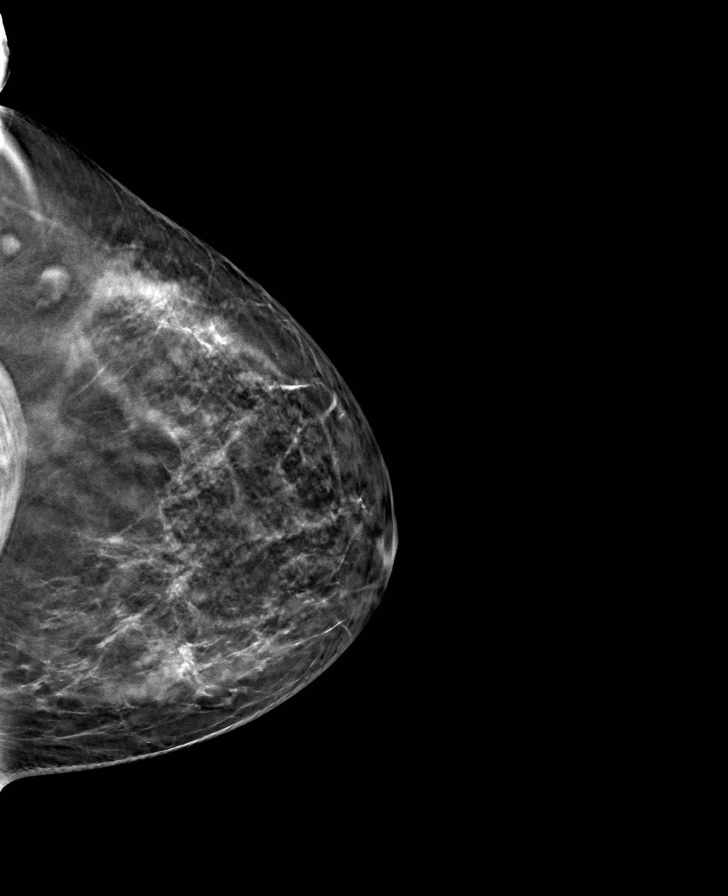

[R MLO tomo · tomo slice 39/77.0]
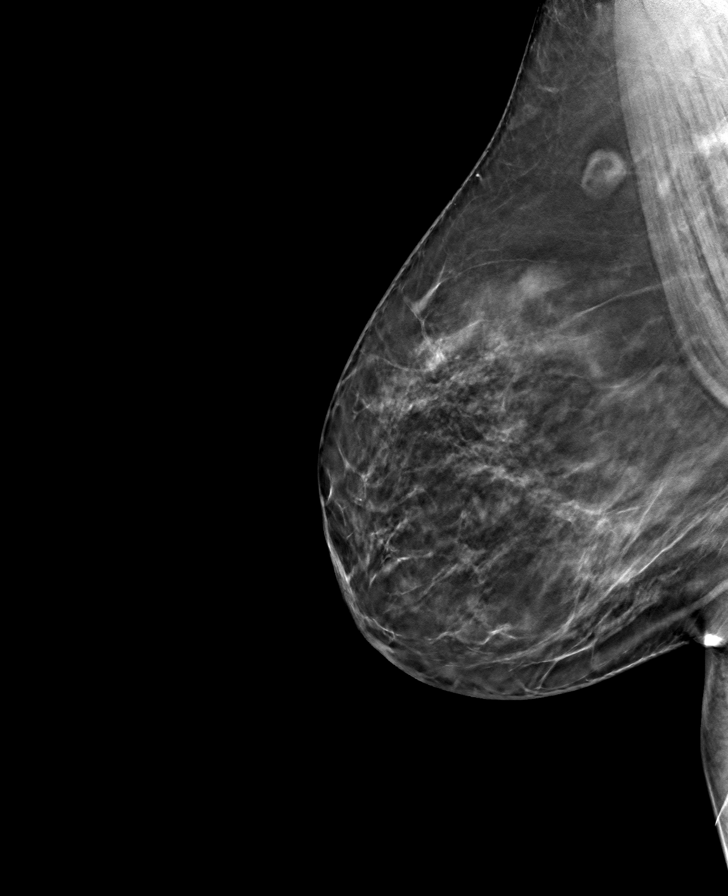

[R CC tomo · tomo slice 40/79.0]
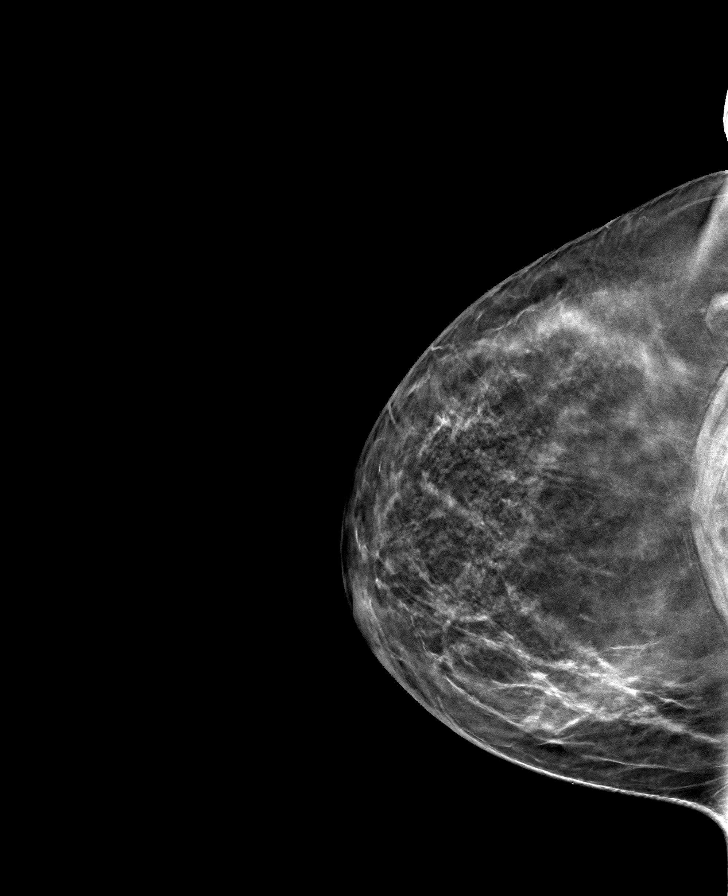

[L MLO tomo · tomo slice 41/80.0]
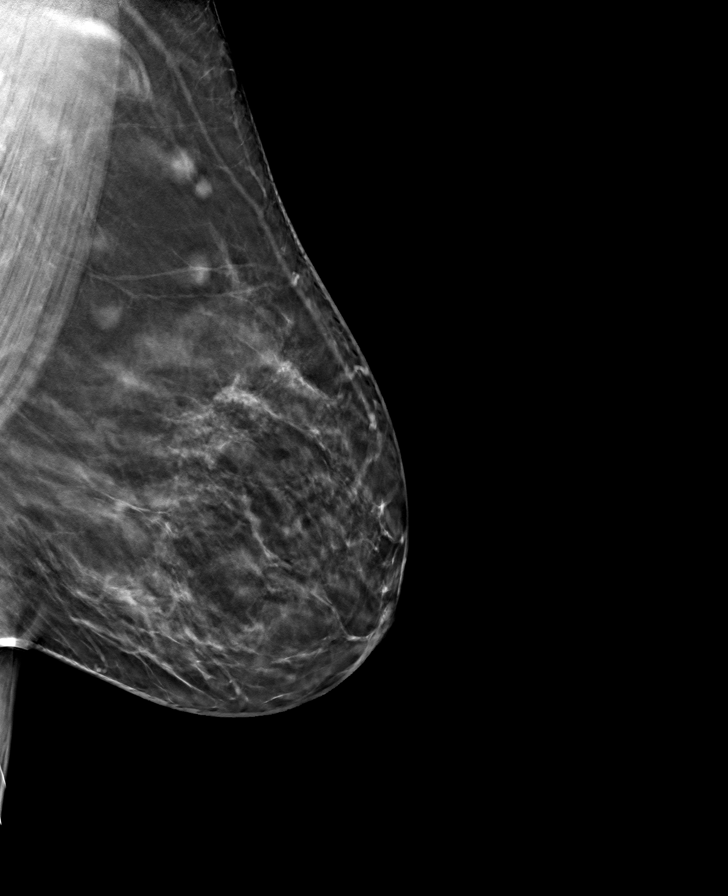

[8 of 24 positions shown; findings below may reference images not displayed]

ACR Breast Density Category b: There are scattered areas of
fibroglandular density.
FINDINGS: There are no findings suspicious for malignancy.
IMPRESSION: No mammographic evidence of malignancy. A result letter of this
screening mammogram will be mailed directly to the patient.

RECOMMENDATION:
Screening mammogram in one year. (Code:51-O-LD2)

BI-RADS CATEGORY  1: Negative.

## 2022-02-02 ENCOUNTER — Encounter (HOSPITAL_BASED_OUTPATIENT_CLINIC_OR_DEPARTMENT_OTHER): Payer: Self-pay | Admitting: Obstetrics and Gynecology

## 2022-02-03 ENCOUNTER — Other Ambulatory Visit: Payer: Self-pay

## 2022-02-03 ENCOUNTER — Encounter (HOSPITAL_BASED_OUTPATIENT_CLINIC_OR_DEPARTMENT_OTHER): Payer: Self-pay | Admitting: Obstetrics and Gynecology

## 2022-02-03 DIAGNOSIS — T8859XA Other complications of anesthesia, initial encounter: Secondary | ICD-10-CM

## 2022-02-03 HISTORY — DX: Other complications of anesthesia, initial encounter: T88.59XA

## 2022-02-03 NOTE — Progress Notes (Signed)
Spoke w/ via phone for pre-op interview---[facility] needs dos---- urine pregnancy per anesthesia, surgeon orders pending as of 02/03/22              Lab results------02/10/22 lab appt for cbc, type & screen, bmp, EKG COVID test -----patient states asymptomatic no test needed Arrive at -------0530 on Friday, 02/12/2022 NPO after MN NO Solid Food.  Clear liquids from MN until---0430 Med rec completed Medications to take morning of surgery -----Maxalt prn, Rosuvastatin Diabetic medication -----n/a Patient instructed no nail polish to be worn day of surgery Patient instructed to bring photo id and insurance card day of surgery Patient aware to have Driver (ride ) / caregiver    for 24 hours after surgery - daughters, Lurline Idol & [facility] Patient Special Instructions -----Extended / Overnight stay instructions given. Pre-Op special Istructions -----Patient wants anesthesia to be aware that she wakes up from anesthesia with shaking and chills. She requests that warm blankets be put on her.  Requested orders via Epic IB from Dr. Garwin Brothers on 02/02/22. Patient verbalized understanding of instructions that were given at this phone interview. Patient denies shortness of breath, chest pain, fever, cough at this phone interview.

## 2022-02-03 NOTE — Progress Notes (Signed)
Your procedure is scheduled on Friday, 02/12/2022.  Report to [place]   Call this number if you have problems the morning of surgery  [phone].   OUR ADDRESS IS [address].  WE ARE LOCATED IN [facility].  PLEASE BRING YOUR INSURANCE CARD AND PHOTO ID DAY OF SURGERY.  ONLY 2 PEOPLE ARE ALLOWED IN  WAITING  ROOM.                                      REMEMBER:  DO NOT EAT FOOD, CANDY GUM OR MINTS  AFTER MIDNIGHT THE NIGHT BEFORE YOUR SURGERY . YOU MAY HAVE CLEAR LIQUIDS FROM MIDNIGHT THE NIGHT BEFORE YOUR SURGERY UNTIL 4:30 AM. NO CLEAR LIQUIDS AFTER   4:30 AM DAY OF SURGERY.  YOU MAY  BRUSH YOUR TEETH MORNING OF SURGERY AND RINSE YOUR MOUTH OUT, NO CHEWING GUM CANDY OR MINTS.     CLEAR LIQUID DIET   Foods Allowed                                                                     Foods Excluded  Coffee and tea, regular and decaf                             liquids that you cannot  Plain Jell-O                                                                   see through such as: Fruit ices (not with fruit pulp)                                     milk, soups, orange juice  Plain  Popsicles                                    All solid food Carbonated beverages, regular and diet                                    Cranberry, grape and apple juices Sports drinks like Gatorade _____________________________________________________________________     TAKE THESE MEDICATIONS MORNING OF SURGERY: Rosuvastatin, Maxalt if needed    UP TO 4 VISITORS  MAY VISIT IN THE EXTENDED RECOVERY ROOM UNTIL 800 PM ONLY.  ONE  VISITOR AGE 3 AND OVER MAY SPEND THE NIGHT AND MUST BE IN EXTENDED RECOVERY ROOM NO LATER THAN 800 PM . YOUR DISCHARGE TIME AFTER YOU SPEND THE NIGHT IS 900 AM THE MORNING AFTER YOUR SURGERY.  YOU MAY PACK A SMALL OVERNIGHT BAG WITH TOILETRIES FOR YOUR OVERNIGHT STAY  IF YOU WISH.  YOUR PRESCRIPTION MEDICATIONS  WILL BE PROVIDED DURING [place].                                      DO NOT WEAR JEWERLY, MAKE UP. DO NOT WEAR LOTIONS, POWDERS, PERFUMES OR NAIL POLISH ON YOUR FINGERNAILS. TOENAIL POLISH IS OK TO WEAR. DO NOT SHAVE FOR 48 HOURS PRIOR TO DAY OF SURGERY. MEN MAY SHAVE FACE AND NECK. CONTACTS, GLASSES, OR DENTURES MAY NOT BE WORN TO SURGERY.  REMEMBER: NO SMOKING, DRUGS OR ALCOHOL FOR 24 HOURS BEFORE YOUR SURGERY.                                    [facility] IS NOT RESPONSIBLE  FOR ANY BELONGINGS.                                                                    Marland Kitchen           [place] - Preparing for Surgery Before surgery, you can play an important role.  Because skin is not sterile, your skin needs to be as free of germs as possible.  You can reduce the number of germs on your skin by washing with CHG (chlorahexidine gluconate) soap before surgery.  CHG is an antiseptic cleaner which kills germs and bonds with the skin to continue killing germs even after washing. Please DO NOT use if you have an allergy to CHG or antibacterial soaps.  If your skin becomes reddened/irritated stop using the CHG and inform your nurse when you arrive at Short Stay. Do not shave (including legs and underarms) for at least 48 hours prior to the first CHG shower.  You may shave your face/neck. Please follow these instructions carefully:  1.  Shower with CHG Soap the night before surgery and the  morning of Surgery.  2.  If you choose to wash your hair, wash your hair first as usual with your  normal  shampoo.  3.  After you shampoo, rinse your hair and body thoroughly to remove the  shampoo.                            4.  Use CHG as you would any other liquid soap.  You can apply chg directly  to the skin and wash , please wash your belly button thoroughly with chg soap provided night before and morning of your surgery.                     Gently with a scrungie or clean washcloth.  5.  Apply the  CHG Soap to your body ONLY FROM THE NECK DOWN.   Do not use on face/ open                           Wound or open sores. Avoid contact with eyes, ears mouth and genitals (private parts).  Wash face,  Genitals (private parts) with your normal soap.             6.  Wash thoroughly, paying special attention to the area where your surgery  will be performed.  7.  Thoroughly rinse your body with warm water from the neck down.  8.  DO NOT shower/wash with your normal soap after using and rinsing off  the CHG Soap.                9.  Pat yourself dry with a clean towel.            10.  Wear clean pajamas.            11.  Place clean sheets on your bed the night of your first shower and do not  sleep with pets. Day of Surgery : Do not apply any lotions/deodorants the morning of surgery.  Please wear clean clothes to the hospital/surgery center.  IF YOU HAVE ANY SKIN IRRITATION OR PROBLEMS WITH THE SURGICAL SOAP, PLEASE GET A BAR OF GOLD DIAL SOAP AND SHOWER THE NIGHT BEFORE YOUR SURGERY AND THE MORNING OF YOUR SURGERY. PLEASE LET THE NURSE KNOW MORNING OF YOUR SURGERY IF YOU HAD ANY PROBLEMS WITH THE SURGICAL SOAP.   ________________________________________________________________________                                                        QUESTIONS [place] PRE OP NURSE PHONE [phone].

## 2022-02-05 ENCOUNTER — Other Ambulatory Visit: Payer: Self-pay | Admitting: Nurse Practitioner

## 2022-02-05 DIAGNOSIS — Z1231 Encounter for screening mammogram for malignant neoplasm of breast: Secondary | ICD-10-CM

## 2022-02-10 ENCOUNTER — Encounter (HOSPITAL_COMMUNITY)
Admission: RE | Admit: 2022-02-10 | Discharge: 2022-02-10 | Disposition: A | Discharge status: Home / Self Care | Payer: BC Managed Care – PPO | Source: Ambulatory Visit | Attending: Obstetrics and Gynecology | Admitting: Obstetrics and Gynecology

## 2022-02-10 DIAGNOSIS — R102 Pelvic and perineal pain: Secondary | ICD-10-CM | POA: Diagnosis not present

## 2022-02-10 DIAGNOSIS — D259 Leiomyoma of uterus, unspecified: Secondary | ICD-10-CM | POA: Diagnosis not present

## 2022-02-10 DIAGNOSIS — Z6837 Body mass index (BMI) 37.0-37.9, adult: Secondary | ICD-10-CM | POA: Diagnosis not present

## 2022-02-10 DIAGNOSIS — Z01818 Encounter for other preprocedural examination: Secondary | ICD-10-CM | POA: Insufficient documentation

## 2022-02-10 DIAGNOSIS — I10 Essential (primary) hypertension: Secondary | ICD-10-CM | POA: Diagnosis not present

## 2022-02-10 DIAGNOSIS — N946 Dysmenorrhea, unspecified: Secondary | ICD-10-CM | POA: Diagnosis present

## 2022-02-10 DIAGNOSIS — M1990 Unspecified osteoarthritis, unspecified site: Secondary | ICD-10-CM | POA: Diagnosis not present

## 2022-02-10 DIAGNOSIS — Z98891 History of uterine scar from previous surgery: Secondary | ICD-10-CM | POA: Diagnosis not present

## 2022-02-10 DIAGNOSIS — N736 Female pelvic peritoneal adhesions (postinfective): Secondary | ICD-10-CM | POA: Diagnosis not present

## 2022-02-10 DIAGNOSIS — Z90710 Acquired absence of both cervix and uterus: Secondary | ICD-10-CM | POA: Diagnosis present

## 2022-02-10 DIAGNOSIS — N888 Other specified noninflammatory disorders of cervix uteri: Secondary | ICD-10-CM | POA: Diagnosis not present

## 2022-02-10 DIAGNOSIS — F32A Depression, unspecified: Secondary | ICD-10-CM | POA: Diagnosis not present

## 2022-02-10 DIAGNOSIS — Z79899 Other long term (current) drug therapy: Secondary | ICD-10-CM | POA: Diagnosis not present

## 2022-02-10 LAB — BASIC METABOLIC PANEL
Anion gap: 7 (ref 5–15)
BUN: 11 mg/dL (ref 6–20)
CO2: 26 mmol/L (ref 22–32)
Calcium: 9.2 mg/dL (ref 8.9–10.3)
Chloride: 104 mmol/L (ref 98–111)
Creatinine, Ser: 0.68 mg/dL (ref 0.44–1.00)
GFR, Estimated: >60 mL/min (ref >60)
Glucose, Bld: 99 mg/dL (ref 70–99)
Potassium: 3.1 mmol/L — ABNORMAL LOW (ref 3.5–5.1)
Sodium: 137 mmol/L (ref 135–145)

## 2022-02-10 LAB — CBC
HCT: 37 % (ref 36.0–46.0)
Hemoglobin: 12.5 g/dL (ref 12.0–15.0)
MCH: 27.5 pg (ref 26.0–34.0)
MCHC: 33.8 g/dL (ref 30.0–36.0)
MCV: 81.3 fL (ref 80.0–100.0)
Platelets: 298 10*3/uL (ref 150–400)
RBC: 4.55 MIL/uL (ref 3.87–5.11)
RDW: 13.2 % (ref 11.5–15.5)
WBC: 10.6 10*3/uL — ABNORMAL HIGH (ref 4.0–10.5)
nRBC: 0 % (ref 0.0–0.2)

## 2022-02-11 ENCOUNTER — Other Ambulatory Visit: Payer: Self-pay | Admitting: Obstetrics and Gynecology

## 2022-02-11 NOTE — Anesthesia Preprocedure Evaluation (Addendum)
Anesthesia Evaluation  Patient identified by MRN, date of birth, ID band Patient awake    Reviewed: Allergy & Precautions, NPO status , Patient's Chart, lab work & pertinent test results  Airway Mallampati: II  TM Distance: >3 FB Neck ROM: Full    Dental no notable dental hx.    Pulmonary neg pulmonary ROS,    Pulmonary exam normal        Cardiovascular hypertension, Pt. on medications Normal cardiovascular exam     Neuro/Psych  Headaches, Seizures -,  PSYCHIATRIC DISORDERS Depression    GI/Hepatic negative GI ROS, Neg liver ROS,   Endo/Other  negative endocrine ROS  Renal/GU negative Renal ROS     Musculoskeletal  (+) Arthritis ,   Abdominal (+) + obese,   Peds  Hematology negative hematology ROS (+)   Anesthesia Other Findings Severe Dysmynorrhea, Hx endometrial ablation  Reproductive/Obstetrics hcg negative                            Anesthesia Physical Anesthesia Plan  ASA: 2  Anesthesia Plan: General   Post-op Pain Management:    Induction: Intravenous  PONV Risk Score and Plan: 4 or greater and Ondansetron, Dexamethasone, Midazolam, Scopolamine patch - Pre-op and Treatment may vary due to age or medical condition  Airway Management Planned: Oral ETT  Additional Equipment:   Intra-op Plan:   Post-operative Plan: Extubation in OR  Informed Consent: I have reviewed the patients History and Physical, chart, labs and discussed the procedure including the risks, benefits and alternatives for the proposed anesthesia with the patient or authorized representative who has indicated his/her understanding and acceptance.     Dental advisory given  Plan Discussed with: CRNA  Anesthesia Plan Comments:        Anesthesia Quick Evaluation

## 2022-02-11 NOTE — Progress Notes (Signed)
Patient called in to verify arrival time for surgery on 02/12/22 at 5:30 am at [facility].

## 2022-02-12 ENCOUNTER — Other Ambulatory Visit: Payer: Self-pay

## 2022-02-12 ENCOUNTER — Encounter (HOSPITAL_BASED_OUTPATIENT_CLINIC_OR_DEPARTMENT_OTHER)
Admission: RE | Disposition: A | Discharge status: Home / Self Care | Payer: Self-pay | Source: Home / Self Care | Attending: Obstetrics and Gynecology

## 2022-02-12 ENCOUNTER — Ambulatory Visit (HOSPITAL_BASED_OUTPATIENT_CLINIC_OR_DEPARTMENT_OTHER): Payer: BC Managed Care – PPO | Admitting: Anesthesiology

## 2022-02-12 ENCOUNTER — Ambulatory Visit (HOSPITAL_BASED_OUTPATIENT_CLINIC_OR_DEPARTMENT_OTHER)
Admission: RE | Admit: 2022-02-12 | Discharge: 2022-02-13 | Disposition: A | Discharge status: Home / Self Care | Payer: BC Managed Care – PPO | Attending: Obstetrics and Gynecology | Admitting: Obstetrics and Gynecology

## 2022-02-12 ENCOUNTER — Encounter (HOSPITAL_BASED_OUTPATIENT_CLINIC_OR_DEPARTMENT_OTHER): Payer: Self-pay | Admitting: Obstetrics and Gynecology

## 2022-02-12 DIAGNOSIS — N888 Other specified noninflammatory disorders of cervix uteri: Secondary | ICD-10-CM | POA: Diagnosis not present

## 2022-02-12 DIAGNOSIS — Z90710 Acquired absence of both cervix and uterus: Secondary | ICD-10-CM | POA: Diagnosis present

## 2022-02-12 DIAGNOSIS — Z79899 Other long term (current) drug therapy: Secondary | ICD-10-CM | POA: Insufficient documentation

## 2022-02-12 DIAGNOSIS — Z6837 Body mass index (BMI) 37.0-37.9, adult: Secondary | ICD-10-CM | POA: Insufficient documentation

## 2022-02-12 DIAGNOSIS — N946 Dysmenorrhea, unspecified: Secondary | ICD-10-CM | POA: Diagnosis not present

## 2022-02-12 DIAGNOSIS — M1990 Unspecified osteoarthritis, unspecified site: Secondary | ICD-10-CM | POA: Insufficient documentation

## 2022-02-12 DIAGNOSIS — N736 Female pelvic peritoneal adhesions (postinfective): Secondary | ICD-10-CM | POA: Insufficient documentation

## 2022-02-12 DIAGNOSIS — R102 Pelvic and perineal pain: Secondary | ICD-10-CM | POA: Insufficient documentation

## 2022-02-12 DIAGNOSIS — D259 Leiomyoma of uterus, unspecified: Secondary | ICD-10-CM | POA: Diagnosis not present

## 2022-02-12 DIAGNOSIS — F32A Depression, unspecified: Secondary | ICD-10-CM | POA: Insufficient documentation

## 2022-02-12 DIAGNOSIS — Z98891 History of uterine scar from previous surgery: Secondary | ICD-10-CM | POA: Insufficient documentation

## 2022-02-12 DIAGNOSIS — I10 Essential (primary) hypertension: Secondary | ICD-10-CM | POA: Insufficient documentation

## 2022-02-12 DIAGNOSIS — Z01818 Encounter for other preprocedural examination: Secondary | ICD-10-CM

## 2022-02-12 HISTORY — PX: ROBOTIC ASSISTED LAPAROSCOPIC HYSTERECTOMY AND SALPINGECTOMY: SHX6379

## 2022-02-12 HISTORY — DX: Unspecified convulsions: R56.9

## 2022-02-12 LAB — ABO/RH: ABO/RH(D): B NEG

## 2022-02-12 LAB — TYPE AND SCREEN
ABO/RH(D): B NEG
Antibody Screen: NEGATIVE

## 2022-02-12 LAB — POCT PREGNANCY, URINE: Preg Test, Ur: NEGATIVE

## 2022-02-12 SURGERY — XI ROBOTIC ASSISTED LAPAROSCOPIC HYSTERECTOMY AND SALPINGECTOMY
Anesthesia: General | Site: Abdomen

## 2022-02-12 MED ORDER — LIDOCAINE HCL (CARDIAC) PF 100 MG/5ML IV SOSY
PREFILLED_SYRINGE | INTRAVENOUS | Status: DC | PRN
  Administered 2022-02-12: 60 mg via INTRAVENOUS

## 2022-02-12 MED ORDER — ROCURONIUM BROMIDE 10 MG/ML (PF) SYRINGE
PREFILLED_SYRINGE | INTRAVENOUS | Status: AC
  Filled 2022-02-12: qty 10

## 2022-02-12 MED ORDER — ACETAMINOPHEN 500 MG PO TABS
1000.0000 mg | ORAL_TABLET | Freq: Once | ORAL | Status: AC
Start: 2022-02-12 — End: 2022-02-12
  Administered 2022-02-12: 1000 mg via ORAL

## 2022-02-12 MED ORDER — SCOPOLAMINE 1 MG/3DAYS TD PT72
MEDICATED_PATCH | TRANSDERMAL | Status: AC
  Filled 2022-02-12: qty 1

## 2022-02-12 MED ORDER — OXYCODONE HCL 5 MG PO TABS
5.0000 mg | ORAL_TABLET | ORAL | Status: DC | PRN
  Administered 2022-02-13 (×2): 5 mg via ORAL
  Administered 2022-02-13: 10 mg via ORAL

## 2022-02-12 MED ORDER — OXYCODONE HCL 5 MG PO TABS: 5.0000 mg | ORAL_TABLET | Freq: Once | ORAL | Status: DC | PRN

## 2022-02-12 MED ORDER — PROPOFOL 10 MG/ML IV BOLUS
INTRAVENOUS | Status: DC | PRN
  Administered 2022-02-12: 200 mg via INTRAVENOUS

## 2022-02-12 MED ORDER — PHENYLEPHRINE 80 MCG/ML (10ML) SYRINGE FOR IV PUSH (FOR BLOOD PRESSURE SUPPORT)
PREFILLED_SYRINGE | INTRAVENOUS | Status: AC
  Filled 2022-02-12: qty 10

## 2022-02-12 MED ORDER — KETOROLAC TROMETHAMINE 30 MG/ML IJ SOLN
INTRAMUSCULAR | Status: DC | PRN
  Administered 2022-02-12: 30 mg via INTRAVENOUS

## 2022-02-12 MED ORDER — PHENYLEPHRINE HCL (PRESSORS) 10 MG/ML IV SOLN
INTRAVENOUS | Status: DC | PRN
  Administered 2022-02-12 (×6): 80 ug via INTRAVENOUS

## 2022-02-12 MED ORDER — KETOROLAC TROMETHAMINE 30 MG/ML IJ SOLN
INTRAMUSCULAR | Status: AC
  Filled 2022-02-12: qty 1

## 2022-02-12 MED ORDER — HEMOSTATIC AGENTS (NO CHARGE) OPTIME
TOPICAL | Status: DC | PRN
  Administered 2022-02-12: 1 via TOPICAL

## 2022-02-12 MED ORDER — HYDROMORPHONE HCL 2 MG/ML IJ SOLN
INTRAMUSCULAR | Status: AC
  Filled 2022-02-12: qty 1

## 2022-02-12 MED ORDER — DEXAMETHASONE SODIUM PHOSPHATE 10 MG/ML IJ SOLN
INTRAMUSCULAR | Status: AC
  Filled 2022-02-12: qty 1

## 2022-02-12 MED ORDER — CEFAZOLIN SODIUM-DEXTROSE 2-4 GM/100ML-% IV SOLN
2.0000 g | INTRAVENOUS | Status: AC
  Administered 2022-02-12: 2 g via INTRAVENOUS

## 2022-02-12 MED ORDER — CEFAZOLIN SODIUM-DEXTROSE 2-4 GM/100ML-% IV SOLN
INTRAVENOUS | Status: AC
  Filled 2022-02-12: qty 100

## 2022-02-12 MED ORDER — HYDROCHLOROTHIAZIDE 12.5 MG PO TABS
12.5000 mg | ORAL_TABLET | Freq: Every day | ORAL | Status: DC
  Administered 2022-02-12: 12.5 mg via ORAL
  Filled 2022-02-12: qty 1

## 2022-02-12 MED ORDER — HYDROMORPHONE HCL 1 MG/ML IJ SOLN
INTRAMUSCULAR | Status: DC | PRN
  Administered 2022-02-12 (×4): .5 mg via INTRAVENOUS

## 2022-02-12 MED ORDER — GLYCOPYRROLATE PF 0.2 MG/ML IJ SOSY
PREFILLED_SYRINGE | INTRAMUSCULAR | Status: AC
  Filled 2022-02-12: qty 1

## 2022-02-12 MED ORDER — FENTANYL CITRATE (PF) 100 MCG/2ML IJ SOLN
INTRAMUSCULAR | Status: AC
  Filled 2022-02-12: qty 2

## 2022-02-12 MED ORDER — MIDAZOLAM HCL 5 MG/5ML IJ SOLN
INTRAMUSCULAR | Status: DC | PRN
  Administered 2022-02-12: 2 mg via INTRAVENOUS

## 2022-02-12 MED ORDER — PROMETHAZINE HCL 25 MG/ML IJ SOLN: 6.2500 mg | INTRAMUSCULAR | Status: DC | PRN

## 2022-02-12 MED ORDER — PANTOPRAZOLE SODIUM 40 MG PO TBEC
40.0000 mg | DELAYED_RELEASE_TABLET | Freq: Every day | ORAL | Status: DC
  Administered 2022-02-12: 40 mg via ORAL

## 2022-02-12 MED ORDER — SUGAMMADEX SODIUM 200 MG/2ML IV SOLN
INTRAVENOUS | Status: DC | PRN
  Administered 2022-02-12: 200 mg via INTRAVENOUS

## 2022-02-12 MED ORDER — KETOROLAC TROMETHAMINE 30 MG/ML IJ SOLN: 30.0000 mg | Freq: Once | INTRAMUSCULAR | Status: DC | PRN

## 2022-02-12 MED ORDER — POVIDONE-IODINE 10 % EX SWAB
2.0000 | Freq: Once | CUTANEOUS | Status: AC
  Administered 2022-02-12: 2 via TOPICAL

## 2022-02-12 MED ORDER — SIMETHICONE 80 MG PO CHEW
80.0000 mg | CHEWABLE_TABLET | Freq: Four times a day (QID) | ORAL | Status: DC | PRN
  Administered 2022-02-13: 80 mg via ORAL

## 2022-02-12 MED ORDER — MENTHOL 3 MG MT LOZG: 1.0000 | LOZENGE | OROMUCOSAL | Status: DC | PRN

## 2022-02-12 MED ORDER — ONDANSETRON HCL 4 MG PO TABS: 4.0000 mg | ORAL_TABLET | Freq: Four times a day (QID) | ORAL | Status: DC | PRN

## 2022-02-12 MED ORDER — PANTOPRAZOLE SODIUM 40 MG PO TBEC
DELAYED_RELEASE_TABLET | ORAL | Status: AC
  Filled 2022-02-12: qty 1

## 2022-02-12 MED ORDER — GLYCOPYRROLATE 0.2 MG/ML IJ SOLN
INTRAMUSCULAR | Status: DC | PRN
  Administered 2022-02-12: .2 mg via INTRAVENOUS

## 2022-02-12 MED ORDER — SENNOSIDES-DOCUSATE SODIUM 8.6-50 MG PO TABS
1.0000 | ORAL_TABLET | Freq: Every evening | ORAL | Status: DC | PRN
  Filled 2022-02-12: qty 1

## 2022-02-12 MED ORDER — PROPOFOL 10 MG/ML IV BOLUS
INTRAVENOUS | Status: AC
  Filled 2022-02-12: qty 20

## 2022-02-12 MED ORDER — STERILE WATER FOR IRRIGATION IR SOLN
Status: DC | PRN
  Administered 2022-02-12: 500 mL

## 2022-02-12 MED ORDER — SODIUM CHLORIDE 0.9 % IR SOLN
Status: DC | PRN
  Administered 2022-02-12: 500 mL

## 2022-02-12 MED ORDER — AMISULPRIDE (ANTIEMETIC) 5 MG/2ML IV SOLN: 10.0000 mg | Freq: Once | INTRAVENOUS | Status: DC | PRN

## 2022-02-12 MED ORDER — ACETAMINOPHEN 500 MG PO TABS
ORAL_TABLET | ORAL | Status: AC
  Filled 2022-02-12: qty 2

## 2022-02-12 MED ORDER — FENTANYL CITRATE (PF) 250 MCG/5ML IJ SOLN
INTRAMUSCULAR | Status: AC
  Filled 2022-02-12: qty 5

## 2022-02-12 MED ORDER — SCOPOLAMINE 1 MG/3DAYS TD PT72
1.0000 | MEDICATED_PATCH | TRANSDERMAL | Status: DC
Start: 2022-02-12 — End: 2022-02-12
  Administered 2022-02-12: 1.5 mg via TRANSDERMAL

## 2022-02-12 MED ORDER — ONDANSETRON HCL 4 MG/2ML IJ SOLN
INTRAMUSCULAR | Status: DC | PRN
  Administered 2022-02-12: 4 mg via INTRAVENOUS

## 2022-02-12 MED ORDER — FENTANYL CITRATE (PF) 100 MCG/2ML IJ SOLN: 25.0000 ug | INTRAMUSCULAR | Status: DC | PRN

## 2022-02-12 MED ORDER — LIDOCAINE HCL (PF) 2 % IJ SOLN
INTRAMUSCULAR | Status: AC
  Filled 2022-02-12: qty 5

## 2022-02-12 MED ORDER — ROCURONIUM BROMIDE 100 MG/10ML IV SOLN
INTRAVENOUS | Status: DC | PRN
  Administered 2022-02-12 (×3): 20 mg via INTRAVENOUS
  Administered 2022-02-12: 50 mg via INTRAVENOUS
  Administered 2022-02-12: 10 mg via INTRAVENOUS

## 2022-02-12 MED ORDER — LACTATED RINGERS IV SOLN: INTRAVENOUS | Status: DC

## 2022-02-12 MED ORDER — BUPIVACAINE HCL (PF) 0.25 % IJ SOLN
INTRAMUSCULAR | Status: DC | PRN
  Administered 2022-02-12: 14 mL

## 2022-02-12 MED ORDER — KETOROLAC TROMETHAMINE 30 MG/ML IJ SOLN
30.0000 mg | Freq: Four times a day (QID) | INTRAMUSCULAR | Status: DC
  Administered 2022-02-12 – 2022-02-13 (×3): 30 mg via INTRAVENOUS

## 2022-02-12 MED ORDER — DEXAMETHASONE SODIUM PHOSPHATE 4 MG/ML IJ SOLN
INTRAMUSCULAR | Status: DC | PRN
  Administered 2022-02-12: 10 mg via INTRAVENOUS

## 2022-02-12 MED ORDER — IBUPROFEN 200 MG PO TABS: 600.0000 mg | ORAL_TABLET | Freq: Four times a day (QID) | ORAL | Status: DC

## 2022-02-12 MED ORDER — POTASSIUM CHLORIDE CRYS ER 20 MEQ PO TBCR
40.0000 meq | EXTENDED_RELEASE_TABLET | Freq: Two times a day (BID) | ORAL | Status: DC
  Administered 2022-02-12 (×2): 40 meq via ORAL
  Filled 2022-02-12 (×2): qty 2

## 2022-02-12 MED ORDER — OXYCODONE HCL 5 MG/5ML PO SOLN: 5.0000 mg | Freq: Once | ORAL | Status: DC | PRN

## 2022-02-12 MED ORDER — DEXTROSE IN LACTATED RINGERS 5 % IV SOLN
INTRAVENOUS | Status: DC
  Administered 2022-02-12: 1 mL via INTRAVENOUS

## 2022-02-12 MED ORDER — SODIUM CHLORIDE 0.9 % IR SOLN
Status: DC | PRN
  Administered 2022-02-12: 400 mL

## 2022-02-12 MED ORDER — FENTANYL CITRATE (PF) 100 MCG/2ML IJ SOLN
INTRAMUSCULAR | Status: DC | PRN
Start: 2022-02-12 — End: 2022-02-12
  Administered 2022-02-12 (×2): 50 ug via INTRAVENOUS
  Administered 2022-02-12: 100 ug via INTRAVENOUS
  Administered 2022-02-12: 50 ug via INTRAVENOUS
  Administered 2022-02-12: 100 ug via INTRAVENOUS

## 2022-02-12 MED ORDER — MIDAZOLAM HCL 2 MG/2ML IJ SOLN
INTRAMUSCULAR | Status: AC
  Filled 2022-02-12: qty 2

## 2022-02-12 MED ORDER — ONDANSETRON HCL 4 MG/2ML IJ SOLN: 4.0000 mg | Freq: Four times a day (QID) | INTRAMUSCULAR | Status: DC | PRN

## 2022-02-12 SURGICAL SUPPLY — 69 items
ADH SKN CLS APL DERMABOND .7 (GAUZE/BANDAGES/DRESSINGS) ×1
APL PRP STRL LF DISP 70% ISPRP (MISCELLANEOUS) ×1
APL SRG 38 LTWT LNG FL B (MISCELLANEOUS) ×1
APPLICATOR ARISTA FLEXITIP XL (MISCELLANEOUS) ×1 IMPLANT
BARRIER ADHS 3X4 INTERCEED (GAUZE/BANDAGES/DRESSINGS) IMPLANT
BRR ADH 4X3 ABS CNTRL BYND (GAUZE/BANDAGES/DRESSINGS)
CATH FOLEY 3WAY  5CC 16FR (CATHETERS) ×2
CATH FOLEY 3WAY 5CC 16FR (CATHETERS) ×2 IMPLANT
CHLORAPREP W/TINT 26 (MISCELLANEOUS) ×1 IMPLANT
COVER BACK TABLE 60X90IN (DRAPES) ×3 IMPLANT
COVER TIP SHEARS 8 DVNC (MISCELLANEOUS) ×2 IMPLANT
COVER TIP SHEARS 8MM DA VINCI (MISCELLANEOUS) ×2
DECANTER SPIKE VIAL GLASS SM (MISCELLANEOUS) ×3 IMPLANT
DEFOGGER SCOPE WARMER CLEARIFY (MISCELLANEOUS) ×3 IMPLANT
DERMABOND ADVANCED (GAUZE/BANDAGES/DRESSINGS) ×1
DERMABOND ADVANCED .7 DNX12 (GAUZE/BANDAGES/DRESSINGS) ×2 IMPLANT
DRAPE ARM DVNC X/XI (DISPOSABLE) ×8 IMPLANT
DRAPE COLUMN DVNC XI (DISPOSABLE) ×2 IMPLANT
DRAPE DA VINCI XI ARM (DISPOSABLE) ×8
DRAPE DA VINCI XI COLUMN (DISPOSABLE) ×2
DRAPE UTILITY XL STRL (DRAPES) ×3 IMPLANT
DURAPREP 26ML APPLICATOR (WOUND CARE) ×2 IMPLANT
ELECT REM PT RETURN 9FT ADLT (ELECTROSURGICAL) ×2
ELECTRODE REM PT RTRN 9FT ADLT (ELECTROSURGICAL) ×2 IMPLANT
GAUZE 4X4 16PLY ~~LOC~~+RFID DBL (SPONGE) ×5 IMPLANT
GLOVE BIOGEL PI IND STRL 7.0 (GLOVE) ×10 IMPLANT
GLOVE BIOGEL PI INDICATOR 7.0 (GLOVE) ×5
GLOVE ECLIPSE 6.5 STRL STRAW (GLOVE) ×9 IMPLANT
HEMOSTAT ARISTA ABSORB 3G PWDR (HEMOSTASIS) ×1 IMPLANT
HIBICLENS CHG 4% 4OZ BTL (MISCELLANEOUS) ×2 IMPLANT
HOLDER FOLEY CATH W/STRAP (MISCELLANEOUS) IMPLANT
IRRIG SUCT STRYKERFLOW 2 WTIP (MISCELLANEOUS) ×2
IRRIGATION SUCT STRKRFLW 2 WTP (MISCELLANEOUS) ×2 IMPLANT
KIT TURNOVER CYSTO (KITS) ×3 IMPLANT
LEGGING LITHOTOMY PAIR STRL (DRAPES) ×3 IMPLANT
LIGASURE VESSEL 5MM BLUNT TIP (ELECTROSURGICAL) ×1 IMPLANT
NEEDLE INSUFFLATION 120MM (ENDOMECHANICALS) ×3 IMPLANT
OBTURATOR OPTICAL STANDARD 8MM (TROCAR) ×2
OBTURATOR OPTICAL STND 8 DVNC (TROCAR) ×1
OBTURATOR OPTICALSTD 8 DVNC (TROCAR) ×2 IMPLANT
OCCLUDER COLPOPNEUMO (BALLOONS) ×3 IMPLANT
PACK ROBOT WH (CUSTOM PROCEDURE TRAY) ×3 IMPLANT
PACK ROBOTIC GOWN (GOWN DISPOSABLE) ×3 IMPLANT
PACK TRENDGUARD 450 HYBRID PRO (MISCELLANEOUS) ×2 IMPLANT
PAD OB MATERNITY 4.3X12.25 (PERSONAL CARE ITEMS) ×3 IMPLANT
PAD PREP 24X48 CUFFED NSTRL (MISCELLANEOUS) ×3 IMPLANT
PROTECTOR NERVE ULNAR (MISCELLANEOUS) ×4 IMPLANT
SEAL CANN UNIV 5-8 DVNC XI (MISCELLANEOUS) ×8 IMPLANT
SEAL XI 5MM-8MM UNIVERSAL (MISCELLANEOUS) ×8
SEALER VESSEL DA VINCI XI (MISCELLANEOUS) ×2
SEALER VESSEL EXT DVNC XI (MISCELLANEOUS) ×2 IMPLANT
SET IRRIG Y TYPE TUR BLADDER L (SET/KITS/TRAYS/PACK) IMPLANT
SET TRI-LUMEN FLTR TB AIRSEAL (TUBING) ×3 IMPLANT
SPONGE T-LAP 4X18 ~~LOC~~+RFID (SPONGE) ×3 IMPLANT
SUT VIC AB 0 CT1 36 (SUTURE) ×6 IMPLANT
SUT VIC AB 4-0 PS2 18 (SUTURE) ×6 IMPLANT
SUT VICRYL 4-0 PS2 18IN ABS (SUTURE) ×2 IMPLANT
SUT VLOC 180 0 9IN  GS21 (SUTURE) ×2
SUT VLOC 180 0 9IN GS21 (SUTURE) ×2 IMPLANT
TIP RUMI ORANGE 6.7MMX12CM (TIP) IMPLANT
TIP UTERINE 5.1X6CM LAV DISP (MISCELLANEOUS) ×2 IMPLANT
TIP UTERINE 6.7X10CM GRN DISP (MISCELLANEOUS) IMPLANT
TIP UTERINE 6.7X6CM WHT DISP (MISCELLANEOUS) IMPLANT
TIP UTERINE 6.7X8CM BLUE DISP (MISCELLANEOUS) IMPLANT
TOWEL OR 17X26 10 PK STRL BLUE (TOWEL DISPOSABLE) ×5 IMPLANT
TRENDGUARD 450 HYBRID PRO PACK (MISCELLANEOUS) ×2
TROCAR PORT AIRSEAL 8X120 (TROCAR) ×4 IMPLANT
WATER STERILE IRR 1000ML POUR (IV SOLUTION) ×2 IMPLANT
WATER STERILE IRR 500ML POUR (IV SOLUTION) ×1 IMPLANT

## 2022-02-12 NOTE — Anesthesia Procedure Notes (Signed)
Procedure Name: Intubation Date/Time: 02/12/2022 7:35 AM  Performed by: Justice Rocher, CRNAPre-anesthesia Checklist: Patient identified, Emergency Drugs available, Suction available, Patient being monitored and Timeout performed Patient Re-evaluated:Patient Re-evaluated prior to induction Oxygen Delivery Method: Circle system utilized Preoxygenation: Pre-oxygenation with 100% oxygen Induction Type: IV induction Ventilation: Mask ventilation without difficulty Laryngoscope Size: Mac and 3 Grade View: Grade II Tube type: Oral Tube size: 7.0 mm Number of attempts: 1 Airway Equipment and Method: Stylet and Oral airway Placement Confirmation: ETT inserted through vocal cords under direct vision, positive ETCO2, breath sounds checked- equal and bilateral and CO2 detector Secured at: 21 cm Tube secured with: Tape Dental Injury: Teeth and Oropharynx as per pre-operative assessment

## 2022-02-12 NOTE — Brief Op Note (Signed)
02/12/2022  11:10 AM  PATIENT:  Kendra Stein  46 y.o. female  PRE-OPERATIVE DIAGNOSIS:  Severe Dysmenorrhea, Hx endometrial ablation. Previous Cesarean section times two  POST-OPERATIVE DIAGNOSIS:  Severe Dysmenorrhea, Hx endometrial ablation;  Previous Cesarean section times two, Severe abdominopelvic adhesions  PROCEDURE:  DaVinci robotic total hysterectomy, bilateral salpingectomy, extensive adhesiolysis( 1hr)  SURGEON:  Surgeon(s) and Role:    * Servando Salina, MD - Primary  PHYSICIAN ASSISTANT:   ASSISTANTS: Gaylord Shih RNFA   ANESTHESIA:   general FINDINGS;   left anterior mid abdominal wall with omental adhesion extending into midline, bilateral adnexal adhesions , anterior uterine serosal adhesions and bladder adhesions to anterior uterus and C/S scar site, nl liver edge. , nl ovaries, tubes with prior evidence of surgical interruption , nl posterior cul de sac EBL:  10 mL   BLOOD ADMINISTERED:none  DRAINS: none   LOCAL MEDICATIONS USED:  MARCAINE     SPECIMEN:  Source of Specimen:  uterus with cervix, tubes  DISPOSITION OF SPECIMEN:  PATHOLOGY  COUNTS:  YES  TOURNIQUET:  * No tourniquets in log *  DICTATION: .Other Dictation: Dictation Number [id number]  PLAN OF CARE: Admit for overnight observation  PATIENT DISPOSITION:  PACU - hemodynamically stable.   Delay start of Pharmacological VTE agent (>24hrs) due to surgical blood loss or risk of bleeding: no

## 2022-02-12 NOTE — H&P (Signed)
Kendra Stein is an 46 y.o. female. [id number] DBF hx TL , endometrial ablation  presents for daVinci robotic total hysterectomy, bilateral salpingectomy due to severe cyclical pelvic pain  Pertinent Gynecological History: Menses:  ablation Bleeding: n/a Contraception: tubal ligation DES exposure: denies Blood transfusions: none Sexually transmitted diseases: no past history Previous GYN Procedures:  TL, endometrial ablation   Last mammogram:  normal  Date: 2023 Last pap: normal Date: 2023 OB History: G3, P2   Menstrual History: Menarche age: n/a No LMP recorded. Patient has had an ablation.    Past Medical History:  Diagnosis Date   Abnormal Pap smear    Arthritis 2020   lower back pain   Cervical polyp    Complication of anesthesia 02/03/2022   Patient states that she sometimes takes longer to wake up. She states that she experiences chills and shaking after anesthesia.   Depression    resolved as of 02/03/22   Migraines    Takes Maxalt as needed per pt on 02/03/22.   Morbid obesity ([place])    Prediabetes    Seizures ([place])    Patient stated that after having toxemia with pregnancy in 2002 she had episodes of dizziness and blacking out. She states that she went to a neurologist and was told she was having some type of seizure. She took meds for a short while. As of 02/03/22, pt states that she has not had a seizure since 2002 and she hasn't taken any seizure meds in years. She does not recall the neurologist or meds.   Toxemia in pregnancy    Umbilical hernia    SOME ABDOMINAL DISCOMFORT, s/p hernia repair   UTI (urinary tract infection)    ON MEDICATION AS OF 6/10 FOR UTI - HX OF FREQUENT UTI'S   Vitamin D deficiency     Past Surgical History:  Procedure Laterality Date   CESAREAN SECTION     1993 and 2002   CRYOTHERAPY  1993   cervix   ENDOMETRIAL ABLATION     around 2016   TUBAL LIGATION  9326   UMBILICAL HERNIA REPAIR N/A 12/27/2013   Procedure: HERNIA REPAIR  UMBILICAL ADULT;  Surgeon: Harl Bowie, MD;  Location: [facility];  Service: General;  Laterality: N/A;   WISDOM TOOTH EXTRACTION  2007    Family History  Problem Relation Age of Onset   Hypertension Mother    Arthritis Mother    Hypertension Brother    Asthma Daughter    Breast cancer Maternal Aunt        around 70   Breast cancer Paternal Aunt    Anesthesia problems Neg Hx    Hypotension Neg Hx    Malignant hyperthermia Neg Hx    Pseudochol deficiency Neg Hx     Social History:  reports that she has never smoked. She has never used smokeless tobacco. She reports current alcohol use. She reports that she does not use drugs.  Allergies: No Known Allergies  Medications Prior to Admission  Medication Sig Dispense Refill Last Dose   Bacillus Coagulans-Inulin (PROBIOTIC FORMULA PO) Take by mouth. Olly for gut.      metroNIDAZOLE (FLAGYL) 500 MG tablet Take 500 mg by mouth 3 (three) times daily.      Naproxen Sodium (ALEVE PO) Take by mouth as needed.      Probiotic Product (UP4 PROBIOTICS ADULT PO) Take by mouth. Prebiotic and Probiotic w/ cranberry from [employer].      Semaglutide-Weight Management (WEGOVY) 2.4  MG/0.75ML SOAJ Inject 2.4 mg into the skin once a week.      Cholecalciferol (VITAMIN D3) 125 MCG (5000 UT) CAPS Take 1 capsule (5,000 Units total) by mouth daily. 90 capsule 3    hydrochlorothiazide (MICROZIDE) 12.5 MG capsule TAKE ONE CAPSULE BY MOUTH DAILY 90 capsule 2    ibuprofen (ADVIL,MOTRIN) 200 MG tablet Take 600 mg by mouth every 6 (six) hours as needed for mild pain.      rizatriptan (MAXALT) 10 MG tablet Take 1 tablet (10 mg total) by mouth once as needed for migraine. May repeat in 2 hours if needed 30 tablet 2    rosuvastatin (CRESTOR) 5 MG tablet TAKE ONE TABLET BY MOUTH DAILY 90 tablet 2     Review of Systems  All other systems reviewed and are negative.   Height '5\' 2"'$  (1.575 m), weight 91.6 kg. Physical Exam Constitutional:      Appearance:  Normal appearance.  HENT:     Head: Atraumatic.  Eyes:     Extraocular Movements: Extraocular movements intact.  Cardiovascular:     Rate and Rhythm: Regular rhythm.     Heart sounds: Normal heart sounds.  Pulmonary:     Breath sounds: Normal breath sounds.  Abdominal:     Palpations: Abdomen is soft.     Comments: Pfannensteil skin incision  Genitourinary:    General: Normal vulva.     Comments: Vagina nl  Cervix nl  Uterus AV bulky Adnexa no palp mass Musculoskeletal:        General: Normal range of motion.     Cervical back: Neck supple.  Skin:    General: Skin is dry.  Neurological:     General: No focal deficit present.     Mental Status: She is alert and oriented to person, place, and time.  Psychiatric:        Mood and Affect: Mood normal.        Behavior: Behavior normal.     Results for orders placed or performed during the hospital encounter of 02/12/22 (from the past 24 hour(s))  Pregnancy, urine POC     Status: None   Collection Time: 02/12/22  6:20 AM  Result Value Ref Range   Preg Test, Ur NEGATIVE NEGATIVE    No results found.  Assessment/Plan: Cyclic pelvic pain Hx endometrial ablation P) davinci robotic total hysterectomy, bilateral salpingectomy Procedure explained. Risk of surgery reviewed including infection, bleeding , injury to surrounding organ structures, poss need for blood transfusion and its risk, thermal injury, poss need for open route, possible need for surgery on ovary in the future due to disease including cancer. Pro and con of removal of ovaries discussed. All ? answered  Christin Moline A Byron Peacock 02/12/2022, 6:46 AM

## 2022-02-12 NOTE — Op Note (Signed)
NAME: Kendra Stein, Kendra Stein MEDICAL RECORD NO: [id number] ACCOUNT NO: [id number] DATE OF BIRTH: 06/12/1976 FACILITY: [facility] LOCATION: [facility] PHYSICIAN: Burris Matherne A. Garwin Brothers, MD  Operative Report   DATE OF PROCEDURE: 02/12/2022  PREOPERATIVE DIAGNOSES:  Severe dysmenorrhea/cyclical pelvic pain, history of endometrial ablation, previous cesarean section x 2.  PROCEDURE:  Da Vinci robotic total hysterectomy, bilateral salpingectomy, extensive adhesiolysis( 1hr).  POSTOPERATIVE DIAGNOSES:  Severe dysmenorrhea/pelvic pain, history of endometrial ablation, previous cesarean section x 2, severe abdominopelvic adhesions.  ANESTHESIA:  General.  SURGEON:  Servando Salina, MD  ASSISTANT:  Gaylord Shih, RNFA.  DESCRIPTION OF PROCEDURE:  Under adequate general anesthesia, the patient was placed in the dorsal lithotomy position.  She was positioned for robotic surgery.  Examination under anesthesia, had revealed anteverted/anteflexed uterus, no adnexal masses  could be appreciated.  The patient was sterilely prepped and draped in the usual fashion.  A 3-way Foley catheter was sterilely placed, draining Pyridium-stained urine.  Weighted speculum was placed in the vagina.  Sims retractor was placed anteriorly.   The cervix was dilated to #21 [facility] dilator.  The uterus sounded to 7 cm.  Anterior and posterior lip of the cervix had a 0 Vicryl figure-of-eight sutures placed on the anterior and posterior lip of the cervix.  A #6 uterine manipulator with a small RUMI KOH  cup was placed and the retractors were removed.  Attention was then turned to the abdomen.  The patient had a prior umbilical hernia repair.  Therefore, 0.25% Marcaine was injected supraumbilically and a vertical incision was made.   Veress needle was introduced and tested.  Opening pressure of 7 was noted and 3.4 liters of CO2 was insufflated.  Veress needle was then removed.  A 8 mm robotic trocar was introduced, the robotic camera  was then inserted. Initially there was evidence of  adhesions anteriorly; however, a small window in the adhesion was noted and the robotic camera was able to be introduced through that area of the opening.  Once that was done, it was then noted that the patient had omental adhesions to the upper left  anterior abdominal wall with inability to be able to place any ports on the left side. To the right mid abdomen, there was a free space, at which time, decision was then made to place the right-sided   robotic ports, 8-9 cm apart.  The markings were done.  0.25%  Marcaine was injected in each of those sites and a small incision was made and under direct visualization, two robotic ports were then placed on the right side. Using those ports on the right, the LigaSure was obtained and placed in those lateral ports  and the procedure was started with using the LigaSure systematically clamped, cauterized and cut the omental adhesions from the anterior abdominal wall until the supraumbilical port was then finally able to be seen surrounded by omental adhesions through which it had traversed. Still in order to see, those adhesions surrounding that port was also lysed using the Ligasure apparatus.  Once these adhesions were all lysed, the left side could be visualized for placement of left robotic port as well as the  8 mm Air Seal. Subsequently, the LigaSure was removed.  The far left port site was identified and the AirSeal was placed in between, so on the left was an 8 mm robotic port placement followed by the 8 mm AirSeal placed in between the camera port and that left robotic port.  Once that was done, the robot  was docked and in arm #1 was the vessel sealer, in arm #3, the bipolar cautery and in arm 4, the monopolar scissors.  I then went to the surgical console.  The patient had been placed in deep Trendelenburg position.  Panoramic inspection of the pelvis  then revealed  more adhesions from the adnexa to the  bowel as well as significant adhesions of the bladder to the uterus due to the patient's prior cesarean sections.  There was surrounding adhesions on both tubes and ovaries.  The patient also has had a tubal  ligation.  The procedure at that point was therefore started with additional lysis of the adhesions of the bladder to the anterior serosal surface of the uterus with the bladder area puckered into the lower segment from the prior cesarean section part.   Once all the adhesions were freed up, the bladder peritoneum was opened transversely and the bladder sharply dissected off the RUMI cup in the lower segment.  The adhesions surrounding to the bowels were lysed on both sides, freeing up the adnexa.  The  adhesions around the adnexa and the fallopian tubes were also lysed, restoring the anatomy. The left fallopian tube was wrapped around the left ovary, which was then taken off of that and freeing up both tubes. Overall time for removal of all adhesions was over an hour. The ureters were both then identified, the  right much more so than the left and peristalsing.  The procedure again was then continued with the left tube being grasped.  The mesosalpinx being serially clamped, cauterized and cut until the tube was removed. That tube had a distal paratubal cyst,  which had to be reduced, in order for the tube to be able to be removed.  The left retroperitoneal space was then opened.  A window was placed in the posterior leaf of the broad ligament.  The left uteroovarian ligament were then serially clamped,  cauterized and cut.  The rest the posterior leaf of the broad ligament was then further lysed, opened and displaced inferiorly.  The left round ligament was then clamped, cauterized and cut and then the anterior leaf of the broad ligament was opened  there and carried further.  The uterine vessels on the left were then identified, skeletonized and then the uterine vessels on the left were then clamped,  cauterized and cut.  The bladder was further displaced inferiorly.  On the opposite side, the right  round ligament was shortened by adhesions.  The right fallopian tube was grasped.  The mesosalpinx was serially clamped, cauterized and cut and the tube was removed.  The right retroperitoneal space was then opened.  A small window was placed in the  posterior leaf of the broad ligament.  The right uteroovarian ligament was then serially clamped, cauterized and then cut. The right posterior leaf of the broad ligament was continued to be sharply opened and displaced inferiorly.  The round ligament was  clamped, cauterized and then cut and the anterior leaf of the broad ligament was opened and carried over to the vesicouterine peritoneum to complete that area with the bladder again being sharply dissected off the lower uterine segment and displaced  inferiorly.  The right uterine vessels were then clamped, cauterized and then cut.  The vaginal insufflator was then used.  The bladder again was inspected.  Further dissection was then performed to displace the bladder further inferiorly.  The  cervicovaginal junction at the upper part of the RUMI cup was  identified and opened with cautery and circumferentially, the cervix was detached from the vagina, from its vaginal attachment.  Once this was done, the uterus was then removed through the  vagina and the insufflator replaced. With the marked adhesions, the anterior cuff was somewhat retracted.  The bladder was retrofilled in order to further delineate its location and the bladder was then further sharply dissected off the vaginal cuff  anteriorly and displaced inferiorly.  Once this was comfortably done, the vessel sealer was replaced by the long tip forceps. The scissors was replaced with the large mega suture needle driver and 0 V-Loc suture was then placed.  The vaginal cuff was  then closed in a single running stitch of V-Loc with subsequently being cut.  The  vaginal cuff was digitally inspected intraoperatively with good approximation noted.  The pelvis was irrigated and suctioned.  The pressure was decreased to 8.  The pelvis  was inspected for any bleeders.  The adnexa was with very good hemostasis.  Vaginal cuff was well approximated and at that point, the procedure was felt to be complete.  Arista potato starch was placed over the vaginal cuff.  The bladder had been  deflated.  The robotic instruments then removed.  The robot was undocked.  I then went back to the patient's bedside sterilely.  Abdomen was desufflated and the robotic ports were removed.  The AirSeal was removed last and the patient was taken out of  Trendelenburg position and the incision were closed with 4-0 Vicryl subcuticular closures.  After hemostasis was achieved with cautery.  I reinspected the vaginal cuff, both with a speculum as well as digital with good approximation noted.  SPECIMEN:  Uterus with cervix and fallopian tubes sent to pathology.  INTRAOPERATIVE FLUIDS:  1700 mL.  ESTIMATED BLOOD LOSS:  About 10 mL.  URINE OUTPUT:  250 mL.  COMPLICATIONS:  None.  CONDITION:  The patient tolerated the procedure well and was transferred to recovery room in stable condition.   MUK D: 02/12/2022 7:58:43 pm T: 02/12/2022 10:30:00 pm  JOB: [id number]/ [id number]

## 2022-02-12 NOTE — Anesthesia Postprocedure Evaluation (Signed)
Anesthesia Post Note  Patient: Kendra Stein  Procedure(s) Performed: XI ROBOTIC ASSISTED LAPAROSCOPIC HYSTERECTOMY AND SALPINGECTOMY, LYSIS OF ADHESIONS (Abdomen)     Patient location during evaluation: PACU Anesthesia Type: General Level of consciousness: awake Pain management: pain level controlled Vital Signs Assessment: post-procedure vital signs reviewed and stable Respiratory status: spontaneous breathing, nonlabored ventilation, respiratory function stable and patient connected to nasal cannula oxygen Cardiovascular status: blood pressure returned to baseline and stable Postop Assessment: no apparent nausea or vomiting Anesthetic complications: no   No notable events documented.  Last Vitals:  Vitals:   02/12/22 1230 02/12/22 1330  BP: 108/72 104/70  Pulse: 79 78  Resp: 18 16  Temp: 36.5 C (!) 36.3 C  SpO2: 100% 98%    Last Pain:  Vitals:   02/12/22 1330  TempSrc:   PainSc: 0-No pain                 Derrik Mceachern P Compton Brigance

## 2022-02-12 NOTE — Interval H&P Note (Signed)
History and Physical Interval Note:  02/12/2022 7:18 AM  Kendra Stein  has presented today for surgery, with the diagnosis of Severe Dysmynorrhea, Hx endometrial ablation.  The various methods of treatment have been discussed with the patient and family. After consideration of risks, benefits and other options for treatment, the patient has consented to  Procedure(s): XI ROBOTIC ASSISTED LAPAROSCOPIC HYSTERECTOMY AND SALPINGECTOMY (N/A) as a surgical intervention.  The patient's history has been reviewed, patient examined, no change in status, stable for surgery.  I have reviewed the patient's chart and labs.  Questions were answered to the patient's satisfaction.     Franciso Dierks A Jaiyden Laur

## 2022-02-12 NOTE — Transfer of Care (Signed)
Immediate Anesthesia Transfer of Care Note  Patient: Kendra Stein  Procedure(s) Performed: Procedure(s) (LRB): XI ROBOTIC ASSISTED LAPAROSCOPIC HYSTERECTOMY AND SALPINGECTOMY, LYSIS OF ADHESIONS (N/A)  Patient Location: PACU  Anesthesia Type: General  Level of Consciousness: awake, sedated, patient cooperative and responds to stimulation  Airway & Oxygen Therapy: Patient Spontanous Breathing and Patient connected to [place] 02 oxygen  Post-op Assessment: Report given to PACU RN, Post -op Vital signs reviewed and stable and Patient moving all extremities  Post vital signs: Reviewed and stable  Complications: No apparent anesthesia complications

## 2022-02-13 DIAGNOSIS — N946 Dysmenorrhea, unspecified: Secondary | ICD-10-CM | POA: Diagnosis not present

## 2022-02-13 LAB — BASIC METABOLIC PANEL
Anion gap: 5 (ref 5–15)
BUN: 12 mg/dL (ref 6–20)
CO2: 26 mmol/L (ref 22–32)
Calcium: 8.3 mg/dL — ABNORMAL LOW (ref 8.9–10.3)
Chloride: 107 mmol/L (ref 98–111)
Creatinine, Ser: 0.82 mg/dL (ref 0.44–1.00)
GFR, Estimated: >60 mL/min (ref >60)
Glucose, Bld: 124 mg/dL — ABNORMAL HIGH (ref 70–99)
Potassium: 3.4 mmol/L — ABNORMAL LOW (ref 3.5–5.1)
Sodium: 138 mmol/L (ref 135–145)

## 2022-02-13 LAB — CBC
HCT: 31.8 % — ABNORMAL LOW (ref 36.0–46.0)
Hemoglobin: 10.6 g/dL — ABNORMAL LOW (ref 12.0–15.0)
MCH: 27.2 pg (ref 26.0–34.0)
MCHC: 33.3 g/dL (ref 30.0–36.0)
MCV: 81.5 fL (ref 80.0–100.0)
Platelets: 262 10*3/uL (ref 150–400)
RBC: 3.9 MIL/uL (ref 3.87–5.11)
RDW: 13.2 % (ref 11.5–15.5)
WBC: 14.8 10*3/uL — ABNORMAL HIGH (ref 4.0–10.5)
nRBC: 0 % (ref 0.0–0.2)

## 2022-02-13 MED ORDER — OXYCODONE HCL 5 MG PO TABS
ORAL_TABLET | ORAL | Status: AC
  Filled 2022-02-13: qty 1

## 2022-02-13 MED ORDER — KETOROLAC TROMETHAMINE 30 MG/ML IJ SOLN
INTRAMUSCULAR | Status: AC
  Filled 2022-02-13: qty 1

## 2022-02-13 MED ORDER — IBUPROFEN 600 MG PO TABS: 600.0000 mg | ORAL_TABLET | Freq: Four times a day (QID) | ORAL | 11 refills | Status: AC | PRN

## 2022-02-13 MED ORDER — POTASSIUM CHLORIDE CRYS ER 20 MEQ PO TBCR: 40.0000 meq | EXTENDED_RELEASE_TABLET | Freq: Two times a day (BID) | ORAL | 0 refills | Status: AC

## 2022-02-13 MED ORDER — OXYCODONE HCL 5 MG PO TABS
ORAL_TABLET | ORAL | Status: AC
  Filled 2022-02-13: qty 2

## 2022-02-13 MED ORDER — SIMETHICONE 80 MG PO CHEW
CHEWABLE_TABLET | ORAL | Status: AC
  Filled 2022-02-13: qty 1

## 2022-02-13 MED ORDER — SENNA 8.6 MG PO TABS
ORAL_TABLET | ORAL | Status: AC
  Filled 2022-02-13: qty 1

## 2022-02-13 MED ORDER — OXYCODONE HCL 5 MG PO TABS: 5.0000 mg | ORAL_TABLET | Freq: Four times a day (QID) | ORAL | 0 refills | Status: AC | PRN

## 2022-02-13 NOTE — Discharge Summary (Signed)
Physician Discharge Summary  Patient ID: Kendra Stein MRN: [id number] DOB/AGE: March 27, 1976 46 y.o.  Admit date: 02/12/2022 Discharge date: 02/13/2022  Admission Diagnoses: severe dysmenorrhea/cyclical pelvic pain, hx endometrial ablation, previous cesarean section x two, chronic HTN  Discharge Diagnoses: same, hypokalemia, chronic HTN Principal Problem:   Dysmenorrhea Active Problems:   S/P hysterectomy   Discharged Condition: stable  Hospital Course: pt underwent DaVinci robotic total hysterectomy, bilateral salpingectomy, extensive adhesiolysis. Postoperative course Notable for hypotension asymptomatic and hypokalemia. Pt otherwise did well. Tolerated regular diet and min pain  Consults: None  Significant Diagnostic Studies: labs:     Latest Ref Rng & Units 02/13/2022    2:22 AM 02/10/2022    2:56 PM 04/22/2020    2:30 PM  CBC  WBC 4.0 - 10.5 K/uL 14.8  10.6  9.4   Hemoglobin 12.0 - 15.0 g/dL 10.6  12.5  12.5   Hematocrit 36.0 - 46.0 % 31.8  37.0  37.9   Platelets 150 - 400 K/uL 262  298  312        Latest Ref Rng & Units 02/13/2022    2:22 AM 02/10/2022    2:56 PM 04/22/2020    2:30 PM  BMP  Glucose 70 - 99 mg/dL 124  99  76   BUN 6 - 20 mg/dL '12  11  10   '$ Creatinine 0.44 - 1.00 mg/dL 0.82  0.68  0.84   BUN/Creat Ratio 6 - 22 (calc)   NOT APPLICABLE   Sodium 008 - 145 mmol/L 138  137  138   Potassium 3.5 - 5.1 mmol/L 3.4  3.1  3.3   Chloride 98 - 111 mmol/L 107  104  101   CO2 22 - 32 mmol/L '26  26  29   '$ Calcium 8.9 - 10.3 mg/dL 8.3  9.2  9.2      Treatments: surgery: DaVinci robotic total hysterectomy, bilateral salpingectomy, extensive adhesiolysis  Discharge Exam: Blood pressure (!) 92/59, pulse 72, temperature 98 F (36.7 C), resp. rate (!) 22, height '5\' 2"'$  (1.575 m), weight 92.1 kg, SpO2 98 %. General appearance: alert, cooperative, and no distress Back: no tenderness to percussion or palpation Resp: clear to auscultation bilaterally Cardio: regular rate  and rhythm, S1, S2 normal, no murmur, click, rub or gallop GI: soft, non-tender; bowel sounds normal; no masses,  no organomegaly Pelvic: deferred Extremities: no edema, redness or tenderness in the calves or thighs Incision/Wound:  Disposition: Discharge disposition: 01-Home or Self Care       Discharge Instructions     Call MD for:  persistant dizziness or light-headedness   Complete by: As directed    Call MD for:  persistant nausea and vomiting   Complete by: As directed    Call MD for:  severe uncontrolled pain   Complete by: As directed    Call MD for:  temperature >100.4   Complete by: As directed    Diet - low sodium heart healthy   Complete by: As directed    May walk up steps   Complete by: As directed    No wound care   Complete by: As directed       Allergies as of 02/13/2022       Reactions   Shellfish Allergy Itching   Mild throat itching; able to use betadine        Medication List     STOP taking these medications    ALEVE PO   metroNIDAZOLE 500 MG tablet  Commonly known as: FLAGYL       TAKE these medications    hydrochlorothiazide 12.5 MG capsule Commonly known as: MICROZIDE TAKE ONE CAPSULE BY MOUTH DAILY   ibuprofen 600 MG tablet Commonly known as: ADVIL Take 1 tablet (600 mg total) by mouth every 6 (six) hours as needed. What changed:  medication strength reasons to take this   oxyCODONE 5 MG immediate release tablet Commonly known as: Oxy IR/ROXICODONE Take 1-2 tablets (5-10 mg total) by mouth every 6 (six) hours as needed for up to 7 days for moderate pain.   potassium chloride SA 20 MEQ tablet Commonly known as: KLOR-CON M Take 2 tablets (40 mEq total) by mouth 2 (two) times daily for 3 days.   PROBIOTIC FORMULA PO Take by mouth. Olly for gut.   rizatriptan 10 MG tablet Commonly known as: Maxalt Take 1 tablet (10 mg total) by mouth once as needed for migraine. May repeat in 2 hours if needed   rosuvastatin 5 MG  tablet Commonly known as: CRESTOR TAKE ONE TABLET BY MOUTH DAILY   UP4 PROBIOTICS ADULT PO Take by mouth. Prebiotic and Probiotic w/ cranberry from [employer].   Vitamin D3 125 MCG (5000 UT) Caps Take 1 capsule (5,000 Units total) by mouth daily.   Wegovy 2.4 MG/0.75ML Soaj Generic drug: Semaglutide-Weight Management Inject 2.4 mg into the skin once a week.        Follow-up Information     Servando Salina, MD Follow up in 2 week(s).   Specialty: Obstetrics and Gynecology Contact information: [address] [place] [address] [phone]                 Signed: Marvene Staff 02/13/2022, 9:30 AM

## 2022-02-13 NOTE — Progress Notes (Signed)
Subjective: Patient reports tolerating PO and no problems voiding. Denies back pain. Intraoperative findings reviewed with pt ? Regarding low BP. Pt denies lightheadedness or dizziness   Objective: I have reviewed patient's vital signs.  vital signs, intake and output, and labs. Vitals:   02/13/22 0630 02/13/22 0833  BP: 95/60 (!) 92/59  Pulse: 73 72  Resp: 20 (!) 22  Temp: 97.8 F (36.6 C) 98 F (36.7 C)  SpO2: 98% 98%   I/O last 3 completed shifts: In: 83 [P.O.:1100; I.V.:2700; IV Piggyback:100] Out: 2035 [Urine:2025; Blood:10] Total I/O In: 250 [P.O.:250] Out: -   Lab Results  Component Value Date   WBC 14.8 (H) 02/13/2022   HGB 10.6 (L) 02/13/2022   HCT 31.8 (L) 02/13/2022   MCV 81.5 02/13/2022   PLT 262 02/13/2022   Lab Results  Component Value Date   CREATININE 0.82 02/13/2022    EXAM General: alert, cooperative, and no distress Resp: clear to auscultation bilaterally Cardio: regular rate and rhythm, S1, S2 normal, no murmur, click, rub or gallop GI: soft, non-tender; bowel sounds normal; no masses,  no organomegaly and incision: clean, dry, and intact Extremities: no edema, redness or tenderness in the calves or thighs Vaginal Bleeding: none Back: no CVAT Assessment: Postoperative Day 1 s/p Procedure(s): XI ROBOTIC ASSISTED LAPAROSCOPIC HYSTERECTOMY AND SALPINGECTOMY,  EXTENSIVE LYSIS OF ADHESIONS: stable, progressing well, and tolerating diet Chronic HTN with noted low BP. In the interim hold BP med hypokalemia Plan: Advance to PO medication Discontinue IV fluids Discharge home D/c instructions reviewed Instructed pt to check BP  at home prior to resume  BPmedications K + repletion sent F/u 2 wks Pain med scripts sent  LOS: 0 days    Marvene Staff, MD 02/13/2022 9:23 AM    02/13/2022, 9:23 AM

## 2022-02-15 ENCOUNTER — Encounter (HOSPITAL_BASED_OUTPATIENT_CLINIC_OR_DEPARTMENT_OTHER): Payer: Self-pay | Admitting: Obstetrics and Gynecology

## 2022-02-15 LAB — SURGICAL PATHOLOGY

## 2022-02-24 ENCOUNTER — Ambulatory Visit
Admission: RE | Admit: 2022-02-24 | Discharge: 2022-02-24 | Disposition: A | Discharge status: Home / Self Care | Payer: BC Managed Care – PPO | Source: Ambulatory Visit | Attending: Nurse Practitioner | Admitting: Nurse Practitioner

## 2022-02-24 DIAGNOSIS — Z1231 Encounter for screening mammogram for malignant neoplasm of breast: Secondary | ICD-10-CM

## 2022-02-25 ENCOUNTER — Ambulatory Visit: Payer: BC Managed Care – PPO

## 2022-02-26 ENCOUNTER — Ambulatory Visit: Payer: BC Managed Care – PPO

## 2022-03-23 ENCOUNTER — Encounter: Payer: Self-pay | Admitting: Internal Medicine

## 2022-04-01 ENCOUNTER — Ambulatory Visit (AMBULATORY_SURGERY_CENTER): Payer: Self-pay

## 2022-04-01 VITALS — Ht 62.0 in | Wt 198.0 lb

## 2022-04-01 DIAGNOSIS — Z1211 Encounter for screening for malignant neoplasm of colon: Secondary | ICD-10-CM

## 2022-04-01 MED ORDER — NA SULFATE-K SULFATE-MG SULF 17.5-3.13-1.6 GM/177ML PO SOLN: 1.0000 | ORAL | 0 refills | Status: DC

## 2022-04-01 NOTE — Progress Notes (Signed)
No egg or soy allergy known to patient  No issues known to pt with past sedation with any surgeries or procedures Patient denies ever being told they had issues or difficulty with intubation  No FH of Malignant Hyperthermia Pt is not on diet pills. Kendra Stein is utilized for weight management and will be held 3 days prior to procedure. Pt is not on  home 02  Pt is not on blood thinners  Pt denies issues with constipation  No A fib or A flutter Have any cardiac testing pending--denied Pt instructed to use [email or web address] or GoodRx for a price reduction on prep   PV conducted over phone. Instructions and sample consent mailed to verified address.

## 2022-04-08 ENCOUNTER — Encounter: Payer: Self-pay | Admitting: Internal Medicine

## 2022-04-22 ENCOUNTER — Ambulatory Visit (AMBULATORY_SURGERY_CENTER): Payer: BC Managed Care – PPO | Admitting: Internal Medicine

## 2022-04-22 ENCOUNTER — Encounter: Payer: Self-pay | Admitting: Internal Medicine

## 2022-04-22 VITALS — BP 104/65 | HR 105 | Temp 97.1°F | Resp 13 | Ht 62.0 in | Wt 198.0 lb

## 2022-04-22 DIAGNOSIS — D122 Benign neoplasm of ascending colon: Secondary | ICD-10-CM | POA: Diagnosis not present

## 2022-04-22 DIAGNOSIS — D120 Benign neoplasm of cecum: Secondary | ICD-10-CM | POA: Diagnosis not present

## 2022-04-22 DIAGNOSIS — Z1211 Encounter for screening for malignant neoplasm of colon: Secondary | ICD-10-CM | POA: Diagnosis present

## 2022-04-22 DIAGNOSIS — D125 Benign neoplasm of sigmoid colon: Secondary | ICD-10-CM | POA: Diagnosis not present

## 2022-04-22 MED ORDER — SODIUM CHLORIDE 0.9 % IV SOLN: 500.0000 mL | Freq: Once | INTRAVENOUS | Status: DC

## 2022-04-22 NOTE — Progress Notes (Signed)
GASTROENTEROLOGY PROCEDURE H&P NOTE   Primary Care Physician: Vonna Drafts, FNP    Reason for Procedure:   Colon cancer screening  Plan:    Colonoscopy  Patient is appropriate for endoscopic procedure(s) in the ambulatory ([place]) setting.  The nature of the procedure, as well as the risks, benefits, and alternatives were carefully and thoroughly reviewed with the patient. Ample time for discussion and questions allowed. The patient understood, was satisfied, and agreed to proceed.     HPI: Kendra Stein is a 46 y.o. female who presents for colonoscopy for colon cancer screening. Denies blood in stools, changes in bowel habits, weight loss. Denies family history of colon cancer.  Past Medical History:  Diagnosis Date   Abnormal Pap smear    Arthritis 2020   lower back pain   Cervical polyp    Complication of anesthesia 02/03/2022   Patient states that she sometimes takes longer to wake up. She states that she experiences chills and shaking after anesthesia.   Depression    resolved as of 02/03/22   Migraines    Takes Maxalt as needed per pt on 02/03/22.   Morbid obesity ([place])    Prediabetes    Seizures ([place])    Patient stated that after having toxemia with pregnancy in 2002 she had episodes of dizziness and blacking out. She states that she went to a neurologist and was told she was having some type of seizure. She took meds for a short while. As of 02/03/22, pt states that she has not had a seizure since 2002 and she hasn't taken any seizure meds in years. She does not recall the neurologist or meds.   Toxemia in pregnancy    Umbilical hernia    SOME ABDOMINAL DISCOMFORT, s/p hernia repair   UTI (urinary tract infection)    ON MEDICATION AS OF 6/10 FOR UTI - HX OF FREQUENT UTI'S   Vitamin D deficiency     Past Surgical History:  Procedure Laterality Date   CESAREAN SECTION     1993 and 2002   CRYOTHERAPY  1993   cervix   ENDOMETRIAL ABLATION     around  2016   ROBOTIC ASSISTED LAPAROSCOPIC HYSTERECTOMY AND SALPINGECTOMY N/A 02/12/2022   Procedure: XI ROBOTIC ASSISTED LAPAROSCOPIC HYSTERECTOMY AND SALPINGECTOMY, LYSIS OF ADHESIONS;  Surgeon: Servando Salina, MD;  Location: [facility];  Service: Gynecology;  Laterality: N/A;   TUBAL LIGATION  5465   UMBILICAL HERNIA REPAIR N/A 12/27/2013   Procedure: HERNIA REPAIR UMBILICAL ADULT;  Surgeon: Harl Bowie, MD;  Location: [facility];  Service: General;  Laterality: N/A;   [place] EXTRACTION  2007    Prior to Admission medications   Medication Sig Start Date End Date Taking? Authorizing Provider  Cholecalciferol (VITAMIN D3) 125 MCG (5000 UT) CAPS Take 1 capsule (5,000 Units total) by mouth daily. 10/25/19  Yes McClanahan, Kyra, NP  EC-NAPROXEN 500 MG EC tablet Take 500 mg by mouth 2 (two) times daily as needed. 04/02/22  Yes [provider]  hydrochlorothiazide (MICROZIDE) 12.5 MG capsule TAKE ONE CAPSULE BY MOUTH DAILY 06/18/20  Yes McClanahan, Kyra, NP  rosuvastatin (CRESTOR) 5 MG tablet TAKE ONE TABLET BY MOUTH DAILY 06/18/20  Yes McClanahan, Kyra, NP  Semaglutide-Weight Management (WEGOVY) 2.4 MG/0.75ML SOAJ Inject 2.4 mg into the skin once a week.   Yes [provider]  Adapalene 0.3 % gel     [provider]  Bacillus Coagulans-Inulin (PROBIOTIC FORMULA PO) Take by  mouth. Olly for gut. Patient not taking: Reported on 04/01/2022    [provider]  clindamycin (CLEOCIN T) 1 % lotion     [provider]  furosemide (LASIX) 20 MG tablet TAKE ONE TABLET BY MOUTH DAILY AS NEEDED FOR EDEMA 07/30/21   [provider]  ibuprofen (ADVIL) 600 MG tablet Take 1 tablet (600 mg total) by mouth every 6 (six) hours as needed. 02/13/22   Servando Salina, MD  potassium chloride SA (KLOR-CON M) 20 MEQ tablet Take 2 tablets (40 mEq total) by mouth 2 (two) times daily for 3 days. 02/13/22 02/16/22  Servando Salina, MD  Probiotic Product  (UP4 PROBIOTICS ADULT PO) Take by mouth. Prebiotic and Probiotic w/ cranberry from [employer].    [provider]  rizatriptan (MAXALT) 10 MG tablet Take 1 tablet (10 mg total) by mouth once as needed for migraine. May repeat in 2 hours if needed 08/23/19 08/23/20  Garnet Sierras, NP    Current Outpatient Medications  Medication Sig Dispense Refill   Cholecalciferol (VITAMIN D3) 125 MCG (5000 UT) CAPS Take 1 capsule (5,000 Units total) by mouth daily. 90 capsule 3   EC-NAPROXEN 500 MG EC tablet Take 500 mg by mouth 2 (two) times daily as needed.     hydrochlorothiazide (MICROZIDE) 12.5 MG capsule TAKE ONE CAPSULE BY MOUTH DAILY 90 capsule 2   rosuvastatin (CRESTOR) 5 MG tablet TAKE ONE TABLET BY MOUTH DAILY 90 tablet 2   Semaglutide-Weight Management (WEGOVY) 2.4 MG/0.75ML SOAJ Inject 2.4 mg into the skin once a week.     Adapalene 0.3 % gel      Bacillus Coagulans-Inulin (PROBIOTIC FORMULA PO) Take by mouth. Olly for gut. (Patient not taking: Reported on 04/01/2022)     clindamycin (CLEOCIN T) 1 % lotion      furosemide (LASIX) 20 MG tablet TAKE ONE TABLET BY MOUTH DAILY AS NEEDED FOR EDEMA     ibuprofen (ADVIL) 600 MG tablet Take 1 tablet (600 mg total) by mouth every 6 (six) hours as needed. 30 tablet 11   potassium chloride SA (KLOR-CON M) 20 MEQ tablet Take 2 tablets (40 mEq total) by mouth 2 (two) times daily for 3 days. 12 tablet 0   Probiotic Product (UP4 PROBIOTICS ADULT PO) Take by mouth. Prebiotic and Probiotic w/ cranberry from [employer].     rizatriptan (MAXALT) 10 MG tablet Take 1 tablet (10 mg total) by mouth once as needed for migraine. May repeat in 2 hours if needed 30 tablet 2   Current Facility-Administered Medications  Medication Dose Route Frequency Provider Last Rate Last Admin   0.9 %  sodium chloride infusion  500 mL Intravenous Once Sharyn Creamer, MD        Allergies as of 04/22/2022 - Review Complete 04/22/2022  Allergen Reaction Noted   Shellfish  allergy Itching 02/12/2022    Family History  Problem Relation Age of Onset   Hypertension Mother    Arthritis Mother    Hypertension Brother    Breast cancer Maternal Aunt        around 14   Breast cancer Paternal Aunt    Asthma Daughter    Anesthesia problems Neg Hx    Hypotension Neg Hx    Malignant hyperthermia Neg Hx    Pseudochol deficiency Neg Hx    Colon cancer Neg Hx    Colon polyps Neg Hx    Esophageal cancer Neg Hx    Stomach cancer Neg Hx  Rectal cancer Neg Hx     Social History   Socioeconomic History   Marital status: Single    Spouse name: Not on file   Number of children: Not on file   Years of education: Not on file   Highest education level: Not on file  Occupational History   Not on file  Tobacco Use   Smoking status: Never   Smokeless tobacco: Never  Vaping Use   Vaping Use: Never used  Substance and Sexual Activity   Alcohol use: Yes    Comment: drinks a drink or two every couple of weeks   Drug use: No   Sexual activity: Not on file    Comment: tubal ligation  Other Topics Concern   Not on file  Social History Narrative   Not on file   Social Determinants of Health   Financial Resource Strain: Not on file  Food Insecurity: Not on file  Transportation Needs: Not on file  Physical Activity: Not on file  Stress: Not on file  Social Connections: Not on file  Intimate Partner Violence: Not on file    Physical Exam: Vital signs in last 24 hours: BP 122/81   Pulse (!) 105   Temp (!) 97.1 F (36.2 C) (Temporal)   Ht '5\' 2"'$  (1.575 m)   Wt 198 lb (89.8 kg)   SpO2 98%   BMI 36.21 kg/m  GEN: NAD EYE: Sclerae anicteric ENT: MMM CV: Non-tachycardic Pulm: No increased work of breathing GI: Soft, NT/ND NEURO:  Alert & Oriented   Christia Reading, MD [facility]  04/22/2022 8:09 AM

## 2022-04-22 NOTE — Patient Instructions (Signed)
Resume previous diet and medications. Awaiting pathology results. Repeat Colonoscopy date to be determined based on pathology results.  YOU HAD AN ENDOSCOPIC PROCEDURE TODAY AT THE [facility]:   Refer to the procedure report that was given to you for any specific questions about what was found during the examination.  If the procedure report does not answer your questions, please call your gastroenterologist to clarify.  If you requested that your care partner not be given the details of your procedure findings, then the procedure report has been included in a sealed envelope for you to review at your convenience later.  YOU SHOULD EXPECT: Some feelings of bloating in the abdomen. Passage of more gas than usual.  Walking can help get rid of the air that was put into your GI tract during the procedure and reduce the bloating. If you had a lower endoscopy (such as a colonoscopy or flexible sigmoidoscopy) you may notice spotting of blood in your stool or on the toilet paper. If you underwent a bowel prep for your procedure, you may not have a normal bowel movement for a few days.  Please Note:  You might notice some irritation and congestion in your nose or some drainage.  This is from the oxygen used during your procedure.  There is no need for concern and it should clear up in a day or so.  SYMPTOMS TO REPORT IMMEDIATELY:  Following lower endoscopy (colonoscopy or flexible sigmoidoscopy):  Excessive amounts of blood in the stool  Significant tenderness or worsening of abdominal pains  Swelling of the abdomen that is new, acute  Fever of 100F or higher   For urgent or emergent issues, a gastroenterologist can be reached at any hour by calling [phone]. Do not use MyChart messaging for urgent concerns.    DIET:  We do recommend a small meal at first, but then you may proceed to your regular diet.  Drink plenty of fluids but you should avoid alcoholic beverages for 24  hours.  ACTIVITY:  You should plan to take it easy for the rest of today and you should NOT DRIVE or use heavy machinery until tomorrow (because of the sedation medicines used during the test).    FOLLOW UP: Our staff will call the number listed on your records the next business day following your procedure.  We will call around 7:15- 8:00 am to check on you and address any questions or concerns that you may have regarding the information given to you following your procedure. If we do not reach you, we will leave a message.     If any biopsies were taken you will be contacted by phone or by letter within the next 1-3 weeks.  Please call us at [phone] if you have not heard about the biopsies in 3 weeks.    SIGNATURES/CONFIDENTIALITY: You and/or your care partner have signed paperwork which will be entered into your electronic medical record.  These signatures attest to the fact that that the information above on your After Visit Summary has been reviewed and is understood.  Full responsibility of the confidentiality of this discharge information lies with you and/or your care-partner. 

## 2022-04-22 NOTE — Progress Notes (Signed)
Sedate, gd SR, tolerated procedure well, VSS, report to RN 

## 2022-04-22 NOTE — Op Note (Signed)
[place] Patient Name: Kendra Stein Procedure Date: 04/22/2022 7:30 AM MRN: [id number] Endoscopist: Sonny Masters "Christia Reading ,  Age: 46 Referring MD:  Date of Birth: [birth year] Gender: Female Account #: [id number] Procedure:                Colonoscopy Indications:              Screening for colorectal malignant neoplasm, This                            is the patient's first colonoscopy Medicines:                Monitored Anesthesia Care Procedure:                Pre-Anesthesia Assessment:                           - Prior to the procedure, a History and Physical                            was performed, and patient medications and                            allergies were reviewed. The patient's tolerance of                            previous anesthesia was also reviewed. The risks                            and benefits of the procedure and the sedation                            options and risks were discussed with the patient.                            All questions were answered, and informed consent                            was obtained. Prior Anticoagulants: The patient has                            taken no previous anticoagulant or antiplatelet                            agents. ASA Grade Assessment: II - A patient with                            mild systemic disease. After reviewing the risks                            and benefits, the patient was deemed in                            satisfactory condition to undergo the procedure.  After obtaining informed consent, the colonoscope                            was passed under direct vision. Throughout the                            procedure, the patient's blood pressure, pulse, and                            oxygen saturations were monitored continuously. The                            Olympus CF-HQ190L [id number]) Colonoscope was                            introduced through  the anus and advanced to the the                            terminal ileum. The colonoscopy was performed                            without difficulty. The patient tolerated the                            procedure well. The quality of the bowel                            preparation was good. The terminal ileum, ileocecal                            valve, appendiceal orifice, and rectum were                            photographed. Scope In: 8:11:34 AM Scope Out: 8:40:03 AM Scope Withdrawal Time: 0 hours 21 minutes 43 seconds  Total Procedure Duration: 0 hours 28 minutes 29 seconds  Findings:                 The terminal ileum appeared normal.                           Five sessile polyps were found in the transverse                            colon, ascending colon and ileocecal valve. The                            polyps were 3 to 10 mm in size. These polyps were                            removed with a cold snare. Resection and retrieval                            were complete.  A 3 mm polyp was found in the sigmoid colon. The                            polyp was sessile. The polyp was removed with a                            cold snare. Resection and retrieval were complete.                           Non-bleeding internal hemorrhoids were found during                            retroflexion. Complications:            No immediate complications. Estimated Blood Loss:     Estimated blood loss was minimal. Impression:               - The examined portion of the ileum was normal.                           - Five 3 to 10 mm polyps in the transverse colon,                            in the ascending colon and at the ileocecal valve,                            removed with a cold snare. Resected and retrieved.                           - One 3 mm polyp in the sigmoid colon, removed with                            a cold snare. Resected and retrieved.                            - Non-bleeding internal hemorrhoids. Recommendation:           - Discharge patient to home (with escort).                           - Await pathology results.                           - The findings and recommendations were discussed                            with the patient. Dr Georgian Co "Lyndee Leo" Lorenso Courier,  04/22/2022 8:49:54 AM

## 2022-04-22 NOTE — Progress Notes (Signed)
Pt's states no medical or surgical changes since previsit or office visit. 

## 2022-04-23 ENCOUNTER — Telehealth: Payer: Self-pay | Admitting: *Deleted

## 2022-04-23 NOTE — Telephone Encounter (Signed)
  Follow up Call-     04/22/2022    7:24 AM  Call back number  Post procedure Call Back phone  # [phone]  Permission to leave phone message Yes     Patient questions:  Do you have a fever, pain , or abdominal swelling? No. Pain Score  0 *  Have you tolerated food without any problems? Yes.    Have you been able to return to your normal activities? Yes.    Do you have any questions about your discharge instructions: Diet   No. Medications  No. Follow up visit  No.  Do you have questions or concerns about your Care? No.  Actions: * If pain score is 4 or above: No action needed, pain <4.

## 2022-04-26 ENCOUNTER — Encounter: Payer: Self-pay | Admitting: Internal Medicine

## 2022-05-07 IMAGING — US US PELVIS COMPLETE WITH TRANSVAGINAL
1 series · 13 of 25 positions shown · non-contrast
Comparison: 11/30/2011

CLINICAL DATA: Cyclic pelvic pain. History of endometrial ablation.
LMP was 8 years ago.

EXAM:
TRANSABDOMINAL AND TRANSVAGINAL ULTRASOUND OF PELVIS
TECHNIQUE: Both transabdominal and transvaginal ultrasound examinations of the
pelvis were performed. Transabdominal technique was performed for
global imaging of the pelvis including uterus, ovaries, adnexal
regions, and pelvic cul-de-sac. It was necessary to proceed with
endovaginal exam following the transabdominal exam to visualize the
endometrium and ovaries.

[Series 1: us pelvis complete with transvaginal · 0.17mm/px · 13 of 56 slices shown]
[im 1/56]
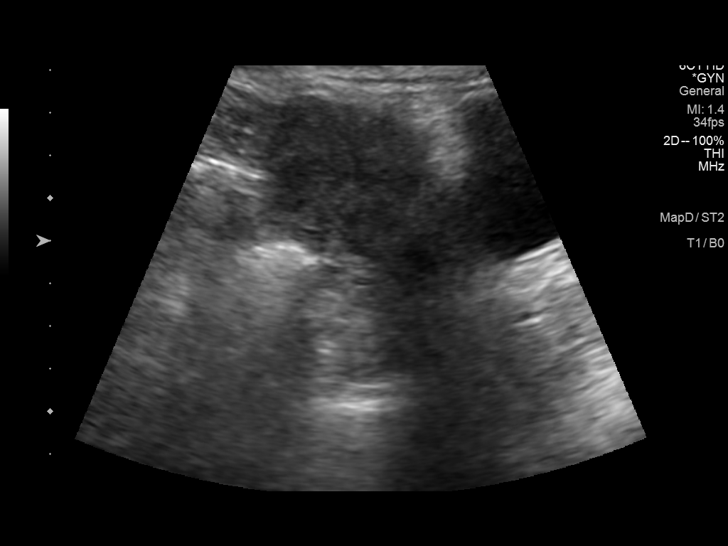
[im 5/56]
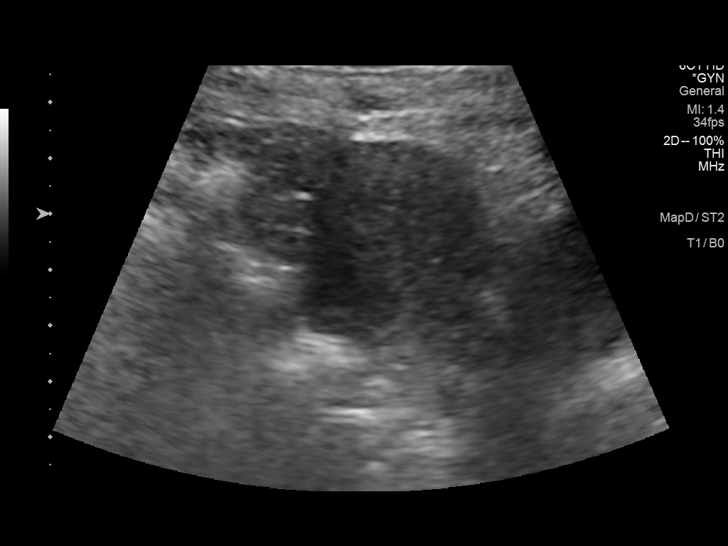
[im 10/56]
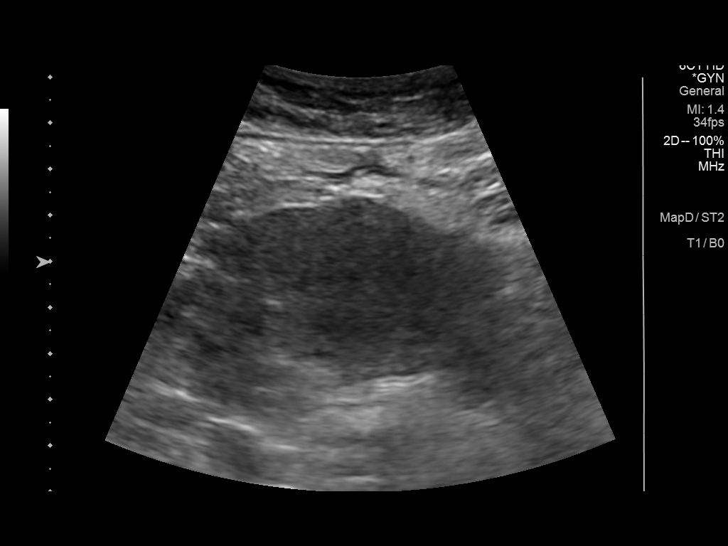
[im 14/56]
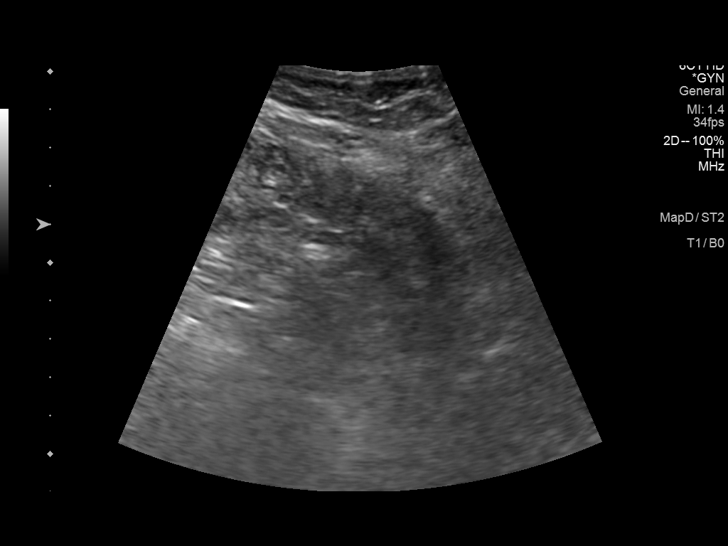
[im 19/56]
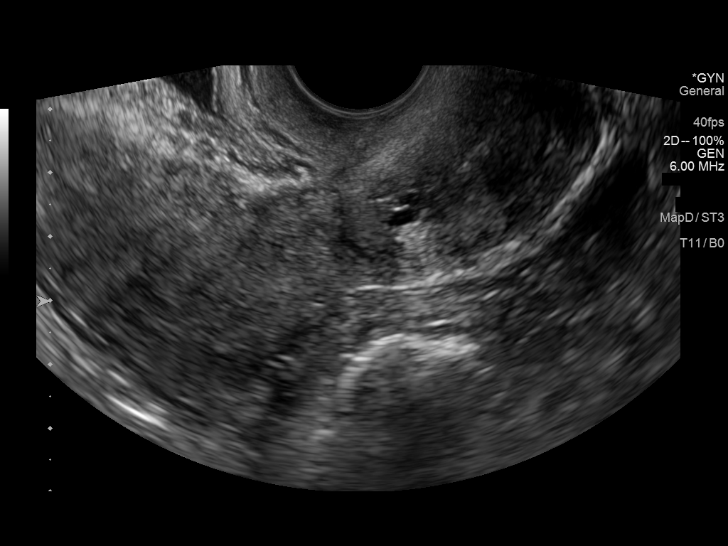
[im 23/56]
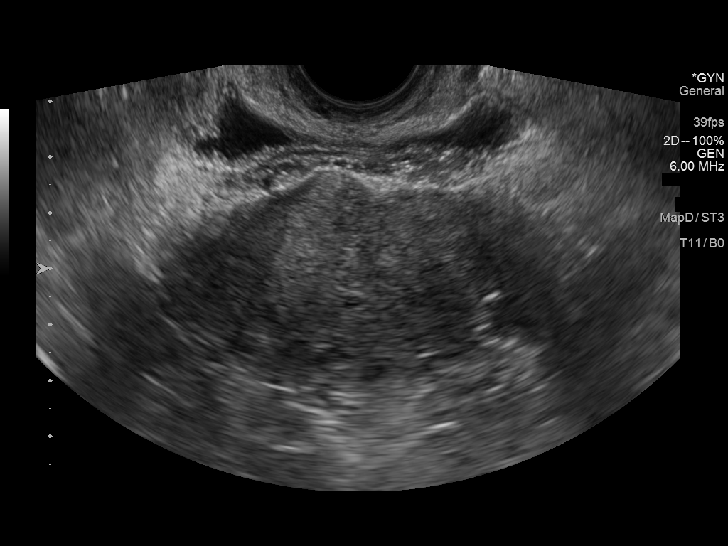
[im 28/56]
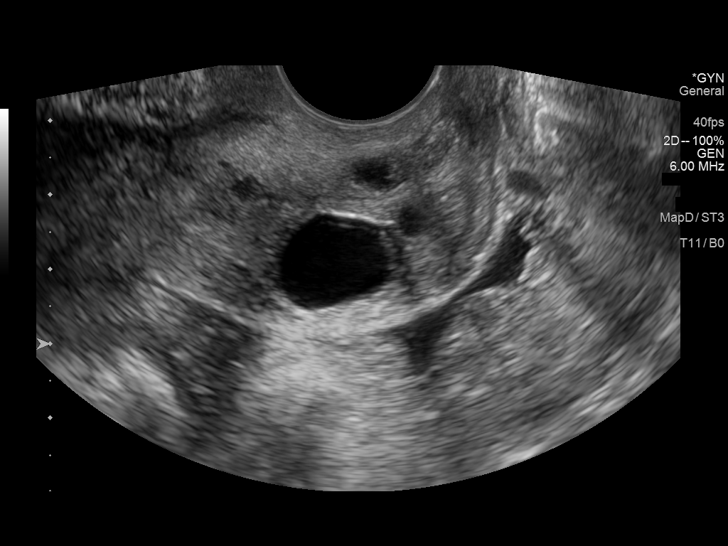
[im 33/56]
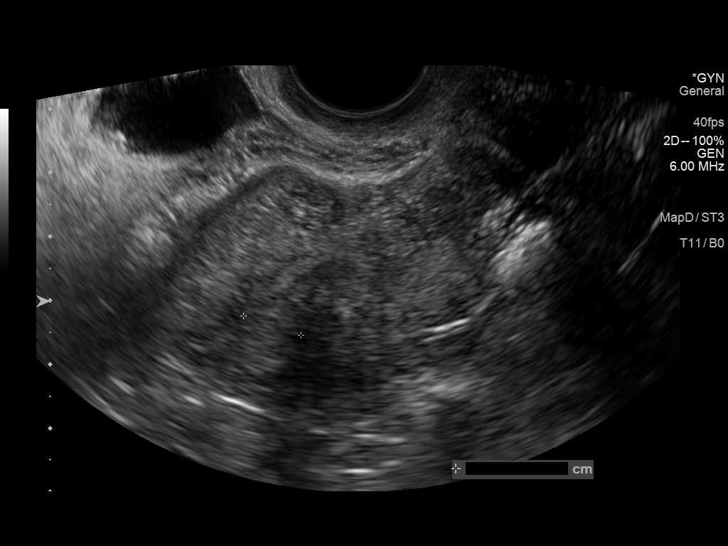
[im 37/56]
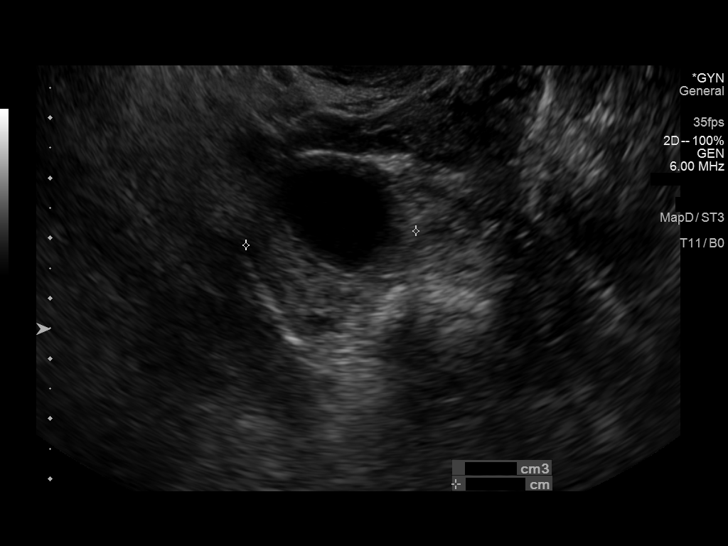
[im 42/56]
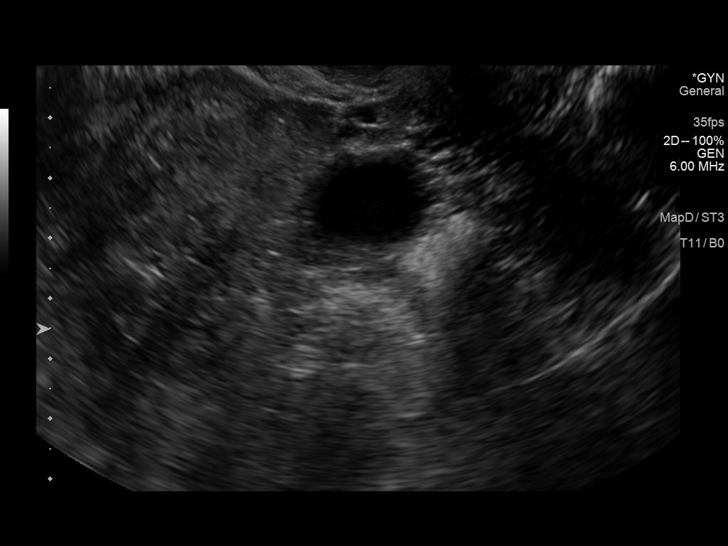
[im 46/56]
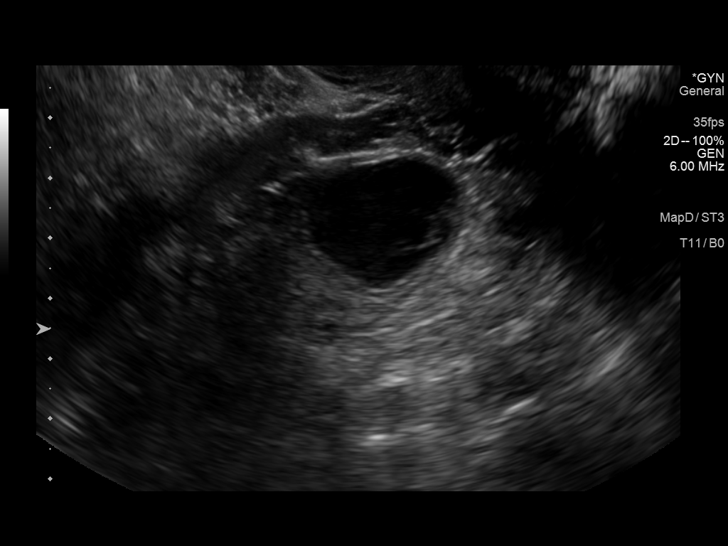
[im 51/56]
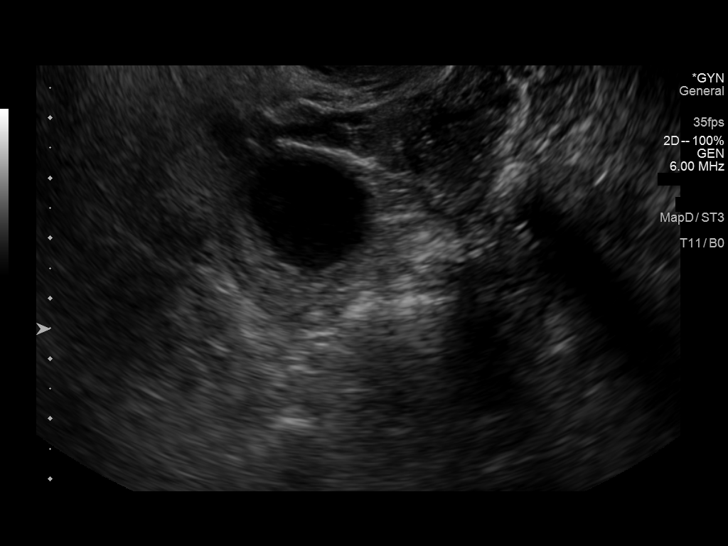
[im 56/56]
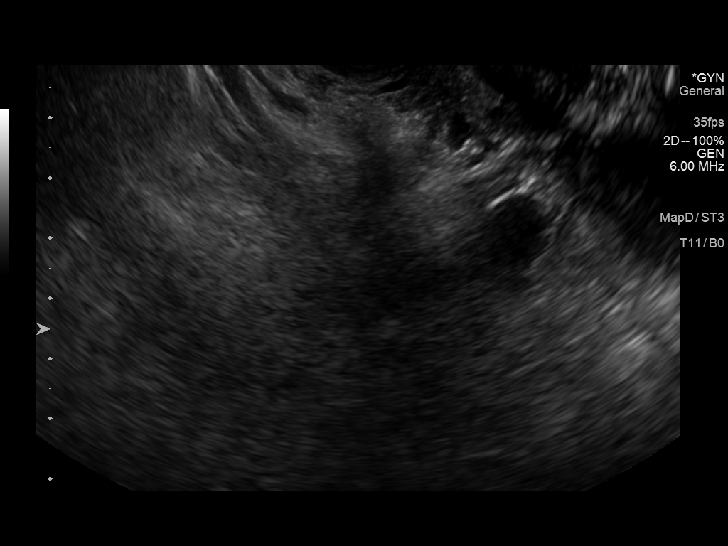

[13 of 25 positions shown; findings below may reference images not displayed]

FINDINGS: Uterus

Measurements: 9.0 x 4.3 x 5.8 centimeters = volume: 108 mL. No
fibroids or other mass visualized. Uterus is anteverted.

Endometrium

Thickness: 9.5 millimeters.  No focal abnormality visualized.

Right ovary

Measurements: 4.0 x 2.8 x 2.8 centimeters = volume: 16.7 mL. A
cystic lesion in the ovary is 2.5 x 2.1 x 2.9 centimeters. Along the
inferior wall of the cyst, there is a 4 millimeter solid component.

Left ovary

Measurements: LEFT ovary is not visualized.

Other findings

No free pelvic fluid.
IMPRESSION: 1. Anteverted uterus normal in size.
2. Endometrial thickness is greater than expected given the history
of endometrial ablation. However, no focal endometrial abnormality
identified.
3. 2.9 centimeter RIGHT ovarian cyst with 4 millimeter mural nodule.
Follow-up ultrasound is recommended in 8-12 weeks.
4. Nonvisualized LEFT ovary.

## 2022-05-19 NOTE — Telephone Encounter (Signed)
Entered in error

## 2022-05-24 ENCOUNTER — Other Ambulatory Visit: Payer: Self-pay | Admitting: General Surgery

## 2022-05-24 DIAGNOSIS — R1905 Periumbilic swelling, mass or lump: Secondary | ICD-10-CM

## 2022-06-08 ENCOUNTER — Ambulatory Visit
Admission: RE | Admit: 2022-06-08 | Discharge: 2022-06-08 | Disposition: A | Discharge status: Home / Self Care | Payer: BC Managed Care – PPO | Source: Ambulatory Visit | Attending: General Surgery | Admitting: General Surgery

## 2022-06-08 DIAGNOSIS — R1905 Periumbilic swelling, mass or lump: Secondary | ICD-10-CM

## 2022-06-08 MED ORDER — IOPAMIDOL (ISOVUE-300) INJECTION 61%
100.0000 mL | Freq: Once | INTRAVENOUS | Status: AC | PRN
  Administered 2022-06-08: 100 mL via INTRAVENOUS

## 2022-06-22 ENCOUNTER — Ambulatory Visit: Payer: Self-pay | Admitting: General Surgery

## 2023-04-13 ENCOUNTER — Other Ambulatory Visit: Payer: Self-pay | Admitting: Nurse Practitioner

## 2023-04-13 DIAGNOSIS — Z1231 Encounter for screening mammogram for malignant neoplasm of breast: Secondary | ICD-10-CM

## 2023-05-17 ENCOUNTER — Ambulatory Visit
Admission: RE | Admit: 2023-05-17 | Discharge: 2023-05-17 | Disposition: A | Discharge status: Home / Self Care | Payer: BC Managed Care – PPO | Source: Ambulatory Visit | Attending: Nurse Practitioner | Admitting: Nurse Practitioner

## 2023-05-17 DIAGNOSIS — Z1231 Encounter for screening mammogram for malignant neoplasm of breast: Secondary | ICD-10-CM

## 2023-11-10 ENCOUNTER — Ambulatory Visit: Admitting: Gastroenterology

## 2023-11-10 ENCOUNTER — Encounter: Payer: Self-pay | Admitting: Gastroenterology

## 2023-11-10 VITALS — BP 110/70 | HR 96 | Ht 61.25 in | Wt 205.1 lb

## 2023-11-10 DIAGNOSIS — K5904 Chronic idiopathic constipation: Secondary | ICD-10-CM

## 2023-11-10 DIAGNOSIS — R143 Flatulence: Secondary | ICD-10-CM

## 2023-11-10 NOTE — Progress Notes (Signed)
 Chief Complaint: Bloating, abdominal discomfort Primary GI Doctor:Dr. Rosaline Coma  HPI:  Patient is a 48 year old female patient with past medical history of arthritis, depression, migraines, seizures,who was self referred for a complaint of bloating and abdominal discomfort.Patient was last seen in GI office by Dr. Rosaline Coma for colon cancer screening colonoscopy on 04/22/2022.  Interval History    Patient presents with main complaint of constipation and bloating. Patient states since her hysterectomy in (02/2023) she has had to take daily OTC Miralax and smooth move tea to help move her bowels. She also has a lot of flatulence. She hasn't had bowel movement since Saturday.  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 She states she has abdominal discomfort when laying down in lower abdomen.  She has incorporated more vegetables into her diet and cut out a lot of meat meat. No blood in stool. No alcohol use. Nonsmoker.  Wt Readings from Last 3 Encounters:  11/10/23 205 lb 2 oz (93 kg)  04/22/22 198 lb (89.8 kg)  04/01/22 198 lb (89.8 kg)    Past Medical History:  Diagnosis Date   Abnormal Pap smear    Arthritis 2020   lower back pain   Cervical polyp    Complication of anesthesia 02/03/2022   Patient states that she sometimes takes longer to wake up. She states that she experiences chills and shaking after anesthesia.   Depression    resolved as of 02/03/22   Migraines    Takes Maxalt  as needed per pt on 02/03/22.   Morbid obesity (HCC)    Prediabetes    Seizures (HCC)    Patient stated that after having toxemia with pregnancy in 2002 she had episodes of dizziness and blacking out. She states that she went to a neurologist and was told she was having some type of seizure. She took meds for a short while. As of 02/03/22, pt states that she has not had a seizure since 2002 and she hasn't taken any seizure meds in years. She does not recall the neurologist or meds.   Toxemia in  pregnancy    Umbilical hernia    SOME ABDOMINAL DISCOMFORT, s/p hernia repair   UTI (urinary tract infection)    ON MEDICATION AS OF 6/10 FOR UTI - HX OF FREQUENT UTI'S   Vitamin D  deficiency     Past Surgical History:  Procedure Laterality Date   CESAREAN SECTION     1993 and 2002   CRYOTHERAPY  1993   cervix   ENDOMETRIAL ABLATION     around 2016   ROBOTIC ASSISTED LAPAROSCOPIC HYSTERECTOMY AND SALPINGECTOMY N/A 02/12/2022   Procedure: XI ROBOTIC ASSISTED LAPAROSCOPIC HYSTERECTOMY AND SALPINGECTOMY, LYSIS OF ADHESIONS;  Surgeon: Ivery Marking, MD;  Location: [facility];  Service: Gynecology;  Laterality: N/A;   TUBAL LIGATION  2002   UMBILICAL HERNIA REPAIR N/A 12/27/2013   Procedure: HERNIA REPAIR UMBILICAL ADULT;  Surgeon: Rogena Class, MD;  Location: [facility];  Service: General;  Laterality: N/A;   WISDOM TOOTH EXTRACTION  2007    Current Outpatient Medications  Medication Sig Dispense Refill   Adapalene 0.3 % gel      Bacillus Coagulans-Inulin (PROBIOTIC FORMULA PO) Take by mouth. Olly for gut.     baclofen (LIORESAL) 10 MG tablet Take 10 mg by mouth as needed.     benzonatate (TESSALON) 200 MG capsule Take 200 mg by mouth 3 (three) times daily as needed.  Cholecalciferol  (VITAMIN D3) 125 MCG (5000 UT) CAPS Take 1 capsule (5,000 Units total) by mouth daily. 90 capsule 3   clindamycin (CLEOCIN T) 1 % lotion      diclofenac Sodium (VOLTAREN) 1 % GEL Apply 2 g topically as needed.     DIGESTIVE ENZYMES PO Take 1 capsule by mouth daily.     EC-NAPROXEN  500 MG EC tablet Take 500 mg by mouth 2 (two) times daily as needed.     furosemide (LASIX) 40 MG tablet Take 40 mg by mouth daily.     hydrochlorothiazide  (MICROZIDE ) 12.5 MG capsule TAKE ONE CAPSULE BY MOUTH DAILY 90 capsule 2   ibuprofen  (ADVIL ) 600 MG tablet Take 1 tablet (600 mg total) by mouth every 6 (six) hours as needed. 30 tablet 11   phentermine  (ADIPEX-P ) 37.5 MG tablet Take 37.5 mg by  mouth daily before breakfast.     potassium chloride  SA (KLOR-CON  M) 20 MEQ tablet Take 2 tablets (40 mEq total) by mouth 2 (two) times daily for 3 days. 12 tablet 0   Probiotic Product (UP4 PROBIOTICS ADULT PO) Take by mouth. Prebiotic and Probiotic w/ cranberry from [employer].     rizatriptan  (MAXALT ) 10 MG tablet Take 1 tablet (10 mg total) by mouth once as needed for migraine. Seleen Walter repeat in 2 hours if needed 30 tablet 2   rosuvastatin  (CRESTOR ) 5 MG tablet TAKE ONE TABLET BY MOUTH DAILY 90 tablet 2   No current facility-administered medications for this visit.    Allergies as of 11/10/2023 - Review Complete 11/10/2023  Allergen Reaction Noted   Shellfish allergy Itching 02/12/2022    Family History  Problem Relation Age of Onset   Hypertension Mother    Arthritis Mother    Hypertension Brother    Breast cancer Maternal Aunt        around 71   Breast cancer Paternal Aunt    Asthma Daughter    Anesthesia problems Neg Hx    Hypotension Neg Hx    Malignant hyperthermia Neg Hx    Pseudochol deficiency Neg Hx    Colon cancer Neg Hx    Colon polyps Neg Hx    Esophageal cancer Neg Hx    Stomach cancer Neg Hx    Rectal cancer Neg Hx     Review of Systems:    Constitutional: No weight loss, fever, chills, weakness or fatigue HEENT: Eyes: No change in vision               Ears, Nose, Throat:  No change in hearing or congestion Skin: No rash or itching Cardiovascular: No chest pain, chest pressure or palpitations   Respiratory: No SOB or cough Gastrointestinal: See HPI and otherwise negative Genitourinary: No dysuria or change in urinary frequency Neurological: No headache, dizziness or syncope Musculoskeletal: No new muscle or joint pain Hematologic: No bleeding or bruising Psychiatric: No history of depression or anxiety    Physical Exam:  Vital signs: BP 110/70 (BP Location: Left Arm, Patient Position: Sitting, Cuff Size: Large)   Pulse 96   Ht 5' 1.25" (1.556 m)  Comment: height measured without shoes  Wt 205 lb 2 oz (93 kg)   BMI 38.44 kg/m   Constitutional:   Pleasant female appears to be in NAD, Well developed, Well nourished, alert and cooperative Throat: Oral cavity and pharynx without inflammation, swelling or lesion.  Respiratory: Respirations even and unlabored. Lungs clear to auscultation bilaterally.   No wheezes, crackles, or rhonchi.  Cardiovascular: Normal  S1, S2. Regular rate and rhythm. No peripheral edema, cyanosis or pallor.  Gastrointestinal:  Soft, nondistended, nontender. No rebound or guarding. Hypoactive bowel sounds. No appreciable masses or hepatomegaly. Rectal:  Not performed.  Msk:  Symmetrical without gross deformities. Without edema, no deformity or joint abnormality.  Neurologic:  Alert and  oriented x4;  grossly normal neurologically.  Skin:   Dry and intact without significant lesions or rashes. Psychiatric: Oriented to person, place and time. Demonstrates good judgement and reason without abnormal affect or behaviors.  RELEVANT LABS AND IMAGING: CBC    Latest Ref Rng & Units 02/13/2022    2:22 AM 02/10/2022    2:56 PM 04/22/2020    2:30 PM  CBC  WBC 4.0 - 10.5 K/uL 14.8  10.6  9.4   Hemoglobin 12.0 - 15.0 g/dL 81.1  91.4  78.2   Hematocrit 36.0 - 46.0 % 31.8  37.0  37.9   Platelets 150 - 400 K/uL 262  298  312      CMP     Latest Ref Rng & Units 02/13/2022    2:22 AM 02/10/2022    2:56 PM 04/22/2020    2:30 PM  CMP  Glucose 70 - 99 mg/dL 956  99  76   BUN 6 - 20 mg/dL 12  11  10    Creatinine 0.44 - 1.00 mg/dL 2.13  0.86  5.78   Sodium 135 - 145 mmol/L 138  137  138   Potassium 3.5 - 5.1 mmol/L 3.4  3.1  3.3   Chloride 98 - 111 mmol/L 107  104  101   CO2 22 - 32 mmol/L 26  26  29    Calcium  8.9 - 10.3 mg/dL 8.3  9.2  9.2   Total Protein 6.1 - 8.1 g/dL   7.1   Total Bilirubin 0.2 - 1.2 mg/dL   0.5   AST 10 - 30 U/L   22   ALT 6 - 29 U/L   30      Lab Results  Component Value Date   TSH 1.23 10/25/2019   04/22/2022 colonoscopy with Dr. Rosaline Coma, recall 3 years Impression:  - The examined portion of the ileum was normal.  - Five 3 to 10 mm polyps in the transverse colon, in the ascending colon and at the ileocecal valve, removed with a cold snare. Resected and retrieved.  - One 3 mm polyp in the sigmoid colon, removed with a cold snare. Resected and retrieved. - Non- bleeding internal hemorrhoids. Path:  Diagnosis 1. Surgical [P], small bowel, ileocecal valve, ascending, transverse, polyp (5) - TUBULAR ADENOMA, FRAGMENTS. 2. Surgical [P], colon, sigmoid, polyp (1) - HYPERPLASTIC POLYP WITH A PROMINENT LYMPHOID AGGREGATE. Assessment: Encounter Diagnoses  Name Primary?   Chronic idiopathic constipation Yes   Flatulence       48 year old African-American female patient that presents with constipation and bloating after hysterectomy she had last August.   Patient has trialed and failed over-the-counter MiraLAX with smooth move tea.  Will provide samples of Linzess as a pro secretory agent as well as educated patient on high-fiber diet with adequate fluid intake. We discussed small meals throughout the day. Encourage her to walk after dinner. Patient has had colonoscopy back in October 2023 that revealed multiple colonic polyps with recall colonoscopy due 04/2025.  Hopefully if we get her bowels moving this will help with both the bloating and the discomfort.  If her symptoms continue Jen Eppinger consider imaging.  Plan: - Recommend High fiber  diet  -Start Citrucel 1 tsp po daily  -samples of Linzess 72 mcg po daily provided  -History of tubular adenoma, recall colonoscopy 04/2025  Thank you for the courtesy of this consult. Please call me with any questions or concerns.   Cathyrn Deas, FNP-C [facility] 11/10/2023, 3:57 PM  Cc: Bari Boos, FNP

## 2023-11-10 NOTE — Patient Instructions (Addendum)
 High fiber diet -pamphlet handed out Start OTC Citrucel 1 tsp po daily  Samples provided of Linzess 72 mcg po daily 1 tablet 30-45 minutes before first meal of day with full glass of water . You may have loose stools the first week you start medication but should imrpove. Please contact office if it works well and you would like prescription.   _______________________________________________________  If your blood pressure at your visit was 140/90 or greater, please contact your primary care physician to follow up on this.  _______________________________________________________  If you are age 30 or older, your body mass index should be between 23-30. Your Body mass index is 38.44 kg/m. If this is out of the aforementioned range listed, please consider follow up with your Primary Care Provider.  If you are age 40 or younger, your body mass index should be between 19-25. Your Body mass index is 38.44 kg/m. If this is out of the aformentioned range listed, please consider follow up with your Primary Care Provider.   ________________________________________________________  The [facility] providers would like to encourage you to use MYCHART to communicate with providers for non-urgent requests or questions.  Due to long hold times on the telephone, sending your provider a message by [facility] may be a faster and more efficient way to get a response.  Please allow 48 business hours for a response.  Please remember that this is for non-urgent requests.  _______________________________________________________ Thank you for trusting me with your gastrointestinal care!   Dyanna Glasgow, RNP

## 2023-11-10 NOTE — Progress Notes (Signed)
 I agree with the assessment and plan as outlined by Kendra Stein.

## 2023-11-18 ENCOUNTER — Telehealth: Payer: Self-pay | Admitting: Gastroenterology

## 2023-11-18 ENCOUNTER — Other Ambulatory Visit: Payer: Self-pay

## 2023-11-18 NOTE — Telephone Encounter (Signed)
 Patient stated Linzess has been working well for her and would prescription sent to her pharmacy. Please advise, thank you.

## 2023-11-21 ENCOUNTER — Other Ambulatory Visit: Payer: Self-pay | Admitting: Gastroenterology

## 2023-11-21 DIAGNOSIS — K5904 Chronic idiopathic constipation: Secondary | ICD-10-CM

## 2023-11-21 MED ORDER — LINACLOTIDE 72 MCG PO CAPS: 72.0000 ug | ORAL_CAPSULE | Freq: Every day | ORAL | 3 refills | Status: AC

## 2023-11-21 NOTE — Progress Notes (Signed)
 Linzess 72mcg rx sent per patient request

## 2024-06-11 ENCOUNTER — Other Ambulatory Visit: Payer: Self-pay | Admitting: Nurse Practitioner

## 2024-06-11 DIAGNOSIS — Z1231 Encounter for screening mammogram for malignant neoplasm of breast: Secondary | ICD-10-CM

## 2024-07-06 ENCOUNTER — Ambulatory Visit
Admission: RE | Admit: 2024-07-06 | Discharge: 2024-07-06 | Disposition: A | Discharge status: Home / Self Care | Source: Ambulatory Visit | Attending: Nurse Practitioner | Admitting: Nurse Practitioner

## 2024-07-06 DIAGNOSIS — Z1231 Encounter for screening mammogram for malignant neoplasm of breast: Secondary | ICD-10-CM
